# Patient Record
Sex: Male | Born: 1950
Health system: Southern US, Community
[De-identification: ages and names within clinical notes are randomized; demographics above are authoritative.]

## PROBLEM LIST (undated history)

## (undated) DIAGNOSIS — M1990 Unspecified osteoarthritis, unspecified site: Secondary | ICD-10-CM

## (undated) DIAGNOSIS — M795 Residual foreign body in soft tissue: Secondary | ICD-10-CM

## (undated) DIAGNOSIS — G4733 Obstructive sleep apnea (adult) (pediatric): Secondary | ICD-10-CM

## (undated) DIAGNOSIS — I10 Essential (primary) hypertension: Secondary | ICD-10-CM

## (undated) DIAGNOSIS — M109 Gout, unspecified: Secondary | ICD-10-CM

## (undated) DIAGNOSIS — Z9989 Dependence on other enabling machines and devices: Secondary | ICD-10-CM

## (undated) DIAGNOSIS — I509 Heart failure, unspecified: Secondary | ICD-10-CM

## (undated) DIAGNOSIS — K254 Chronic or unspecified gastric ulcer with hemorrhage: Secondary | ICD-10-CM

## (undated) SURGERY — Surgical Case
Anesthesia: *Unknown

---

## 1989-01-31 DIAGNOSIS — K254 Chronic or unspecified gastric ulcer with hemorrhage: Secondary | ICD-10-CM

## 1989-01-31 HISTORY — DX: Chronic or unspecified gastric ulcer with hemorrhage: K25.4

## 1989-01-31 HISTORY — PX: CHOLECYSTECTOMY: SHX55

## 2002-07-27 ENCOUNTER — Emergency Department (HOSPITAL_COMMUNITY): Admission: EM | Admit: 2002-07-27 | Discharge: 2002-07-27 | Payer: Self-pay | Admitting: Emergency Medicine

## 2011-05-05 ENCOUNTER — Ambulatory Visit (INDEPENDENT_AMBULATORY_CARE_PROVIDER_SITE_OTHER): Payer: Self-pay | Admitting: Surgery

## 2013-01-13 DIAGNOSIS — L723 Sebaceous cyst: Secondary | ICD-10-CM | POA: Insufficient documentation

## 2013-01-13 DIAGNOSIS — B070 Plantar wart: Secondary | ICD-10-CM | POA: Insufficient documentation

## 2013-06-08 ENCOUNTER — Inpatient Hospital Stay (HOSPITAL_COMMUNITY)
Admission: EM | Admit: 2013-06-08 | Discharge: 2013-06-12 | DRG: 287 | Disposition: A | Discharge status: Home / Self Care | Payer: Self-pay | Attending: Family Medicine | Admitting: Family Medicine

## 2013-06-08 ENCOUNTER — Encounter (HOSPITAL_COMMUNITY): Payer: Self-pay | Admitting: Cardiology

## 2013-06-08 ENCOUNTER — Emergency Department (HOSPITAL_COMMUNITY): Payer: Self-pay

## 2013-06-08 DIAGNOSIS — E785 Hyperlipidemia, unspecified: Secondary | ICD-10-CM | POA: Diagnosis present

## 2013-06-08 DIAGNOSIS — I4891 Unspecified atrial fibrillation: Secondary | ICD-10-CM

## 2013-06-08 DIAGNOSIS — E669 Obesity, unspecified: Secondary | ICD-10-CM | POA: Diagnosis present

## 2013-06-08 DIAGNOSIS — F411 Generalized anxiety disorder: Secondary | ICD-10-CM | POA: Diagnosis present

## 2013-06-08 DIAGNOSIS — D696 Thrombocytopenia, unspecified: Secondary | ICD-10-CM | POA: Diagnosis present

## 2013-06-08 DIAGNOSIS — I509 Heart failure, unspecified: Secondary | ICD-10-CM

## 2013-06-08 DIAGNOSIS — I428 Other cardiomyopathies: Secondary | ICD-10-CM | POA: Diagnosis present

## 2013-06-08 DIAGNOSIS — Z79899 Other long term (current) drug therapy: Secondary | ICD-10-CM

## 2013-06-08 DIAGNOSIS — I5033 Acute on chronic diastolic (congestive) heart failure: Principal | ICD-10-CM | POA: Diagnosis present

## 2013-06-08 DIAGNOSIS — Z7982 Long term (current) use of aspirin: Secondary | ICD-10-CM

## 2013-06-08 DIAGNOSIS — I10 Essential (primary) hypertension: Secondary | ICD-10-CM | POA: Diagnosis present

## 2013-06-08 DIAGNOSIS — Z23 Encounter for immunization: Secondary | ICD-10-CM

## 2013-06-08 DIAGNOSIS — I059 Rheumatic mitral valve disease, unspecified: Secondary | ICD-10-CM | POA: Diagnosis present

## 2013-06-08 DIAGNOSIS — I2510 Atherosclerotic heart disease of native coronary artery without angina pectoris: Secondary | ICD-10-CM | POA: Diagnosis present

## 2013-06-08 DIAGNOSIS — G4730 Sleep apnea, unspecified: Secondary | ICD-10-CM | POA: Diagnosis present

## 2013-06-08 DIAGNOSIS — G4733 Obstructive sleep apnea (adult) (pediatric): Secondary | ICD-10-CM | POA: Diagnosis present

## 2013-06-08 HISTORY — DX: Chronic or unspecified gastric ulcer with hemorrhage: K25.4

## 2013-06-08 HISTORY — DX: Heart failure, unspecified: I50.9

## 2013-06-08 HISTORY — DX: Obstructive sleep apnea (adult) (pediatric): G47.33

## 2013-06-08 HISTORY — DX: Gout, unspecified: M10.9

## 2013-06-08 HISTORY — DX: Dependence on other enabling machines and devices: Z99.89

## 2013-06-08 HISTORY — DX: Unspecified osteoarthritis, unspecified site: M19.90

## 2013-06-08 HISTORY — DX: Essential (primary) hypertension: I10

## 2013-06-08 LAB — URINALYSIS, ROUTINE W REFLEX MICROSCOPIC
Bilirubin Urine: NEGATIVE
GLUCOSE, UA: NEGATIVE mg/dL
HGB URINE DIPSTICK: NEGATIVE
Ketones, ur: NEGATIVE mg/dL
LEUKOCYTES UA: NEGATIVE
Nitrite: NEGATIVE
PH: 5 (ref 5.0–8.0)
PROTEIN: 30 mg/dL — AB
SPECIFIC GRAVITY, URINE: 1.019 (ref 1.005–1.030)
Urobilinogen, UA: 0.2 mg/dL (ref 0.0–1.0)

## 2013-06-08 LAB — TROPONIN I: Troponin I: <0.3 ng/mL (ref <0.30)

## 2013-06-08 LAB — GLUCOSE, CAPILLARY: Glucose-Capillary: 109 mg/dL — ABNORMAL HIGH (ref 70–99)

## 2013-06-08 LAB — CBC WITH DIFFERENTIAL/PLATELET
BASOS ABS: 0 10*3/uL (ref 0.0–0.1)
Basophils Relative: 0 % (ref 0–1)
EOS PCT: 1 % (ref 0–5)
Eosinophils Absolute: 0.1 10*3/uL (ref 0.0–0.7)
HCT: 42.5 % (ref 39.0–52.0)
Hemoglobin: 14.8 g/dL (ref 13.0–17.0)
Lymphocytes Relative: 17 % (ref 12–46)
Lymphs Abs: 1.2 10*3/uL (ref 0.7–4.0)
MCH: 29.9 pg (ref 26.0–34.0)
MCHC: 34.8 g/dL (ref 30.0–36.0)
MCV: 85.9 fL (ref 78.0–100.0)
Monocytes Absolute: 0.6 10*3/uL (ref 0.1–1.0)
Monocytes Relative: 9 % (ref 3–12)
NEUTROS ABS: 5.3 10*3/uL (ref 1.7–7.7)
NEUTROS PCT: 73 % (ref 43–77)
PLATELETS: 115 10*3/uL — AB (ref 150–400)
RBC: 4.95 MIL/uL (ref 4.22–5.81)
RDW: 13.7 % (ref 11.5–15.5)
WBC: 7.2 10*3/uL (ref 4.0–10.5)

## 2013-06-08 LAB — URINE MICROSCOPIC-ADD ON

## 2013-06-08 LAB — BASIC METABOLIC PANEL
BUN: 17 mg/dL (ref 6–23)
CALCIUM: 8.6 mg/dL (ref 8.4–10.5)
CHLORIDE: 106 meq/L (ref 96–112)
CO2: 25 mEq/L (ref 19–32)
Creatinine, Ser: 0.78 mg/dL (ref 0.50–1.35)
GFR calc Af Amer: >90 mL/min (ref >90)
GFR calc non Af Amer: >90 mL/min (ref >90)
GLUCOSE: 110 mg/dL — AB (ref 70–99)
POTASSIUM: 3.8 meq/L (ref 3.7–5.3)
Sodium: 145 mEq/L (ref 137–147)

## 2013-06-08 LAB — PRO B NATRIURETIC PEPTIDE: PRO B NATRI PEPTIDE: 2698 pg/mL — AB (ref 0–125)

## 2013-06-08 LAB — TSH: TSH: 3.942 u[IU]/mL (ref 0.350–4.500)

## 2013-06-08 MED ORDER — FUROSEMIDE 20 MG PO TABS: 40.0000 mg | ORAL_TABLET | Freq: Every day | ORAL | Status: DC

## 2013-06-08 MED ORDER — METOPROLOL TARTRATE 25 MG PO TABS
25.0000 mg | ORAL_TABLET | Freq: Two times a day (BID) | ORAL | Status: DC
Start: 2013-06-08 — End: 2013-06-08
  Administered 2013-06-08: 25 mg via ORAL
  Filled 2013-06-08 (×2): qty 1

## 2013-06-08 MED ORDER — SODIUM CHLORIDE 0.9 % IV SOLN: 250.0000 mL | INTRAVENOUS | Status: DC | PRN

## 2013-06-08 MED ORDER — SODIUM CHLORIDE 0.9 % IJ SOLN: 3.0000 mL | INTRAMUSCULAR | Status: DC | PRN

## 2013-06-08 MED ORDER — FUROSEMIDE 10 MG/ML IJ SOLN
40.0000 mg | Freq: Every day | INTRAMUSCULAR | Status: DC
  Administered 2013-06-08: 40 mg via INTRAVENOUS
  Filled 2013-06-08 (×2): qty 4

## 2013-06-08 MED ORDER — APIXABAN 5 MG PO TABS
5.0000 mg | ORAL_TABLET | Freq: Two times a day (BID) | ORAL | Status: DC
Start: 2013-06-08 — End: 2013-06-09
  Administered 2013-06-08 – 2013-06-09 (×3): 5 mg via ORAL
  Filled 2013-06-08 (×6): qty 1

## 2013-06-08 MED ORDER — ASPIRIN 81 MG PO CHEW
162.0000 mg | CHEWABLE_TABLET | Freq: Once | ORAL | Status: AC
  Administered 2013-06-08: 162 mg via ORAL
  Filled 2013-06-08: qty 2

## 2013-06-08 MED ORDER — PNEUMOCOCCAL VAC POLYVALENT 25 MCG/0.5ML IJ INJ
0.5000 mL | INJECTION | INTRAMUSCULAR | Status: AC
  Administered 2013-06-09: 0.5 mL via INTRAMUSCULAR
  Filled 2013-06-08: qty 0.5

## 2013-06-08 MED ORDER — ACETAMINOPHEN 325 MG PO TABS
650.0000 mg | ORAL_TABLET | ORAL | Status: DC | PRN
Start: 2013-06-08 — End: 2013-06-12

## 2013-06-08 MED ORDER — OMEGA-3-ACID ETHYL ESTERS 1 G PO CAPS
1.0000 g | ORAL_CAPSULE | Freq: Two times a day (BID) | ORAL | Status: DC
  Administered 2013-06-08 – 2013-06-12 (×8): 1 g via ORAL
  Filled 2013-06-08 (×9): qty 1

## 2013-06-08 MED ORDER — INFLUENZA VAC SPLIT QUAD 0.5 ML IM SUSP
0.5000 mL | INTRAMUSCULAR | Status: AC
  Administered 2013-06-09: 20:00:00 0.5 mL via INTRAMUSCULAR
  Filled 2013-06-08: qty 0.5

## 2013-06-08 MED ORDER — METOPROLOL SUCCINATE ER 50 MG PO TB24
50.0000 mg | ORAL_TABLET | Freq: Every day | ORAL | Status: DC
  Administered 2013-06-09: 50 mg via ORAL
  Filled 2013-06-08: qty 1

## 2013-06-08 MED ORDER — ONDANSETRON HCL 4 MG/2ML IJ SOLN
4.0000 mg | Freq: Four times a day (QID) | INTRAMUSCULAR | Status: DC | PRN
  Filled 2013-06-08: qty 2

## 2013-06-08 MED ORDER — FUROSEMIDE 40 MG PO TABS
40.0000 mg | ORAL_TABLET | Freq: Every day | ORAL | Status: DC
  Administered 2013-06-09: 11:00:00 40 mg via ORAL
  Filled 2013-06-08: qty 1

## 2013-06-08 MED ORDER — LISINOPRIL 2.5 MG PO TABS
2.5000 mg | ORAL_TABLET | Freq: Every day | ORAL | Status: DC
  Administered 2013-06-09: 2.5 mg via ORAL
  Filled 2013-06-08 (×2): qty 1

## 2013-06-08 MED ORDER — SODIUM CHLORIDE 0.9 % IJ SOLN
3.0000 mL | Freq: Two times a day (BID) | INTRAMUSCULAR | Status: DC
  Administered 2013-06-08 – 2013-06-12 (×7): 3 mL via INTRAVENOUS

## 2013-06-08 MED ORDER — ASPIRIN EC 81 MG PO TBEC
81.0000 mg | DELAYED_RELEASE_TABLET | Freq: Every day | ORAL | Status: DC
  Administered 2013-06-09: 11:00:00 81 mg via ORAL
  Filled 2013-06-08: qty 1

## 2013-06-08 NOTE — Discharge Planning (Signed)
L8HT Felicia E, Community Liaison Ext [phone]  Follow up appointment made for 07/13/13 at 3:30pm with the [facility] to establish primary care. Patient will also be obtaining the orange card during this visit, patient is aware of both appointments. Application and my contact information was given for any future questions or concerns.

## 2013-06-08 NOTE — ED Notes (Signed)
Admitting MD at the bedside.  

## 2013-06-08 NOTE — Consult Note (Signed)
Reason for Consult: CHF and Atrial Fibrillation w/ RVR Referring Physician: TRH   HPI: The patient is a 63 y/o male, with no PMH other than a PUD, who presented to the [facility] ER today for SOB and was found to be in acute CHF and atrial fibrillation with a rapid ventricular response. He does not have an established PCP and only goes to Urgent Care centers when needed. He denies any prior cardiac issues and no family history of CAD, arrythmias or CHF. He denies history of tobacco use. He does admit to a diet high in sodium. He eats a lot of processed foods, particularly hot dogs, and seasons his food with table salt. He reports that his SOB first began several weeks ago, accompanied by orthopnea/PND, weight gain (8-10 lbs), increased abdominal girth and LEE. He went to an Urgent Care and both his EKG and CXR were abnormal. He was prescribed both Metoprolol and Lasix and was instructed to establish care with a cardiologist. He states that he was waiting until his new insurance took effect before making an appointment. He took the lasix and his symptoms improve, however he recently ran out and had no refills.   He presents today with worsening SOB. EKG demonstrated atrial fibrillation with RVR. HR fluctuation in the 120s-130s. BP is stable w/ SBP in the 110s.  He denies chest pain and palpitations. He has had occasional dizziness, but no syncope/ near syncope. CXR demonstrates mild CHF w/ interstitial edema. BNP is elevated at 2,698. Troponin is negative x 1. The patient also denies any past history of CVA. He has had no further issues with PUD (not on a PPI). Denies any recent history of melena or hematochezia.    No past medical history on file.  No past surgical history on file.  Family History  Problem Relation Age of Onset  . Cancer    . Diabetes      Social History:  has no tobacco, alcohol, and drug history on file.  Allergies: No Known Allergies  Medications:  Prior to Admission  medications   Medication Sig Start Date End Date Taking? Authorizing Provider  aspirin EC 81 MG tablet Take 81 mg by mouth daily.   Yes Historical Provider, MD  furosemide (LASIX) 40 MG tablet Take 40 mg by mouth daily.   Yes Historical Provider, MD  metoprolol succinate (TOPROL-XL) 50 MG 24 hr tablet Take 50 mg by mouth daily. Take with or immediately following a meal.   Yes Historical Provider, MD  omega-3 acid ethyl esters (LOVAZA) 1 G capsule Take 1 g by mouth 2 (two) times daily.   Yes Historical Provider, MD     Results for orders placed during the hospital encounter of 06/08/13 (from the past 48 hour(s))  CBC WITH DIFFERENTIAL     Status: Abnormal   Collection Time    06/08/13  9:18 AM      Result Value Range   WBC 7.2  4.0 - 10.5 K/uL   RBC 4.95  4.22 - 5.81 MIL/uL   Hemoglobin 14.8  13.0 - 17.0 g/dL   HCT 42.5  39.0 - 52.0 %   MCV 85.9  78.0 - 100.0 fL   MCH 29.9  26.0 - 34.0 pg   MCHC 34.8  30.0 - 36.0 g/dL   RDW 13.7  11.5 - 15.5 %   Platelets 115 (*) 150 - 400 K/uL   Comment: PLATELET COUNT CONFIRMED BY SMEAR   Neutrophils Relative % 73  43 -  77 %   Neutro Abs 5.3  1.7 - 7.7 K/uL   Lymphocytes Relative 17  12 - 46 %   Lymphs Abs 1.2  0.7 - 4.0 K/uL   Monocytes Relative 9  3 - 12 %   Monocytes Absolute 0.6  0.1 - 1.0 K/uL   Eosinophils Relative 1  0 - 5 %   Eosinophils Absolute 0.1  0.0 - 0.7 K/uL   Basophils Relative 0  0 - 1 %   Basophils Absolute 0.0  0.0 - 0.1 K/uL  BASIC METABOLIC PANEL     Status: Abnormal   Collection Time    06/08/13  9:18 AM      Result Value Range   Sodium 145  137 - 147 mEq/L   Potassium 3.8  3.7 - 5.3 mEq/L   Chloride 106  96 - 112 mEq/L   CO2 25  19 - 32 mEq/L   Glucose, Bld 110 (*) 70 - 99 mg/dL   BUN 17  6 - 23 mg/dL   Creatinine, Ser 0.78  0.50 - 1.35 mg/dL   Calcium 8.6  8.4 - 10.5 mg/dL   GFR calc non Af Amer >90  >90 mL/min   GFR calc Af Amer >90  >90 mL/min   Comment: (NOTE)     The eGFR has been calculated using the CKD  EPI equation.     This calculation has not been validated in all clinical situations.     eGFR's persistently <90 mL/min signify possible Chronic Kidney     Disease.  TROPONIN I     Status: None   Collection Time    06/08/13  9:18 AM      Result Value Range   Troponin I <0.30  <0.30 ng/mL   Comment:            Due to the release kinetics of cTnI,     a negative result within the first hours     of the onset of symptoms does not rule out     myocardial infarction with certainty.     If myocardial infarction is still suspected,     repeat the test at appropriate intervals.  PRO B NATRIURETIC PEPTIDE     Status: Abnormal   Collection Time    06/08/13  9:18 AM      Result Value Range   Pro B Natriuretic peptide (BNP) 2698.0 (*) 0 - 125 pg/mL  URINALYSIS, ROUTINE W REFLEX MICROSCOPIC     Status: Abnormal   Collection Time    06/08/13  9:45 AM      Result Value Range   Color, Urine YELLOW  YELLOW   APPearance CLEAR  CLEAR   Specific Gravity, Urine 1.019  1.005 - 1.030   pH 5.0  5.0 - 8.0   Glucose, UA NEGATIVE  NEGATIVE mg/dL   Hgb urine dipstick NEGATIVE  NEGATIVE   Bilirubin Urine NEGATIVE  NEGATIVE   Ketones, ur NEGATIVE  NEGATIVE mg/dL   Protein, ur 30 (*) NEGATIVE mg/dL   Urobilinogen, UA 0.2  0.0 - 1.0 mg/dL   Nitrite NEGATIVE  NEGATIVE   Leukocytes, UA NEGATIVE  NEGATIVE  URINE MICROSCOPIC-ADD ON     Status: None   Collection Time    06/08/13  9:45 AM      Result Value Range   WBC, UA 3-6  <3 WBC/hpf   RBC / HPF 0-2  <3 RBC/hpf   Bacteria, UA RARE  RARE    Dg Chest  2 View  06/08/2013   CLINICAL DATA:  Short of breath.  Chest pain.  EXAM: CHEST  2 VIEW  COMPARISON:  None.  FINDINGS: Cardiomegaly and pulmonary vascular congestion are present with cephalization of pulmonary blood flow. No focal consolidation is identified. Thickening of the fissures is noted on the lateral view and there are Kerley B lines at the periphery of the lung, compatible with interstitial  pulmonary edema.  IMPRESSION: Mild CHF with interstitial pulmonary edema.   Electronically Signed   By: Dereck Ligas M.D.   On: 06/08/2013 10:10    Review of Systems  Respiratory: Positive for shortness of breath.   Cardiovascular: Positive for orthopnea, leg swelling and PND. Negative for chest pain and palpitations.  Gastrointestinal: Negative for blood in stool and melena.  Genitourinary: Negative for hematuria.  Neurological: Positive for dizziness. Negative for loss of consciousness.  All other systems reviewed and are negative.   Blood pressure 116/97, pulse 124, temperature 97.8 F (36.6 C), temperature source Oral, resp. rate 20, height 5' 11" (1.803 m), weight 235 lb (106.595 kg), SpO2 96.00%. Physical Exam  Constitutional: He is oriented to person, place, and time. He appears well-developed and well-nourished. No distress.  Neck: No JVD present. Carotid bruit is not present.  Cardiovascular: An irregularly irregular rhythm present. Tachycardia present.   Pulses:      Radial pulses are 2+ on the right side, and 2+ on the left side.       Dorsalis pedis pulses are 2+ on the right side, and 2+ on the left side.  Respiratory: Effort normal and breath sounds normal. No respiratory distress. He has no wheezes. He has no rales.  GI: Soft. Bowel sounds are normal. He exhibits no distension and no mass. There is no tenderness.  Musculoskeletal: He exhibits edema (trace bilateral LE).  Neurological: He is alert and oriented to person, place, and time.  Skin: Skin is warm and dry. He is not diaphoretic.  Psychiatric: He has a normal mood and affect. His behavior is normal.    Assessment/Plan: Active Problems:   Acute congestive heart failure   Acute CHF   Atrial fibrillation with rapid ventricular response  Plan: 62 y/o male with no known prior medical history, other than PUD years ago, who hasn't been followed by a PCP, presenting to ER with several weeks of SOB, orthopnea/PNA,  weight gain and LEE, found to be in CHF and rapid atrial fibrillation. Pt to be admitted by [facility]. Continue BID Lopressor. We will add Eliquis for anticoagulation. Cycle troponins x 3. Check TSH. We will order a 2 D echo to assess both systolic and diastolic function. CPAP for sleep apnea.   Also treat CHF. He does not appear extremely volume overloaded. His renal function is normal with SCr of 0.78. We will start 40 mg PO Lasix in the am. Order strict  I/Os. Daily weights. Recheck BNP in 1-2 days. Low sodium diet. Recommend consult for dietician. Also consider checking Hgb A1c and lipid panel in the am.   BRITTAINY SIMMONS, PA-C 06/08/2013, 2:33 PM   Attending Note:   The patient was seen and examined.  Agree with assessment and plan as noted above.  Changes made to the above note as needed.  1. Acute on chronic   CHF:   At this point we do not know whether this is systolic or diastolic CHF.  I suspect at least a good bit of his symptoms are due to diastolic dysfunction.   He  Has had symptoms for > 1 year.  His symptoms worsened when he ran out of his meds and did not go back to the doctor for refills.    His CXR shows cardiomegaly but no significant pulmonary edema. He does not have significant rales or peripheral edema on exam today. His exam is not very remarkable for CHF.   Restart lasix as you  Have done Restart metoprolol for rate control and CHF.  Consider ACE-inhibitor. Echo tomorrow.   He will need to follow up with me in the office after discharge.    2. Atrial Fib:  His CHADS/VASC 2 score is ~1 (CHF).  We do not have a complete assessment yet .  His glucose is only mildly elevated.  I would get a HbA1c to determine if he has DM.   3. Obesity:  He needs to work on a better diet and exercise plan.   4. OSA:  He should be placed on his own BIPAP if possible. Otherwise, he can use one from RT.    Thayer Headings, Brooke Bonito., MD, [facility] 06/08/2013, 4:51 PM

## 2013-06-08 NOTE — ED Notes (Signed)
Patient is resting comfortably. 

## 2013-06-08 NOTE — ED Notes (Signed)
Pt given [place] sandwich and coke. Waiting for admitting MD.

## 2013-06-08 NOTE — ED Notes (Signed)
Pt state he was seen at [facility] yesterday for SOB. Pt states that they did an EKG that showed he was having heart trouble. Pt tonight started having SOB and was unable to lay down to sleep.

## 2013-06-08 NOTE — H&P (Signed)
[facility] History and Physical  CLEBERT WENGER IOX:[id number] DOB: [birth year] DOA: 06/08/2013  Referring physician: EDP PCP: No PCP Per Patient   Chief Complaint: SOB FOR OVER one month  HPI: [name] is a 63 y.o. male with recently diagnosed CHF, and atrial fibrillation, was evaluated at urgent care for the above on 12/18, was discharged on b blockers and lasix, aspirin, comes in today for worsening sob, with activity, associated with orthopnea and PND. He reports mild pedal edema, which has resolved after taking lasix. He denies any chest pain, palpitations or syncope. Denies family h/o of heart disease. He denies smoking or alcohol in take. On arrival to ED, he was found to be in atrial fib with rate between 100 to 120, and his CXR revealed mild chf with interstitial edema. He is on RA with good oxygen sats.    Review of Systems:  See hpi otherwise negative.   No past medical history on file. No past surgical history on file. Social History:  has no tobacco, alcohol, and drug history on file.  No Known Allergies  No family history on file.   Prior to Admission medications   Medication Sig Start Date End Date Taking? Authorizing Provider  aspirin EC 81 MG tablet Take 81 mg by mouth daily.   Yes Historical Provider, MD  furosemide (LASIX) 40 MG tablet Take 40 mg by mouth daily.   Yes Historical Provider, MD  metoprolol succinate (TOPROL-XL) 50 MG 24 hr tablet Take 50 mg by mouth daily. Take with or immediately following a meal.   Yes Historical Provider, MD  omega-3 acid ethyl esters (LOVAZA) 1 G capsule Take 1 g by mouth 2 (two) times daily.   Yes Historical Provider, MD   Physical Exam: Filed Vitals:   06/08/13 1200  BP: 143/67  Pulse: 58  Temp:   Resp: 17    BP 143/67  Pulse 58  Temp(Src) 97.8 F (36.6 C) (Oral)  Resp 17  Ht 5\' 11"  (1.803 m)  Wt 106.595 kg (235 lb)  BMI 32.79 kg/m2  SpO2 93%  General:  Appears calm and comfortable Eyes: PERRL,  normal lids, irises & conjunctiva ENT: grossly normal hearing, lips & tongue Neck: no LAD, masses or thyromegaly Cardiovascular: irregular, no m/r/g. No LE edema. Respiratory: CTA bilaterally, no w/r/r. Normal respiratory effort. Abdomen: soft, ntnd Skin: no rash or induration seen on limited exam Musculoskeletal: grossly normal tone BUE/BLE Psychiatric: grossly normal mood and affect, speech fluent and appropriate Neurologic: grossly non-focal.          Labs on Admission:  Basic Metabolic Panel:  Recent Labs Lab 06/08/13 0918  NA 145  K 3.8  CL 106  CO2 25  GLUCOSE 110*  BUN 17  CREATININE 0.78  CALCIUM 8.6   Liver Function Tests: No results found for this basename: AST, ALT, ALKPHOS, BILITOT, PROT, ALBUMIN,  in the last 168 hours No results found for this basename: LIPASE, AMYLASE,  in the last 168 hours No results found for this basename: AMMONIA,  in the last 168 hours CBC:  Recent Labs Lab 06/08/13 0918  WBC 7.2  NEUTROABS 5.3  HGB 14.8  HCT 42.5  MCV 85.9  PLT 115*   Cardiac Enzymes:  Recent Labs Lab 06/08/13 0918  TROPONINI <0.30    BNP (last 3 results)  Recent Labs  06/08/13 0918  PROBNP 2698.0*   CBG: No results found for this basename: GLUCAP,  in the last 168 hours  Radiological Exams  on Admission: Dg Chest 2 View  06/08/2013   CLINICAL DATA:  Short of breath.  Chest pain.  EXAM: CHEST  2 VIEW  COMPARISON:  None.  FINDINGS: Cardiomegaly and pulmonary vascular congestion are present with cephalization of pulmonary blood flow. No focal consolidation is identified. Thickening of the fissures is noted on the lateral view and there are Kerley B lines at the periphery of the lung, compatible with interstitial pulmonary edema.  IMPRESSION: Mild CHF with interstitial pulmonary edema.   Electronically Signed   By: Dereck Ligas M.D.   On: 06/08/2013 10:10    EKG:  afib with rate of 107/min.  Assessment/Plan Active Problems:   Acute congestive  heart failure   Acute CHF   Acute congestive heart failure: - admit to telemetry. A fib probably driving the acute chf.  - start pt on IV lasix 40 mg daily,  - 2 D ECHO, troponins, . - cardiology consulted.   Atrial fibrillation with RVR  iv heparin,  - b blockers.   DVT prophylaxis.   Code Status: full code Family Communication: none at bedside, discussed the plan of care witht he patient.  Disposition Plan: admit to telemetry.  Time spent: 65 minutes.  [facility] Pager [phone]

## 2013-06-08 NOTE — ED Notes (Signed)
Report given to Emily, RN.

## 2013-06-09 ENCOUNTER — Encounter (HOSPITAL_COMMUNITY): Payer: Self-pay | Admitting: General Practice

## 2013-06-09 DIAGNOSIS — I059 Rheumatic mitral valve disease, unspecified: Secondary | ICD-10-CM

## 2013-06-09 DIAGNOSIS — I10 Essential (primary) hypertension: Secondary | ICD-10-CM | POA: Diagnosis present

## 2013-06-09 DIAGNOSIS — E785 Hyperlipidemia, unspecified: Secondary | ICD-10-CM | POA: Diagnosis present

## 2013-06-09 DIAGNOSIS — G4730 Sleep apnea, unspecified: Secondary | ICD-10-CM | POA: Diagnosis present

## 2013-06-09 LAB — LIPID PANEL
CHOL/HDL RATIO: 7.3 ratio
Cholesterol: 160 mg/dL (ref 0–200)
HDL: 22 mg/dL — ABNORMAL LOW (ref >39)
LDL CALC: 95 mg/dL (ref 0–99)
TRIGLYCERIDES: 214 mg/dL — AB (ref <150)
VLDL: 43 mg/dL — AB (ref 0–40)

## 2013-06-09 LAB — HEPATIC FUNCTION PANEL
ALBUMIN: 3.1 g/dL — AB (ref 3.5–5.2)
ALK PHOS: 68 U/L (ref 39–117)
ALT: 29 U/L (ref 0–53)
AST: 34 U/L (ref 0–37)
BILIRUBIN TOTAL: 0.7 mg/dL (ref 0.3–1.2)
Bilirubin, Direct: <0.2 mg/dL (ref 0.0–0.3)
TOTAL PROTEIN: 5.8 g/dL — AB (ref 6.0–8.3)

## 2013-06-09 LAB — MAGNESIUM: Magnesium: 2.2 mg/dL (ref 1.5–2.5)

## 2013-06-09 LAB — COMPREHENSIVE METABOLIC PANEL
ALBUMIN: 3.1 g/dL — AB (ref 3.5–5.2)
ALT: 29 U/L (ref 0–53)
AST: 34 U/L (ref 0–37)
Alkaline Phosphatase: 67 U/L (ref 39–117)
BILIRUBIN TOTAL: 0.7 mg/dL (ref 0.3–1.2)
BUN: 15 mg/dL (ref 6–23)
CHLORIDE: 101 meq/L (ref 96–112)
CO2: 19 mEq/L (ref 19–32)
Calcium: 8.4 mg/dL (ref 8.4–10.5)
Creatinine, Ser: 0.74 mg/dL (ref 0.50–1.35)
GFR calc Af Amer: >90 mL/min (ref >90)
GFR calc non Af Amer: >90 mL/min (ref >90)
Glucose, Bld: 98 mg/dL (ref 70–99)
Potassium: 3.5 mEq/L — ABNORMAL LOW (ref 3.7–5.3)
Sodium: 139 mEq/L (ref 137–147)
Total Protein: 5.9 g/dL — ABNORMAL LOW (ref 6.0–8.3)

## 2013-06-09 LAB — GLUCOSE, CAPILLARY: Glucose-Capillary: 100 mg/dL — ABNORMAL HIGH (ref 70–99)

## 2013-06-09 LAB — PRO B NATRIURETIC PEPTIDE: PRO B NATRI PEPTIDE: 1919 pg/mL — AB (ref 0–125)

## 2013-06-09 LAB — TROPONIN I

## 2013-06-09 MED ORDER — ATORVASTATIN CALCIUM 40 MG PO TABS
40.0000 mg | ORAL_TABLET | Freq: Every day | ORAL | Status: DC
  Administered 2013-06-09 – 2013-06-11 (×3): 40 mg via ORAL
  Filled 2013-06-09 (×4): qty 1

## 2013-06-09 MED ORDER — BUSPIRONE HCL 5 MG PO TABS
5.0000 mg | ORAL_TABLET | Freq: Two times a day (BID) | ORAL | Status: DC
  Administered 2013-06-09 – 2013-06-12 (×7): 5 mg via ORAL
  Filled 2013-06-09 (×8): qty 1

## 2013-06-09 MED ORDER — METOPROLOL TARTRATE 50 MG PO TABS
50.0000 mg | ORAL_TABLET | Freq: Two times a day (BID) | ORAL | Status: DC
  Administered 2013-06-09 – 2013-06-10 (×2): 50 mg via ORAL
  Filled 2013-06-09 (×4): qty 1

## 2013-06-09 MED ORDER — FUROSEMIDE 40 MG PO TABS
40.0000 mg | ORAL_TABLET | Freq: Two times a day (BID) | ORAL | Status: DC
  Filled 2013-06-09 (×2): qty 1

## 2013-06-09 MED ORDER — SODIUM CHLORIDE 0.9 % IV SOLN: 250.0000 mL | INTRAVENOUS | Status: DC | PRN

## 2013-06-09 MED ORDER — ZOLPIDEM TARTRATE 5 MG PO TABS
5.0000 mg | ORAL_TABLET | Freq: Once | ORAL | Status: AC
  Administered 2013-06-09: 5 mg via ORAL
  Filled 2013-06-09: qty 1

## 2013-06-09 MED ORDER — DIAZEPAM 5 MG PO TABS: 5.0000 mg | ORAL_TABLET | ORAL | Status: DC

## 2013-06-09 MED ORDER — POTASSIUM CHLORIDE CRYS ER 20 MEQ PO TBCR
20.0000 meq | EXTENDED_RELEASE_TABLET | Freq: Two times a day (BID) | ORAL | Status: DC
  Administered 2013-06-09 – 2013-06-12 (×6): 20 meq via ORAL
  Filled 2013-06-09 (×9): qty 1

## 2013-06-09 MED ORDER — POTASSIUM CHLORIDE CRYS ER 20 MEQ PO TBCR: 40.0000 meq | EXTENDED_RELEASE_TABLET | Freq: Once | ORAL | Status: DC

## 2013-06-09 MED ORDER — APIXABAN 5 MG PO TABS
5.0000 mg | ORAL_TABLET | Freq: Two times a day (BID) | ORAL | Status: DC
  Filled 2013-06-09: qty 1

## 2013-06-09 MED ORDER — ZOLPIDEM TARTRATE 5 MG PO TABS
5.0000 mg | ORAL_TABLET | Freq: Every evening | ORAL | Status: DC | PRN
  Administered 2013-06-10 – 2013-06-11 (×2): 5 mg via ORAL
  Filled 2013-06-09 (×3): qty 1

## 2013-06-09 MED ORDER — ASPIRIN 81 MG PO CHEW
81.0000 mg | CHEWABLE_TABLET | ORAL | Status: AC
  Administered 2013-06-10: 05:00:00 81 mg via ORAL
  Filled 2013-06-09: qty 1

## 2013-06-09 MED ORDER — SODIUM CHLORIDE 0.45 % IV SOLN
INTRAVENOUS | Status: DC
  Administered 2013-06-10: 05:00:00 via INTRAVENOUS

## 2013-06-09 MED ORDER — POTASSIUM CHLORIDE CRYS ER 20 MEQ PO TBCR
40.0000 meq | EXTENDED_RELEASE_TABLET | Freq: Once | ORAL | Status: AC
  Administered 2013-06-09: 16:00:00 40 meq via ORAL
  Filled 2013-06-09: qty 2

## 2013-06-09 MED ORDER — SODIUM CHLORIDE 0.9 % IJ SOLN: 3.0000 mL | Freq: Two times a day (BID) | INTRAMUSCULAR | Status: DC

## 2013-06-09 MED ORDER — SODIUM CHLORIDE 0.9 % IJ SOLN
3.0000 mL | INTRAMUSCULAR | Status: DC | PRN
  Administered 2013-06-10: 3 mL via INTRAVENOUS

## 2013-06-09 MED ORDER — LORAZEPAM 0.5 MG PO TABS
0.5000 mg | ORAL_TABLET | Freq: Once | ORAL | Status: AC
  Administered 2013-06-10: 0.5 mg via ORAL
  Filled 2013-06-09: qty 1

## 2013-06-09 NOTE — Progress Notes (Signed)
1/8/1501900-0700 Patient A/Ox4 and is independent. He is on 2 L [place] of oxygen at night for sleep. Patient was offered a CPAP however patient declined CPAP because of the full mask. He had no c/o pain. He requested something for sleep so prn Ambien was ordered. Pt.had 5 beat run of v-tach during the shift. Pt.was asymptomatic. VS were documented. Mid-level on call with [facility] was notified. New order was written.

## 2013-06-09 NOTE — Care Management Note (Signed)
    Page 1 of 1   06/09/2013     5:44:40 PM   CARE MANAGEMENT NOTE 06/09/2013  Patient:  [name], [name]   Account Number:  [id number]  Date Initiated:  06/09/2013  Documentation initiated by:  Mariann Laster  Subjective/Objective Assessment:   Admitted with A-Fib     Action/Plan:   CM will monitor for disposition needs   Anticipated DC Date:  06/12/2013   Anticipated DC Plan:  [place]  Other      Choice offered to / List presented to:             Status of service:  In process, will continue to follow Medicare Important Message given?   (If response is "NO", the following Medicare IM given date fields will be blank) Date Medicare IM given:   Date Additional Medicare IM given:    Discharge Disposition:    Per UR Regulation:  Reviewed for med. necessity/level of care/duration of stay  If discussed at [place] of Stay Meetings, dates discussed:    Comments:  06/09/2013 Last UR Date 06/09/2013 [place] Check:  Eliquis 10mg  BID ALL MEDICAID PT.HAS A $3.00 CO-PAY FOR MEDS. Medicaid Potential:  approval status unknown at this time. Dispositon Plan: F/U F.A. re:  MCD status. Provide patient with Eliquis 30 Day Supply Card at d/c if MD orders. Crystal Hutchinson RN, BSN, MSHL, CCM

## 2013-06-09 NOTE — Progress Notes (Signed)
    Subjective:  Denies CP; mild dyspnea.   Objective:  Filed Vitals:   06/09/13 0720 06/09/13 1031 06/09/13 1036 06/09/13 1311  BP:  139/79  117/87  Pulse:  128 130 115  Temp:  98.2 F (36.8 C)  98.1 F (36.7 C)  TempSrc:  Oral  Oral  Resp: 18 18  22   Height:      Weight:      SpO2:  97%  100%    Intake/Output from previous day:  Intake/Output Summary (Last 24 hours) at 06/09/13 1322 Last data filed at 06/09/13 0900  Gross per 24 hour  Intake   1063 ml  Output      0 ml  Net   1063 ml    Physical Exam: Physical exam: Well-developed well-nourished in no acute distress.  Skin is warm and dry.  HEENT is normal.  Neck is supple.  Chest is clear to auscultation with normal expansion.  Cardiovascular exam is tachycardic and irregular` Abdominal exam nontender or distended. No masses palpated. Extremities show no edema. neuro grossly intact    Lab Results: Basic Metabolic Panel:  Recent Labs  06/08/13 0918 06/09/13 0238  NA 145 139  K 3.8 3.5*  CL 106 101  CO2 25 19  GLUCOSE 110* 98  BUN 17 15  CREATININE 0.78 0.74  CALCIUM 8.6 8.4  MG  --  2.2   CBC:  Recent Labs  06/08/13 0918  WBC 7.2  NEUTROABS 5.3  HGB 14.8  HCT 42.5  MCV 85.9  PLT 115*   Cardiac Enzymes:  Recent Labs  06/08/13 1601 06/08/13 2005 06/09/13 0238  TROPONINI <0.30 <0.30 <0.30     Assessment/Plan:  1 atrial fibrillation-the patient remains in atrial fibrillation. His rate is elevated. Increase metoprolol to 50 mg by mouth twice a day. Continue apixaban. TSH normal. Await results of echocardiogram. Can consider cardioverting after he has been anticoagulated for 3 consecutive weeks. 2 acute diastolic congestive heart failure-the patient remains symptomatic. Change Lasix to 40 mg twice a day. Follow renal function. Congestive heart failure most likely related to atrial fibrillation. He has been tachycardic and I wonder if he has a tachycardia mediated cardiomyopathy. Echo  is pending. I will hold ACE inhibitor to allow more BP room to increase AV node blocking agents.  Kirk Ruths 06/09/2013, 1:22 PM

## 2013-06-09 NOTE — Progress Notes (Signed)
[name]:[id number] DOB: Dec 19, 1950 DOA: 06/08/2013 PCP: No PCP Per Patient  Brief narrative: 63 y/o ?, benign past medical history, admitted with new onset CHF, paroxysmal atrial fibrillation, [place] score 1. Recently seen at [facility] urgent care by FNP, treated for bronchitis returned and was noted to have high blood pressure as well as increasing fluid given one week course of on metoprolol, Lasix which he ran out of.  Past medical history-As per Problem list Chart reviewed as below- see above  Consultants:   Cardiology  Procedures:   Echo pending  Antibiotics:   None   Subjective   feels well. 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 Over the same month time he has had lower strength is swelling some extent He does not have any chest pain he cannot tell me if he's had any palpitations recently. He does admit to dietary noncompliance but otherwise has no real medical illnesses   Objective    Interim History:  None  Telemetry:  Atrial fibrillation rates 120 to 1:30 while seated   Objective: Filed Vitals:   06/08/13 2019 06/09/13 0157 06/09/13 0515 06/09/13 0720  BP: 121/74 101/68 141/67   Pulse: 97 97 107   Temp: 97.5 F (36.4 C) 97.4 F (36.3 C) 98 F (36.7 C)   TempSrc: Oral Oral Oral   Resp: 18 20 20 18   Height:      Weight:   100.608 kg (221 lb 12.8 oz)   SpO2: 97% 98% 98%     Intake/Output Summary (Last 24 hours) at 06/09/13 1028 Last data filed at 06/09/13 0900  Gross per 24 hour  Intake   1063 ml  Output      0 ml  Net   1063 ml    Exam:  General:  EOMI, NCAT, no murmur rub or gallop, no JVD, no bruit Cardiovascular:  S1-S2 irregularly irregular Respiratory:  Clear no egophony no fremitus Abdomen:  Soft nontender Skin no lower extremity edema Neuro intact  Data  Reviewed: Basic Metabolic Panel:  Recent Labs Lab 06/08/13 0918 06/09/13 0238  NA 145 139  K 3.8 3.5*  CL 106 101  CO2 25 19  GLUCOSE 110* 98  BUN 17 15  CREATININE 0.78 0.74  CALCIUM 8.6 8.4  MG  --  2.2   Liver Function Tests:  Recent Labs Lab 06/09/13 0238  AST 34  ALT 29  ALKPHOS 67  BILITOT 0.7  PROT 5.9*  ALBUMIN 3.1*   No results found for this basename: LIPASE, AMYLASE,  in the last 168 hours No results found for this basename: AMMONIA,  in the last 168 hours CBC:  Recent Labs Lab 06/08/13 0918  WBC 7.2  NEUTROABS 5.3  HGB 14.8  HCT 42.5  MCV 85.9  PLT 115*   Cardiac Enzymes:  Recent Labs Lab 06/08/13 0918 06/08/13 1601 06/08/13 2005 06/09/13 0238  TROPONINI <0.30 <0.30 <0.30 <0.30   BNP: No components found with this basename: POCBNP,  CBG:  Recent Labs Lab 06/08/13 2120 06/09/13 0629  GLUCAP 109* 100*    No results found for this or any previous visit (from the past 240 hour(s)).   Studies:              All Imaging reviewed and is as per above notation  Scheduled Meds: . apixaban  5 mg Oral BID  . aspirin EC  81 mg Oral Daily  . furosemide  40 mg Oral Daily  . influenza vac split quadrivalent PF  0.5 mL Intramuscular Tomorrow-1000  . lisinopril  2.5 mg Oral Daily  . metoprolol succinate  50 mg Oral Daily  . omega-3 acid ethyl esters  1 g Oral BID  . pneumococcal 23 valent vaccine  0.5 mL Intramuscular Tomorrow-1000  . sodium chloride  3 mL Intravenous Q12H   Continuous Infusions:    Assessment/Plan: 1.  Acute decompensated likely diastolic heart failure-proBNP 1900 echocardiogram pending. Further care as per cardiology. I/O. not accurate daily weights. Depending on his echo, will add or minus medications for now continue IV Lasix 40 mg daily 2. P Afib, Chad2vasc 1- started on Elliquis? cardiology to determine whether he is a candidate for chemical vs. electrical cardioversion. Await echo. Consider uptitrate metoprolol to  50 mg twice a day-blood pressure can probably support this-for now continue aspirin 81 mg daily 3. Hypertension-continue ACE inhibitor low dose 2.5 mg daily in addition to above medications 4. Hyperlipidemia-needs dietary education, triglycerides 214, LDL 95, needs at least moderate intensity statin, Lipitor 40 5. Mild thrombocytopenia-repeat labs a.m.. If concurrent will workup   Code Status:  Full Family Communication:  Long discussion with patient at bedside. Briefly updated son who came to bedside Disposition Plan:  Inpatient pending cardiology reevaluation   Verneita Griffes, MD  [facility] Pager [phone] 06/09/2013, 10:28 AM    LOS: 1 day

## 2013-06-09 NOTE — Progress Notes (Signed)
Utilization review completed. Aadin Gaut, RN, BSN. 

## 2013-06-09 NOTE — Progress Notes (Signed)
Agree Casper Pagliuca  

## 2013-06-09 NOTE — Progress Notes (Signed)
Echo results noted, EF 25-30%. Discussed with Dr Stanford Breed- plan cath in am. Discussed procedure and risks with pt and he is agreeable to proceed. On exam I had a hard time finding Rt radial pulse, he has previously broken that arm. He has a good Lt radial pulse. There are no femoral bruits and he has 2+/4 posterior tibial pulses bilaterally. No history of contrast allergy.  He has had 3 doses of Eliquis - last dose was 10:30 this am, he will not get tonight's dose or tomorrow's AM dose, I did write for it to resume tomorrow PM. Hold Lasix in AM, will not agressively hydrate pre cath.  Kerin Ransom PA-C 06/09/2013 4:25 PM

## 2013-06-09 NOTE — Discharge Instructions (Signed)
Information on my medicine - ELIQUIS (apixaban)  This medication education was reviewed with me or my healthcare representative as part of my discharge preparation.  The pharmacist that spoke with me during my hospital stay was:  Wayland Salinas, [facility]  Why was Eliquis prescribed for you? Eliquis was prescribed for you to reduce the risk of forming blood clots that can cause a stroke if you have a medical condition called atrial fibrillation (a type of irregular heartbeat) OR to reduce the risk of a blood clots forming after orthopedic surgery.  What do You need to know about Eliquis ? Take your Eliquis TWICE DAILY - one tablet in the morning and one tablet in the evening with or without food.  It would be best to take the doses about the same time each day.  If you have difficulty swallowing the tablet whole please discuss with your pharmacist how to take the medication safely.  Take Eliquis exactly as prescribed by your doctor and DO NOT stop taking Eliquis without talking to the doctor who prescribed the medication.  Stopping may increase your risk of developing a new clot or stroke.  Refill your prescription before you run out.  After discharge, you should have regular check-up appointments with your healthcare provider that is prescribing your Eliquis.  In the future your dose may need to be changed if your kidney function or weight changes by a significant amount or as you get older.  What do you do if you miss a dose? If you miss a dose, take it as soon as you remember on the same day and resume taking twice daily.  Do not take more than one dose of ELIQUIS at the same time.  Important Safety Information A possible side effect of Eliquis is bleeding. You should call your healthcare provider right away if you experience any of the following:   Bleeding from an injury or your nose that does not stop.   Unusual colored urine (red or dark brown) or unusual colored stools  (red or black).   Unusual bruising for unknown reasons.   A serious fall or if you hit your head (even if there is no bleeding).  Some medicines may interact with Eliquis and might increase your risk of bleeding or clotting while on Eliquis. To help avoid this, consult your healthcare provider or pharmacist prior to using any new prescription or non-prescription medications, including herbals, vitamins, non-steroidal anti-inflammatory drugs (NSAIDs) and supplements.  This website has more information on Eliquis (apixaban): [email or web address].

## 2013-06-09 NOTE — Progress Notes (Signed)
Echocardiogram 2D Echocardiogram has been performed.  [name] 06/09/2013, 10:12 AM

## 2013-06-09 NOTE — Progress Notes (Signed)
Patient refused CPAP at this time. States he does have machine at home and has been compliant with it until recently when he was diagnosed with heart problems and now "it just doesn't feel right".  Patient states he will follow up with the the appropriate person regarding the issue with his home CPAP.  Patient encouraged to call for Respiratory if he would like to wear CPAP during his hospital stay.

## 2013-06-09 NOTE — Progress Notes (Signed)
Subjective:  SOB improved but he did have some SOB after lunch, resolved spontaneously.  Objective:  Vital Signs in the last 24 hours: Temp:  [97.4 F (36.3 C)-98.2 F (36.8 C)] 98.1 F (36.7 C) (01/08 1311) Pulse Rate:  [47-130] 115 (01/08 1311) Resp:  [18-22] 22 (01/08 1311) BP: (101-141)/(67-97) 117/87 mmHg (01/08 1311) SpO2:  [91 %-100 %] 100 % (01/08 1311) Weight:  [220 lb 10.9 oz (100.1 kg)-221 lb 12.8 oz (100.608 kg)] 221 lb 12.8 oz (100.608 kg) (01/08 0515)  Intake/Output from previous day:  Intake/Output Summary (Last 24 hours) at 06/09/13 1345 Last data filed at 06/09/13 0900  Gross per 24 hour  Intake   1063 ml  Output      0 ml  Net   1063 ml    Physical Exam: General appearance: alert, cooperative, no distress and moderately obese Lungs: clear to auscultation bilaterally Heart: irregularly irregular rhythm Extremities: no edema   Rate: 112  Rhythm: atrial fibrillation  Lab Results:  Recent Labs  06/08/13 0918  WBC 7.2  HGB 14.8  PLT 115*    Recent Labs  06/08/13 0918 06/09/13 0238  NA 145 139  K 3.8 3.5*  CL 106 101  CO2 25 19  GLUCOSE 110* 98  BUN 17 15  CREATININE 0.78 0.74    Recent Labs  06/08/13 2005 06/09/13 0238  TROPONINI <0.30 <0.30   No results found for this basename: INR,  in the last 72 hours  Imaging: Imaging results have been reviewed  Cardiac Studies:  Assessment/Plan:   Principal Problem:   Acute CHF- etiology not yet determined Active Problems:   Atrial fibrillation with rapid ventricular response   Sleep apnea- on C-pap   HTN (hypertension)   Dyslipidemia- HDL 22, LDL 95    PLAN: Add K+. Echo report pending. Wgt down 14 lbs ?  Kerin Ransom PA-C Beeper [phone] 06/09/2013, 1:45 PM   See note earlier in day Kirk Ruths

## 2013-06-10 ENCOUNTER — Encounter (HOSPITAL_COMMUNITY)
Admission: EM | Disposition: A | Discharge status: Home / Self Care | Payer: Self-pay | Source: Home / Self Care | Attending: Family Medicine

## 2013-06-10 HISTORY — PX: LEFT HEART CATHETERIZATION WITH CORONARY ANGIOGRAM: SHX5451

## 2013-06-10 LAB — RENAL FUNCTION PANEL
ALBUMIN: 3.2 g/dL — AB (ref 3.5–5.2)
BUN: 14 mg/dL (ref 6–23)
CALCIUM: 8.5 mg/dL (ref 8.4–10.5)
CO2: 24 meq/L (ref 19–32)
Chloride: 106 mEq/L (ref 96–112)
Creatinine, Ser: 0.75 mg/dL (ref 0.50–1.35)
GFR calc Af Amer: >90 mL/min (ref >90)
Glucose, Bld: 103 mg/dL — ABNORMAL HIGH (ref 70–99)
Phosphorus: 3.2 mg/dL (ref 2.3–4.6)
Potassium: 4.3 mEq/L (ref 3.7–5.3)
Sodium: 141 mEq/L (ref 137–147)

## 2013-06-10 LAB — CBC WITH DIFFERENTIAL/PLATELET
Basophils Absolute: 0 10*3/uL (ref 0.0–0.1)
Basophils Relative: 0 % (ref 0–1)
EOS ABS: 0.1 10*3/uL (ref 0.0–0.7)
Eosinophils Relative: 2 % (ref 0–5)
HCT: 41.2 % (ref 39.0–52.0)
HEMOGLOBIN: 14.5 g/dL (ref 13.0–17.0)
Lymphocytes Relative: 22 % (ref 12–46)
Lymphs Abs: 1.6 10*3/uL (ref 0.7–4.0)
MCH: 30.8 pg (ref 26.0–34.0)
MCHC: 35.2 g/dL (ref 30.0–36.0)
MCV: 87.5 fL (ref 78.0–100.0)
Monocytes Absolute: 0.7 10*3/uL (ref 0.1–1.0)
Monocytes Relative: 9 % (ref 3–12)
NEUTROS PCT: 67 % (ref 43–77)
Neutro Abs: 5.1 10*3/uL (ref 1.7–7.7)
Platelets: 112 10*3/uL — ABNORMAL LOW (ref 150–400)
RBC: 4.71 MIL/uL (ref 4.22–5.81)
RDW: 13.9 % (ref 11.5–15.5)
WBC: 7.5 10*3/uL (ref 4.0–10.5)

## 2013-06-10 LAB — PROTIME-INR
INR: 1.08 (ref 0.00–1.49)
Prothrombin Time: 13.8 seconds (ref 11.6–15.2)

## 2013-06-10 SURGERY — LEFT HEART CATHETERIZATION WITH CORONARY ANGIOGRAM
Anesthesia: LOCAL

## 2013-06-10 MED ORDER — METOPROLOL TARTRATE 50 MG PO TABS
75.0000 mg | ORAL_TABLET | Freq: Two times a day (BID) | ORAL | Status: DC
  Administered 2013-06-10 – 2013-06-12 (×4): 75 mg via ORAL
  Filled 2013-06-10 (×5): qty 1

## 2013-06-10 MED ORDER — SODIUM CHLORIDE 0.9 % IJ SOLN: 3.0000 mL | INTRAMUSCULAR | Status: DC | PRN

## 2013-06-10 MED ORDER — FUROSEMIDE 10 MG/ML IJ SOLN
40.0000 mg | Freq: Two times a day (BID) | INTRAMUSCULAR | Status: DC
  Administered 2013-06-10 – 2013-06-12 (×4): 40 mg via INTRAVENOUS
  Filled 2013-06-10 (×4): qty 4

## 2013-06-10 MED ORDER — SODIUM CHLORIDE 0.9 % IJ SOLN
3.0000 mL | Freq: Two times a day (BID) | INTRAMUSCULAR | Status: DC
  Administered 2013-06-10: 3 mL via INTRAVENOUS

## 2013-06-10 MED ORDER — HEPARIN (PORCINE) IN NACL 2-0.9 UNIT/ML-% IJ SOLN
INTRAMUSCULAR | Status: AC
  Filled 2013-06-10: qty 1000

## 2013-06-10 MED ORDER — APIXABAN 5 MG PO TABS
5.0000 mg | ORAL_TABLET | Freq: Two times a day (BID) | ORAL | Status: DC
  Administered 2013-06-11 – 2013-06-12 (×2): 5 mg via ORAL
  Filled 2013-06-10 (×3): qty 1

## 2013-06-10 MED ORDER — ACETAMINOPHEN 325 MG PO TABS: 650.0000 mg | ORAL_TABLET | ORAL | Status: DC | PRN

## 2013-06-10 MED ORDER — MIDAZOLAM HCL 2 MG/2ML IJ SOLN
INTRAMUSCULAR | Status: AC
  Filled 2013-06-10: qty 2

## 2013-06-10 MED ORDER — LIDOCAINE HCL (PF) 1 % IJ SOLN
INTRAMUSCULAR | Status: AC
  Filled 2013-06-10: qty 30

## 2013-06-10 MED ORDER — DIPHENHYDRAMINE HCL 25 MG PO CAPS
25.0000 mg | ORAL_CAPSULE | Freq: Three times a day (TID) | ORAL | Status: DC | PRN
  Administered 2013-06-10 – 2013-06-11 (×2): 25 mg via ORAL
  Filled 2013-06-10 (×2): qty 1

## 2013-06-10 MED ORDER — ONDANSETRON HCL 4 MG/2ML IJ SOLN: 4.0000 mg | Freq: Four times a day (QID) | INTRAMUSCULAR | Status: DC | PRN

## 2013-06-10 MED ORDER — NITROGLYCERIN 0.2 MG/ML ON CALL CATH LAB
INTRAVENOUS | Status: AC
  Filled 2013-06-10: qty 1

## 2013-06-10 MED ORDER — FENTANYL CITRATE 0.05 MG/ML IJ SOLN
INTRAMUSCULAR | Status: AC
  Filled 2013-06-10: qty 2

## 2013-06-10 MED ORDER — SODIUM CHLORIDE 0.9 % IV SOLN: 250.0000 mL | INTRAVENOUS | Status: DC | PRN

## 2013-06-10 NOTE — Progress Notes (Signed)
Subjective: NO CP.  Some SOB last night.    Objective: Vital signs in last 24 hours: Temp:  [97.3 F (36.3 C)-98.2 F (36.8 C)] 97.5 F (36.4 C) (01/09 0535) Pulse Rate:  [69-130] 69 (01/09 0537) Resp:  [18-22] 19 (01/09 0537) BP: (94-139)/(73-112) 122/73 mmHg (01/09 0537) SpO2:  [96 %-100 %] 97 % (01/09 0537) Weight:  [222 lb 3.6 oz (100.8 kg)] 222 lb 3.6 oz (100.8 kg) (01/09 0535) Last BM Date: 06/07/13  Intake/Output from previous day: 01/08 0701 - 01/09 0700 In: 806 [P.O.:800; I.V.:6] Out: 100 [Urine:100] Intake/Output this shift:    Medications Current Facility-Administered Medications  Medication Dose Route Frequency Provider Last Rate Last Dose  . 0.45 % sodium chloride infusion   Intravenous Continuous Erlene Quan, PA-C 50 mL/hr at 06/10/13 0515    . 0.9 %  sodium chloride infusion  250 mL Intravenous PRN Hosie Poisson, MD      . 0.9 %  sodium chloride infusion  250 mL Intravenous PRN Erlene Quan, PA-C      . acetaminophen (TYLENOL) tablet 650 mg  650 mg Oral Q4H PRN Hosie Poisson, MD      . apixaban (ELIQUIS) tablet 5 mg  5 mg Oral BID Luke K Kilroy, PA-C      . atorvastatin (LIPITOR) tablet 40 mg  40 mg Oral q1800 Nita Sells, MD   40 mg at 06/09/13 1607  . busPIRone (BUSPAR) tablet 5 mg  5 mg Oral BID Nita Sells, MD   5 mg at 06/09/13 2129  . diazepam (VALIUM) tablet 5 mg  5 mg Oral On Call Luke K Kilroy, PA-C      . diphenhydrAMINE (BENADRYL) capsule 25 mg  25 mg Oral Q8H PRN Nita Sells, MD      . furosemide (LASIX) tablet 40 mg  40 mg Oral BID Luke K Kilroy, PA-C      . metoprolol (LOPRESSOR) tablet 50 mg  50 mg Oral BID Lelon Perla, MD   50 mg at 06/09/13 2128  . omega-3 acid ethyl esters (LOVAZA) capsule 1 g  1 g Oral BID Hosie Poisson, MD   1 g at 06/09/13 2128  . ondansetron (ZOFRAN) injection 4 mg  4 mg Intravenous Q6H PRN Hosie Poisson, MD      . potassium chloride SA (K-DUR,KLOR-CON) CR tablet 20 mEq  20 mEq Oral BID Erlene Quan, PA-C   20 mEq at 06/09/13 2132  . sodium chloride 0.9 % injection 3 mL  3 mL Intravenous Q12H Hosie Poisson, MD   3 mL at 06/09/13 2129  . sodium chloride 0.9 % injection 3 mL  3 mL Intravenous PRN Hosie Poisson, MD      . sodium chloride 0.9 % injection 3 mL  3 mL Intravenous Q12H Luke K Kilroy, PA-C      . sodium chloride 0.9 % injection 3 mL  3 mL Intravenous PRN Erlene Quan, PA-C   3 mL at 06/10/13 0516  . zolpidem (AMBIEN) tablet 5 mg  5 mg Oral QHS PRN Dianne Dun, NP        PE: General appearance: alert, cooperative and no distress Lungs: clear to auscultation bilaterally Heart: irregularly irregular rhythm Extremities: Trace LEE Pulses: 1+ right radial.  2+ left.  1+ DPs. Skin: Warm and dry Neurologic: Grossly normal  Lab Results:   Recent Labs  06/08/13 0918 06/10/13 0500  WBC 7.2 7.5  HGB 14.8 14.5  HCT 42.5 41.2  PLT 115* 112*   BMET  Recent Labs  06/08/13 0918 06/09/13 0238 06/10/13 0500  NA 145 139 141  K 3.8 3.5* 4.3  CL 106 101 106  CO2 25 19 24  GLUCOSE 110* 98 103*  BUN 17 15 14  CREATININE 0.78 0.74 0.75  CALCIUM 8.6 8.4 8.5   PT/INR  Recent Labs  06/10/13 0500  LABPROT 13.8  INR 1.08   Cholesterol  Recent Labs  06/09/13 0238  CHOL 160   Lipid Panel     Component Value Date/Time   CHOL 160 06/09/2013 0238   TRIG 214* 06/09/2013 0238   HDL 22* 06/09/2013 0238   CHOLHDL 7.3 06/09/2013 0238   VLDL 43* 06/09/2013 0238   LDLCALC 95 06/09/2013 0238     Studies/Results: @RISRSLT2@   Assessment/Plan    Principal Problem:   Acute CHF- etiology not yet determined Active Problems:   Atrial fibrillation with rapid ventricular response   Sleep apnea- on C-pap   HTN (hypertension)   Dyslipidemia- HDL 22, LDL 95  Plan:  EF 25-30%, mild to moderate MV regurg, severe LA dilation.  LHC today.  ELIQUIS  Held last night.   Maintaining afib with rate of 106.  BP stable.   Meds:  Lipitor, lasix 40mg PO BID, lopressor 50 BID,  Lovaza.  Lipid panel looks terrible.  I discussed decreased carbohydrate intake.  The patient puts 4 cups of sugar in a gallon of tea!  We also talked about sodium restriction and daily weights.     LOS: 2 days    HAGER, BRYAN 06/10/2013 9:04 AM  As above; patient seen and examined; some orthopnea but no chest pain; Echo shows EF 25-30; may have tachycardia mediated CM; plan cath today to exclude CAD (risks and benefits discussed and patient agrees to proceed); if no CAD, resume apixaban in AM; increase lopressor to 75 BID for rate control; continue lasix and follow renal function; add ACEI later if BP allows (once HR controlled); would plan DCCV 3 weeks after uninterrupted anticoagulation (or TEE guided if pt remains symptomatic despite diuresis or HR difficult to control). Brian Crenshaw  

## 2013-06-10 NOTE — H&P (View-Only) (Signed)
Subjective: NO CP.  Some SOB last night.    Objective: Vital signs in last 24 hours: Temp:  [97.3 F (36.3 C)-98.2 F (36.8 C)] 97.5 F (36.4 C) (01/09 0535) Pulse Rate:  [69-130] 69 (01/09 0537) Resp:  [18-22] 19 (01/09 0537) BP: (94-139)/(73-112) 122/73 mmHg (01/09 0537) SpO2:  [96 %-100 %] 97 % (01/09 0537) Weight:  [222 lb 3.6 oz (100.8 kg)] 222 lb 3.6 oz (100.8 kg) (01/09 0535) Last BM Date: 06/07/13  Intake/Output from previous day: 01/08 0701 - 01/09 0700 In: 806 [P.O.:800; I.V.:6] Out: 100 [Urine:100] Intake/Output this shift:    Medications Current Facility-Administered Medications  Medication Dose Route Frequency Provider Last Rate Last Dose  . 0.45 % sodium chloride infusion   Intravenous Continuous Erlene Quan, PA-C 50 mL/hr at 06/10/13 0515    . 0.9 %  sodium chloride infusion  250 mL Intravenous PRN Hosie Poisson, MD      . 0.9 %  sodium chloride infusion  250 mL Intravenous PRN Erlene Quan, PA-C      . acetaminophen (TYLENOL) tablet 650 mg  650 mg Oral Q4H PRN Hosie Poisson, MD      . apixaban (ELIQUIS) tablet 5 mg  5 mg Oral BID Luke K Kilroy, PA-C      . atorvastatin (LIPITOR) tablet 40 mg  40 mg Oral q1800 Nita Sells, MD   40 mg at 06/09/13 1607  . busPIRone (BUSPAR) tablet 5 mg  5 mg Oral BID Nita Sells, MD   5 mg at 06/09/13 2129  . diazepam (VALIUM) tablet 5 mg  5 mg Oral On Call Luke K Kilroy, PA-C      . diphenhydrAMINE (BENADRYL) capsule 25 mg  25 mg Oral Q8H PRN Nita Sells, MD      . furosemide (LASIX) tablet 40 mg  40 mg Oral BID Luke K Kilroy, PA-C      . metoprolol (LOPRESSOR) tablet 50 mg  50 mg Oral BID Lelon Perla, MD   50 mg at 06/09/13 2128  . omega-3 acid ethyl esters (LOVAZA) capsule 1 g  1 g Oral BID Hosie Poisson, MD   1 g at 06/09/13 2128  . ondansetron (ZOFRAN) injection 4 mg  4 mg Intravenous Q6H PRN Hosie Poisson, MD      . potassium chloride SA (K-DUR,KLOR-CON) CR tablet 20 mEq  20 mEq Oral BID Erlene Quan, PA-C   20 mEq at 06/09/13 2132  . sodium chloride 0.9 % injection 3 mL  3 mL Intravenous Q12H Hosie Poisson, MD   3 mL at 06/09/13 2129  . sodium chloride 0.9 % injection 3 mL  3 mL Intravenous PRN Hosie Poisson, MD      . sodium chloride 0.9 % injection 3 mL  3 mL Intravenous Q12H Luke K Kilroy, PA-C      . sodium chloride 0.9 % injection 3 mL  3 mL Intravenous PRN Erlene Quan, PA-C   3 mL at 06/10/13 0516  . zolpidem (AMBIEN) tablet 5 mg  5 mg Oral QHS PRN Dianne Dun, NP        PE: General appearance: alert, cooperative and no distress Lungs: clear to auscultation bilaterally Heart: irregularly irregular rhythm Extremities: Trace LEE Pulses: 1+ right radial.  2+ left.  1+ DPs. Skin: Warm and dry Neurologic: Grossly normal  Lab Results:   Recent Labs  06/08/13 0918 06/10/13 0500  WBC 7.2 7.5  HGB 14.8 14.5  HCT 42.5 41.2  PLT 115* 112*   BMET  Recent Labs  06/08/13 0918 06/09/13 0238 06/10/13 0500  NA 145 139 141  K 3.8 3.5* 4.3  CL 106 101 106  CO2 25 19 24   GLUCOSE 110* 98 103*  BUN 17 15 14   CREATININE 0.78 0.74 0.75  CALCIUM 8.6 8.4 8.5   PT/INR  Recent Labs  06/10/13 0500  LABPROT 13.8  INR 1.08   Cholesterol  Recent Labs  06/09/13 0238  CHOL 160   Lipid Panel     Component Value Date/Time   CHOL 160 06/09/2013 0238   TRIG 214* 06/09/2013 0238   HDL 22* 06/09/2013 0238   CHOLHDL 7.3 06/09/2013 0238   VLDL 43* 06/09/2013 0238   LDLCALC 95 06/09/2013 0238     Studies/Results: @RISRSLT2 @   Assessment/Plan    Principal Problem:   Acute CHF- etiology not yet determined Active Problems:   Atrial fibrillation with rapid ventricular response   Sleep apnea- on C-pap   HTN (hypertension)   Dyslipidemia- HDL 22, LDL 95  Plan:  EF 25-30%, mild to moderate MV regurg, severe LA dilation.  LHC today.  ELIQUIS  Held last night.   Maintaining afib with rate of 106.  BP stable.   Meds:  Lipitor, lasix 40mg  PO BID, lopressor 50 BID,  Lovaza.  Lipid panel looks terrible.  I discussed decreased carbohydrate intake.  The patient puts 4 cups of sugar in a gallon of tea!  We also talked about sodium restriction and daily weights.     LOS: 2 days    HAGER, BRYAN 06/10/2013 9:04 AM  As above; patient seen and examined; some orthopnea but no chest pain; Echo shows EF 25-30; may have tachycardia mediated CM; plan cath today to exclude CAD (risks and benefits discussed and patient agrees to proceed); if no CAD, resume apixaban in AM; increase lopressor to 75 BID for rate control; continue lasix and follow renal function; add ACEI later if BP allows (once HR controlled); would plan DCCV 3 weeks after uninterrupted anticoagulation (or TEE guided if pt remains symptomatic despite diuresis or HR difficult to control). Kirk Ruths

## 2013-06-10 NOTE — Progress Notes (Signed)
06/10/13 1900-0700. Pt.is A/Ox4 and is independent. She had no c/o pain during the shift. He watched the heart failure patient video. Around 2330 patient had c/o abdominal pressure, shortness of breath, and cold sweat. VS were taken and oxygen saturation was 99% on room air Pt.placed on 2 L [place] oxygen for comfort. Lung sounds were clear. Mid level on call for [facility] was called and notified. New order was written. After this acute episode patient went to sleep with no further c/o SOB or symptoms. Pt.is going for procedure this am. Consent signed and placed in chart. Labs done. EKG was done and placed in chart. He had been NPO since midnight except for sips of water with medications. Cardiac Cath RN came to speak with patient said procedure is scheduled for around 1:30pm and that he could receive clear fluid early in the am.

## 2013-06-10 NOTE — Progress Notes (Signed)
Pt has hemmroids that started to bleed, MD notified, pt denies dizziness, pt stable

## 2013-06-10 NOTE — Evaluation (Signed)
Physical Therapy Evaluation Patient Details Name: [name] MRN: [id number] DOB: 10/11/1950 Today's Date: 06/10/2013 Time: 4917-9150 PT Time Calculation (min): 21 min  PT Assessment / Plan / Recommendation History of Present Illness  63 y.o. male admitted with SOB for over a month.  Found to be in CHF.  Clinical Impression  Patient presents independent with mobility, but with subjective complaints of weakness (partially due to lack of sleep with breathing trouble at night and due to eating less than he's used to per his report.)  Feel he can mobilize safely in hospital and home environment independent.  Encouraged walking in hallway at lest twice daily until d/c.  No further skilled PT needs identified.    PT Assessment  Patent does not need any further PT services    Follow Up Recommendations  No PT follow up    Does the patient have the potential to tolerate intense rehabilitation    N/A  Barriers to Discharge  None      Equipment Recommendations  None recommended by PT    Recommendations for Other Services   None  Frequency   N/A   Precautions / Restrictions Precautions Precautions: None   Pertinent Vitals/Pain No pain compliants HR 132-113 after stair negotiation (?a-fib)      Mobility  Bed Mobility Overal bed mobility: Independent Transfers Overall transfer level: Independent Ambulation/Gait Ambulation/Gait assistance: Independent Ambulation Distance (Feet): 300 Feet Assistive device: None Gait Pattern/deviations: WFL(Within Functional Limits) Gait velocity interpretation: >2.62 ft/sec, indicative of independent community ambulator Stairs: Yes Stairs assistance: Modified independent (Device/Increase time) Stair Management: Alternating pattern;One rail Left;Forwards Number of Stairs: 4 General stair comments: HR 132 ?a-fib with stair negotiation       PT Goals(Current goals can be found in the care plan section) Acute Rehab PT Goals PT Goal  Formulation: No goals set, d/c therapy  Visit Information  Last PT Received On: 06/10/13 Assistance Needed: +1 History of Present Illness: 63 y.o. male admitted with SOB for over a month.  Found to be in CHF.       Prior [place] expects to be discharged to:: Private residence Living Arrangements: Spouse/significant other;Children;Other relatives Available Help at Discharge: Family;Available 24 hours/day Type of Home: House Home Access: Level entry Home Layout: One level Home Equipment: None Prior Function Level of Independence: Independent Comments: worked 10 hour days Communication Communication: No difficulties Dominant Hand: Right    Cognition  Cognition Arousal/Alertness: Awake/alert Behavior During Therapy: WFL for tasks assessed/performed Overall Cognitive Status: Within Functional Limits for tasks assessed    Extremity/Trunk Assessment Upper Extremity Assessment Upper Extremity Assessment: Overall WFL for tasks assessed Lower Extremity Assessment Lower Extremity Assessment: Overall WFL for tasks assessed   Balance Balance Overall balance assessment: Independent General Comments General comments (skin integrity, edema, etc.): able to pick up tissue from floor, turns in a circle in <4 seconds bilaterally, stands in single limb stance 30 seconds  End of Session PT - End of Session Equipment Utilized During Treatment: Gait belt Activity Tolerance: Patient tolerated treatment well Patient left: in bed (sitting on edge of bed)  GP     [facility] 06/10/2013, 9:44 AM Magda Kiel, [place] 06/10/2013

## 2013-06-10 NOTE — Progress Notes (Signed)
Patient refused CPAP at this time.  See previous note.

## 2013-06-10 NOTE — Progress Notes (Signed)
Pt a/o, vss, pt had hearth cath today through the left radial, TR band deflated per protocol, no complications, site is level 0, 2x2 dressing applied, pt has good radial pule and CMS, pt oob ad lib, tolerated diet well.  Pt received exit care notes on A-fib, heart failure, and 2 gram sodium diet, pt verbalized understanding, pt stable

## 2013-06-10 NOTE — CV Procedure (Addendum)
       PROCEDURE:  Left heart catheterization with selective coronary angiography, left ventriculogram.  INDICATIONS:  CHF  The risks, benefits, and details of the procedure were explained to the patient.  The patient verbalized understanding and wanted to proceed.  Informed written consent was obtained.  PROCEDURE TECHNIQUE:  After Xylocaine anesthesia a 45F slender sheath was placed in the leftt radial artery with a single anterior needle wall stick. We were unable to access the right radial artery despite several attempts. IV heparin was given. Right coronary angiography was done using a Judkins R4 guide catheter.  Left coronary angiography was done using a Judkins L3.5 guide catheter.  Left ventriculography was done using a pigtail catheter.  A TR band was used for hemostasis.   CONTRAST:  Total of 70 cc.  COMPLICATIONS:  None.    HEMODYNAMICS:  Aortic pressure was 123/94; LV pressure was 115/19; LVEDP 27.  There was no gradient between the left ventricle and aorta.    ANGIOGRAPHIC DATA:   The left main coronary artery is patent.  The left anterior descending artery is a large vessel which reaches the apex. There is moderate, diffuse atherosclerosis in the proximal to mid vessel without apparent hemodynamic the significant stenosis. There are 3 diagonal vessels. The first has a very high takeoff and appears widely patent. The second and third diagonals are medium size and widely patent.  The left circumflex artery is a large codominant vessel. In the proximal vessel, there is a 25% stenosis. There is a large ramus vessel as well which appears widely patent. The first OM is small but patent. The left PDA appears patent.  The right coronary artery is a large codominant vessel. There is mild atherosclerosis in the proximal to mid vessel.  The PDA is large and patent. The posterior lateral artery is small.  LEFT VENTRICULOGRAM:  Left ventricular angiogram was done in the 30 RAO projection  and revealed severely decreased left ventricular systolic function with an estimated ejection fraction of 25% .  LVEDP was  27 mmHg.  IMPRESSIONS:  1. Widely patent left main coronary artery. 2. Moderate, diffuse atherosclerosis in the  left anterior descending artery  without significant stenosis.   3.  Widely patent   Codominant left circumflex artery and its branches. 4.  Widely patent codominant  right coronary artery. 5.  Severely decreasedleft ventricular systolic function, which appears to be global.  LVEDP  27 mmHg.  Ejection fraction  25%.  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 Continue medical therapy to improve LV function. Continue rate control. I switched him back to intravenous Lasix since his LVEDP is still quite elevated. Restart Eliquis in the morning.  Followup with Dr. Stanford Breed.

## 2013-06-10 NOTE — Progress Notes (Signed)
MONTREAL STEIDLE JOI:[id number] DOB: 13-Mar-1951 DOA: 06/08/2013 PCP: No PCP Per Patient  Brief narrative: 63 y/o ?, benign past medical history, admitted with new onset CHF, paroxysmal atrial fibrillation, [place] score 1. Recently seen at [facility] urgent care by FNP, treated for bronchitis returned and was noted to have high blood pressure as well as increasing fluid given one week course of on metoprolol, Lasix which he ran out of.  Past medical history-As per Problem list Chart reviewed as below- see above  Consultants:   Cardiology  Procedures:   Echo pending  Antibiotics:   None   Subjective   feels well. 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 Over the same month time he has had lower strength is swelling some extent He does not have any chest pain he cannot tell me if he's had any palpitations recently. He does admit to dietary noncompliance but otherwise has no real medical illnesses   Objective    Interim History:  None  Telemetry:  Atrial fibrillation rates 120 to 1:30 while seated   Objective: Filed Vitals:   06/10/13 1520 06/10/13 1530 06/10/13 1545 06/10/13 1600  BP: 141/107 112/78 120/80 108/81  Pulse: 115 87 90 88  Temp:      TempSrc:      Resp:      Height:      Weight:      SpO2:        Intake/Output Summary (Last 24 hours) at 06/10/13 1700 Last data filed at 06/10/13 1629  Gross per 24 hour  Intake    126 ml  Output    500 ml  Net   -374 ml    Exam:  General:  EOMI, NCAT, no murmur rub or gallop, no JVD, no bruit Cardiovascular:  S1-S2 irregularly irregular Respiratory:  Clear no egophony no fremitus Abdomen:  Soft nontender Skin no lower extremity edema Neuro intact  Data Reviewed: Basic Metabolic Panel:  Recent Labs Lab 06/08/13 0918 06/09/13 0238 06/10/13 0500  NA 145  139 141  K 3.8 3.5* 4.3  CL 106 101 106  CO2 25 19 24   GLUCOSE 110* 98 103*  BUN 17 15 14   CREATININE 0.78 0.74 0.75  CALCIUM 8.6 8.4 8.5  MG  --  2.2  --   PHOS  --   --  3.2   Liver Function Tests:  Recent Labs Lab 06/09/13 0238 06/10/13 0500  AST 34  34  --   ALT 29  29  --   ALKPHOS 67  68  --   BILITOT 0.7  0.7  --   PROT 5.9*  5.8*  --   ALBUMIN 3.1*  3.1* 3.2*   No results found for this basename: LIPASE, AMYLASE,  in the last 168 hours No results found for this basename: AMMONIA,  in the last 168 hours CBC:  Recent Labs Lab 06/08/13 0918 06/10/13 0500  WBC 7.2 7.5  NEUTROABS 5.3 5.1  HGB 14.8 14.5  HCT 42.5 41.2  MCV 85.9 87.5  PLT 115* 112*   Cardiac Enzymes:  Recent Labs Lab 06/08/13 0918 06/08/13 1601 06/08/13 2005 06/09/13 0238  TROPONINI <0.30 <0.30 <0.30 <0.30   BNP: No components found with this basename: POCBNP,  CBG:  Recent Labs Lab 06/08/13 2120 06/09/13 0629  GLUCAP  109* 100*    No results found for this or any previous visit (from the past 240 hour(s)).   Studies:              All Imaging reviewed and is as per above notation   Scheduled Meds: . [START ON 06/11/2013] apixaban  5 mg Oral BID  . atorvastatin  40 mg Oral q1800  . busPIRone  5 mg Oral BID  . furosemide  40 mg Intravenous BID  . metoprolol tartrate  75 mg Oral BID  . omega-3 acid ethyl esters  1 g Oral BID  . potassium chloride  20 mEq Oral BID  . sodium chloride  3 mL Intravenous Q12H  . sodium chloride  3 mL Intravenous Q12H   Continuous Infusions:    Assessment/Plan: 1. Nonischemic cardiomyopathy with systolic dysfunction-potentially driven by atrial fibrillation. Cardiology to comment on further future plans  2. Acute decompensated likely diastolic heart failure-proBNP 1900 echocardiogram pending. Further care as per cardiology.  continue IV Lasix 40 mg daily 3. Anxiety-situational-Added Buspar bid on 1/8 4. P Afib, Chad2vasc 1- started on  Elliquis? DC vs Chme Cardioversion?Marland Kitchen Uptitrated metoprolol to 50--75 mg twice a day-blood pressure can probably support this-for now continue aspirin 81 mg daily only 5. Hypertension-continue ACE inhibitor low dose 2.5 mg daily in addition to above medications 6. Hyperlipidemia-needs dietary education, triglycerides 214, LDL 95, needs at least moderate intensity statin, Lipitor 40 7. Mild thrombocytopenia-get peripheral smear.  OP follow up.  Discuss c Hem onc if drops below 100  Code Status:  Full Family Communication:  None bedside Disposition Plan:  Inpatient pending cardiology reevaluation   Verneita Griffes, MD  [facility] Pager [phone] 06/10/2013, 5:00 PM    LOS: 2 days

## 2013-06-10 NOTE — Progress Notes (Signed)
Brief Nutrtion Note:   Consult received to provide diet education for pt with new CHF dx.  Pt currently in the cath lab for procedure.  Will follow up on Monday to provide education.  [place], [place], [place] Pager [phone] After Hours Pager

## 2013-06-10 NOTE — Interval H&P Note (Signed)
Cath Lab Visit (complete for each Cath Lab visit)  Clinical Evaluation Leading to the Procedure:   ACS: yes  Non-ACS:    Anginal Classification: CCS IV  Anti-ischemic medical therapy: Minimal Therapy (1 class of medications)  Non-Invasive Test Results: No non-invasive testing performed  Prior CABG: No previous CABG      History and Physical Interval Note:  06/10/2013 1:43 PM  [name]  has presented today for surgery, with the diagnosis of Chest pain  The various methods of treatment have been discussed with the patient and family. After consideration of risks, benefits and other options for treatment, the patient has consented to  Procedure(s): LEFT HEART CATHETERIZATION WITH CORONARY ANGIOGRAM (N/A) as a surgical intervention .  The patient's history has been reviewed, patient examined, no change in status, stable for surgery.  I have reviewed the patient's chart and labs.  Questions were answered to the patient's satisfaction.     Brittyn Salaz S.

## 2013-06-11 LAB — CBC
HCT: 41.2 % (ref 39.0–52.0)
Hemoglobin: 14 g/dL (ref 13.0–17.0)
MCH: 30 pg (ref 26.0–34.0)
MCHC: 34 g/dL (ref 30.0–36.0)
MCV: 88.2 fL (ref 78.0–100.0)
PLATELETS: 111 10*3/uL — AB (ref 150–400)
RBC: 4.67 MIL/uL (ref 4.22–5.81)
RDW: 14 % (ref 11.5–15.5)
WBC: 7 10*3/uL (ref 4.0–10.5)

## 2013-06-11 LAB — BASIC METABOLIC PANEL
BUN: 15 mg/dL (ref 6–23)
CALCIUM: 8.5 mg/dL (ref 8.4–10.5)
CO2: 23 mEq/L (ref 19–32)
CREATININE: 0.8 mg/dL (ref 0.50–1.35)
Chloride: 104 mEq/L (ref 96–112)
GFR calc Af Amer: >90 mL/min (ref >90)
GLUCOSE: 109 mg/dL — AB (ref 70–99)
Potassium: 4.1 mEq/L (ref 3.7–5.3)
SODIUM: 142 meq/L (ref 137–147)

## 2013-06-11 LAB — SAVE SMEAR

## 2013-06-11 MED ORDER — LISINOPRIL 5 MG PO TABS
5.0000 mg | ORAL_TABLET | Freq: Every day | ORAL | Status: DC
  Administered 2013-06-11 – 2013-06-12 (×2): 5 mg via ORAL
  Filled 2013-06-11 (×2): qty 1

## 2013-06-11 NOTE — Progress Notes (Signed)
[name] XIP:[id number] DOB: 06-11-1950 DOA: 06/08/2013 PCP: No PCP Per Patient  Brief narrative: 63 y/o ?, benign past medical history, admitted with new onset CHF, paroxysmal atrial fibrillation, [place] score 1. Recently seen at [facility] urgent care by FNP, treated for bronchitis returned and was noted to have high blood pressure as well as increasing fluid given one week course of on metoprolol, Lasix which he ran out of.  Past medical history-As per Problem list Chart reviewed as below- see above  Consultants:   Cardiology  Procedures:   Echo pending  Antibiotics:   None   Subjective   Well Many questions about why he has this disease NO sob or CP Ambulatory Tol diet.  No other c/o   Objective    Interim History:  None  Telemetry:  Atrial fibrillation   Objective: Filed Vitals:   06/10/13 2100 06/11/13 0545 06/11/13 0924 06/11/13 1355  BP: 129/79 114/85 131/88 114/74  Pulse: 76 40 86 89  Temp: 97.8 F (36.6 C) 97.7 F (36.5 C)  97.4 F (36.3 C)  TempSrc: Oral Oral  Oral  Resp:  20  18  Height:      Weight:  99.8 kg (220 lb 0.3 oz)    SpO2: 96% 99%  93%    Intake/Output Summary (Last 24 hours) at 06/11/13 1458 Last data filed at 06/11/13 1300  Gross per 24 hour  Intake    820 ml  Output   1976 ml  Net  -1156 ml    Exam:  General:  EOMI, NCAT, no murmur rub or gallop, no JVD, no bruit Cardiovascular:  S1-S2 irregularly irregular Respiratory:  Clear no egophony no fremitus Abdomen:  Soft nontender Skin no lower extremity edema Neuro intact  Data Reviewed: Basic Metabolic Panel:  Recent Labs Lab 06/08/13 0918 06/09/13 0238 06/10/13 0500 06/11/13 0440  NA 145 139 141 142  K 3.8 3.5* 4.3 4.1  CL 106 101 106 104  CO2 25 19 24 23   GLUCOSE 110* 98 103* 109*  BUN 17 15 14 15   CREATININE 0.78 0.74 0.75 0.80  CALCIUM 8.6 8.4 8.5 8.5  MG  --  2.2  --   --   PHOS  --   --  3.2  --    Liver Function Tests:  Recent  Labs Lab 06/09/13 0238 06/10/13 0500  AST 34  34  --   ALT 29  29  --   ALKPHOS 67  68  --   BILITOT 0.7  0.7  --   PROT 5.9*  5.8*  --   ALBUMIN 3.1*  3.1* 3.2*   No results found for this basename: LIPASE, AMYLASE,  in the last 168 hours No results found for this basename: AMMONIA,  in the last 168 hours CBC:  Recent Labs Lab 06/08/13 0918 06/10/13 0500 06/11/13 0440  WBC 7.2 7.5 7.0  NEUTROABS 5.3 5.1  --   HGB 14.8 14.5 14.0  HCT 42.5 41.2 41.2  MCV 85.9 87.5 88.2  PLT 115* 112* 111*   Cardiac Enzymes:  Recent Labs Lab 06/08/13 0918 06/08/13 1601 06/08/13 2005 06/09/13 0238  TROPONINI <0.30 <0.30 <0.30 <0.30   BNP: No components found with this basename: POCBNP,  CBG:  Recent Labs Lab 06/08/13 2120 06/09/13 0629  GLUCAP 109* 100*    No results found for this or any previous visit (from the past 240 hour(s)).   Studies:  All Imaging reviewed and is as per above notation   Scheduled Meds: . apixaban  5 mg Oral BID  . atorvastatin  40 mg Oral q1800  . busPIRone  5 mg Oral BID  . furosemide  40 mg Intravenous BID  . lisinopril  5 mg Oral Daily  . metoprolol tartrate  75 mg Oral BID  . omega-3 acid ethyl esters  1 g Oral BID  . potassium chloride  20 mEq Oral BID  . sodium chloride  3 mL Intravenous Q12H   Continuous Infusions:    Assessment/Plan: 1. Nonischemic cardiomyopathy with systolic dysfunction-potentially driven by atrial fibrillation. App cardiology input-?OP Cardioversion.  Apixiban 45 bid till OV 2. Acute decompensated systolic heart failure-etiology unclear- 3. Anxiety-situational-Added Buspar bid on 1/8 4. P Afib, Chad2vasc 1-  DC vs Chem cardioversion? Uptitrated metoprolol to 50--75 mg twice a day-blood pressure can probably support this. 5. Hypertension-continue ACE inhibitor low dose 2.5 mg daily in addition to above medications 6. Hyperlipidemia-needs dietary education, triglycerides 214, LDL 95, needs at  least moderate intensity statin, Lipitor 40 7. Mild thrombocytopenia-get peripheral smear.  OP follow up.  Discuss c Hem onc if drops below 100  Code Status:  Full Family Communication:  None bedside Disposition Plan:  Likely d/c home am   Verneita Griffes, MD  [facility] Pager [phone] 06/11/2013, 2:58 PM    LOS: 3 days

## 2013-06-11 NOTE — Progress Notes (Signed)
Patient Name: [name] Date of Encounter: 06/11/2013     Principal Problem:   Acute CHF- etiology not yet determined Active Problems:   Atrial fibrillation with rapid ventricular response   Sleep apnea- on C-pap   HTN (hypertension)   Dyslipidemia- HDL 22, LDL 95    SUBJECTIVE The patient is still having some shortness of breath.  Rhythm remains atrial fibrillation with improved ventricular response.  Doing well after cardiac catheterization.  Left radial looks good.  CURRENT MEDS . apixaban  5 mg Oral BID  . atorvastatin  40 mg Oral q1800  . busPIRone  5 mg Oral BID  . furosemide  40 mg Intravenous BID  . metoprolol tartrate  75 mg Oral BID  . omega-3 acid ethyl esters  1 g Oral BID  . potassium chloride  20 mEq Oral BID  . sodium chloride  3 mL Intravenous Q12H    OBJECTIVE  Filed Vitals:   06/10/13 1700 06/10/13 2100 06/11/13 0545 06/11/13 0924  BP: 131/97 129/79 114/85 131/88  Pulse: 104 76 40 86  Temp:  97.8 F (36.6 C) 97.7 F (36.5 C)   TempSrc:  Oral Oral   Resp:   20   Height:      Weight:   220 lb 0.3 oz (99.8 kg)   SpO2:  96% 99%     Intake/Output Summary (Last 24 hours) at 06/11/13 1223 Last data filed at 06/11/13 0933  Gross per 24 hour  Intake    460 ml  Output   1976 ml  Net  -1516 ml   Filed Weights   06/09/13 0515 06/10/13 0535 06/11/13 0545  Weight: 221 lb 12.8 oz (100.608 kg) 222 lb 3.6 oz (100.8 kg) 220 lb 0.3 oz (99.8 kg)    PHYSICAL EXAM  General: Pleasant, NAD. Neuro: Alert and oriented X 3. Moves all extremities spontaneously. Psych: Normal affect. HEENT:  Normal  Neck: Supple without bruits or JVD. Lungs:  Resp regular and unlabored, CTA. Heart: Irregularly irregular rhythm Abdomen: Soft, non-tender, non-distended, BS + x 4.  Extremities: There is mild pretibial edema. DP/PT/Radials 2+ and equal bilaterally.  Accessory Clinical Findings  CBC  Recent Labs  06/10/13 0500 06/11/13 0440  WBC 7.5 7.0    NEUTROABS 5.1  --   HGB 14.5 14.0  HCT 41.2 41.2  MCV 87.5 88.2  PLT 112* 485*   Basic Metabolic Panel  Recent Labs  06/09/13 0238 06/10/13 0500 06/11/13 0440  NA 139 141 142  K 3.5* 4.3 4.1  CL 101 106 104  CO2 19 24 23   GLUCOSE 98 103* 109*  BUN 15 14 15   CREATININE 0.74 0.75 0.80  CALCIUM 8.4 8.5 8.5  MG 2.2  --   --   PHOS  --  3.2  --    Liver Function Tests  Recent Labs  06/09/13 0238 06/10/13 0500  AST 34  34  --   ALT 29  29  --   ALKPHOS 67  68  --   BILITOT 0.7  0.7  --   PROT 5.9*  5.8*  --   ALBUMIN 3.1*  3.1* 3.2*   No results found for this basename: LIPASE, AMYLASE,  in the last 72 hours Cardiac Enzymes  Recent Labs  06/08/13 1601 06/08/13 2005 06/09/13 0238  TROPONINI <0.30 <0.30 <0.30   BNP No components found with this basename: POCBNP,  D-Dimer No results found for this basename: DDIMER,  in the last 72 hours Hemoglobin  A1C No results found for this basename: HGBA1C,  in the last 72 hours Fasting Lipid Panel  Recent Labs  06/09/13 0238  CHOL 160  HDL 22*  LDLCALC 95  TRIG 214*  CHOLHDL 7.3   Thyroid Function Tests  Recent Labs  06/08/13 1601  TSH 3.942    TELE  Atrial fibrillation with controlled ventricular response.  Occasional PVCs.  ECG    Radiology/Studies  Dg Chest 2 View  06/08/2013   CLINICAL DATA:  Short of breath.  Chest pain.  EXAM: CHEST  2 VIEW  COMPARISON:  None.  FINDINGS: Cardiomegaly and pulmonary vascular congestion are present with cephalization of pulmonary blood flow. No focal consolidation is identified. Thickening of the fissures is noted on the lateral view and there are Kerley B lines at the periphery of the lung, compatible with interstitial pulmonary edema.  IMPRESSION: Mild CHF with interstitial pulmonary edema.   Electronically Signed   By: Dereck Ligas M.D.   On: 06/08/2013 10:10    ASSESSMENT AND PLAN 1. atrial fibrillation 2. nonischemic cardiomyopathy by left heart  catheterization.  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 3. sleep apnea on CPAP 4. Dyslipidemia  Plan: Continue IV Lasix today.  Switch to oral Lasix tomorrow.  Blood pressure is now adequate to add ACE inhibitor.  Continue metoprolol 75 mg twice a day Possibly home tomorrow with plans for direct-current cardioversion 3 weeks after uninterrupted anticoagulation.  He does have mild to moderate mitral valve regurgitation and severe left atrial dilatation which may limit long-term success with holding sinus rhythm.   Signed, Darlin Coco MD

## 2013-06-11 NOTE — Plan of Care (Signed)
Problem: Phase I Progression Outcomes Goal: EF % per last Echo/documented,Core Reminder form on chart Outcome: Completed/Met Date Met:  06/11/13 EF 25 - 30 %

## 2013-06-11 NOTE — Progress Notes (Signed)
Pt a/o, no c/o pain, left radial site is level 0, pt ambulated in hallway ad lib multiple times throughout the day, tolerated well, vss, pt stable

## 2013-06-11 NOTE — Progress Notes (Signed)
Patient refused CPAP at this time.

## 2013-06-12 LAB — BASIC METABOLIC PANEL
BUN: 15 mg/dL (ref 6–23)
CALCIUM: 9.1 mg/dL (ref 8.4–10.5)
CO2: 27 meq/L (ref 19–32)
Chloride: 104 mEq/L (ref 96–112)
Creatinine, Ser: 0.86 mg/dL (ref 0.50–1.35)
GFR calc Af Amer: >90 mL/min (ref >90)
GFR calc non Af Amer: >90 mL/min (ref >90)
GLUCOSE: 99 mg/dL (ref 70–99)
Potassium: 4.3 mEq/L (ref 3.7–5.3)
SODIUM: 143 meq/L (ref 137–147)

## 2013-06-12 MED ORDER — FUROSEMIDE 40 MG PO TABS
40.0000 mg | ORAL_TABLET | Freq: Two times a day (BID) | ORAL | Status: DC
  Filled 2013-06-12 (×2): qty 1

## 2013-06-12 MED ORDER — APIXABAN 5 MG PO TABS: 5.0000 mg | ORAL_TABLET | Freq: Two times a day (BID) | ORAL | Status: DC

## 2013-06-12 MED ORDER — BUSPIRONE HCL 5 MG PO TABS: 5.0000 mg | ORAL_TABLET | Freq: Two times a day (BID) | ORAL | Status: DC

## 2013-06-12 MED ORDER — LISINOPRIL 5 MG PO TABS: 5.0000 mg | ORAL_TABLET | Freq: Every day | ORAL | Status: DC

## 2013-06-12 MED ORDER — FUROSEMIDE 40 MG PO TABS: 40.0000 mg | ORAL_TABLET | Freq: Two times a day (BID) | ORAL | Status: DC

## 2013-06-12 MED ORDER — ATORVASTATIN CALCIUM 40 MG PO TABS: 40.0000 mg | ORAL_TABLET | Freq: Every day | ORAL | Status: DC

## 2013-06-12 NOTE — Progress Notes (Signed)
Patient refused CPAP at this time.

## 2013-06-12 NOTE — Progress Notes (Signed)
Patient requested sleep aid, PRN Ambien administered as ordered. Patient is currently resting comfortably, will continue to monitor. Tresa Endo

## 2013-06-12 NOTE — Progress Notes (Addendum)
   CARE MANAGEMENT NOTE 06/12/2013  Patient:  [name], [name]   Account Number:  [id number]  Date Initiated:  06/09/2013  Documentation initiated by:  HUTCHINSON,CRYSTAL  Subjective/Objective Assessment:   Admitted with A-Fib     Action/Plan:   CM will monitor for disposition needs   Anticipated DC Date:  06/12/2013   Anticipated DC Plan:  [place]  CM consult  Follow-up appt scheduled  MATCH Program      Choice offered to / List presented to:             Status of service:  Completed, signed off Medicare Important Message given?   (If response is "NO", the following Medicare IM given date fields will be blank) Date Medicare IM given:   Date Additional Medicare IM given:    Discharge Disposition:  HOME/SELF CARE  Per UR Regulation:  Reviewed for med. necessity/level of care/duration of stay  If discussed at [place] of Stay Meetings, dates discussed:    Comments:  06/12/13 15:40 Cm spoke with pt in room and pt states he has no insurance and no money for medications.  CM gave pt [place] letter with a list of participating pharmacies and explained the parameters of the [facility] program.  Pt verbalized understanding.  CM gave pt a [employer] and pt then pulled a [place] appt sheet and is scheduled for an appt to get an orange card on 07/13/13. Pt understands the importance of making sure he gets to this appt.  Pt instructed to call the [place] tomorrow 06/13/13 morning to tell the [facility] he has just been discharged and ask if it is possible to get an earlier appt.  Pt given a [employer] [place] handout with the intake number to secure an appt with a navigator to pursue insurance/orange card.  Pt verbalized understanding the importance of following through with these appts as he has a chronic condition which will require lifetime management.  No other CM needs were communicated.  Mariane Masters, BSN, [place]  [phone].  06/09/2013 Last UR Date 06/09/2013 [place] Check:  Eliquis 10mg  BID ALL MEDICAID PT.HAS A $3.00 CO-PAY FOR MEDS. Medicaid Potential:  approval status unknown at this time. Dispositon Plan: F/U F.A. re:  MCD status. Provide patient with Eliquis 30 Day Supply Card at d/c if MD orders. Crystal Hutchinson RN, BSN, M[facility], CCM

## 2013-06-12 NOTE — Progress Notes (Signed)
Patient alert and oriented x4.  Vital signs stable.  Patient ambulated in hallway independently this morning.  Dressing over radial site changed to bandaid; remains a level 0.  Patient states the plan is to be discharged this afternoon; states he has no questions or concerns at this time.  Will continue to monitor.

## 2013-06-12 NOTE — Progress Notes (Addendum)
Patient Name: [name] Date of Encounter: 06/12/2013     Principal Problem:   Acute CHF- etiology not yet determined Active Problems:   Atrial fibrillation with rapid ventricular response   Sleep apnea- on C-pap   HTN (hypertension)   Dyslipidemia- HDL 22, LDL 95    SUBJECTIVE Patient states that he is doing well.  No chest pain or shortness of breath.  He has walked numerous times in the hall.  CURRENT MEDS . apixaban  5 mg Oral BID  . atorvastatin  40 mg Oral q1800  . busPIRone  5 mg Oral BID  . furosemide  40 mg Intravenous BID  . lisinopril  5 mg Oral Daily  . metoprolol tartrate  75 mg Oral BID  . omega-3 acid ethyl esters  1 g Oral BID  . potassium chloride  20 mEq Oral BID  . sodium chloride  3 mL Intravenous Q12H    OBJECTIVE  Filed Vitals:   06/11/13 1355 06/11/13 2300 06/12/13 0609 06/12/13 0917  BP: 114/74 124/83 133/70 144/83  Pulse: 89 92 57 74  Temp: 97.4 F (36.3 C) 98 F (36.7 C) 97.7 F (36.5 C)   TempSrc: Oral Oral Oral   Resp: 18 20 18    Height:      Weight:   219 lb 4.8 oz (99.474 kg)   SpO2: 93% 94% 98%     Intake/Output Summary (Last 24 hours) at 06/12/13 1216 Last data filed at 06/12/13 1103  Gross per 24 hour  Intake   1203 ml  Output   3100 ml  Net  -1897 ml   Filed Weights   06/10/13 0535 06/11/13 0545 06/12/13 0609  Weight: 222 lb 3.6 oz (100.8 kg) 220 lb 0.3 oz (99.8 kg) 219 lb 4.8 oz (99.474 kg)    PHYSICAL EXAM  General: Pleasant, NAD. Neuro: Alert and oriented X 3. Moves all extremities spontaneously. Psych: Normal affect. HEENT:  Normal  Neck: Supple without bruits or JVD. Lungs:  Resp regular and unlabored, CTA. Heart: Irregularly irregular rhythm Abdomen: Soft, non-tender, non-distended, BS + x 4.  Extremities: No phlebitis.  No significant edema  Accessory Clinical Findings  CBC  Recent Labs  06/10/13 0500 06/11/13 0440  WBC 7.5 7.0  NEUTROABS 5.1  --   HGB 14.5 14.0  HCT 41.2 41.2  MCV  87.5 88.2  PLT 112* 170*   Basic Metabolic Panel  Recent Labs  06/10/13 0500 06/11/13 0440 06/12/13 0503  NA 141 142 143  K 4.3 4.1 4.3  CL 106 104 104  CO2 24 23 27   GLUCOSE 103* 109* 99  BUN 14 15 15   CREATININE 0.75 0.80 0.86  CALCIUM 8.5 8.5 9.1  PHOS 3.2  --   --    Liver Function Tests  Recent Labs  06/10/13 0500  ALBUMIN 3.2*   No results found for this basename: LIPASE, AMYLASE,  in the last 72 hours Cardiac Enzymes No results found for this basename: CKTOTAL, CKMB, CKMBINDEX, TROPONINI,  in the last 72 hours BNP No components found with this basename: POCBNP,  D-Dimer No results found for this basename: DDIMER,  in the last 72 hours Hemoglobin A1C No results found for this basename: HGBA1C,  in the last 72 hours Fasting Lipid Panel No results found for this basename: CHOL, HDL, LDLCALC, TRIG, CHOLHDL, LDLDIRECT,  in the last 72 hours Thyroid Function Tests No results found for this basename: TSH, T4TOTAL, FREET3, T3FREE, THYROIDAB,  in the last 72 hours  TELE  Atrial fibrillation with controlled ventricular response.  Occasional PVCs.  ECG    Radiology/Studies  Dg Chest 2 View  06/08/2013   CLINICAL DATA:  Short of breath.  Chest pain.  EXAM: CHEST  2 VIEW  COMPARISON:  None.  FINDINGS: Cardiomegaly and pulmonary vascular congestion are present with cephalization of pulmonary blood flow. No focal consolidation is identified. Thickening of the fissures is noted on the lateral view and there are Kerley B lines at the periphery of the lung, compatible with interstitial pulmonary edema.  IMPRESSION: Mild CHF with interstitial pulmonary edema.   Electronically Signed   By: Dereck Ligas M.D.   On: 06/08/2013 10:10    ASSESSMENT AND PLAN 1. atrial fibrillation 2. nonischemic cardiomyopathy by left heart catheterization.  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 3. sleep apnea on CPAP 4. Dyslipidemia  Plan: The  patient is doing well.  Will switch to oral Lasix today.  We'll increase her lisinopril dose today.  From a cardiac standpoint he is stable for discharge.  He should have a transition of care appointment with Dr. Acie Fredrickson or nurse practitioner within the next 5-7 days.  He should have a BMET when he returns for office visit.  The plan will be for elective outpatient cardioversion after 3 weeks of anticoagulation.  He does have mild to moderate mitral valve regurgitation and severe left atrial dilatation which may limit long-term success with holding sinus rhythm.   Signed, Darlin Coco MD

## 2013-06-12 NOTE — Progress Notes (Signed)
Patient discharged to home.  IV removed prior to discharge; IV site clean, dry, and intact.  Discharge instructions and prescriptions discussed with patient.  Patient voiced understanding of discharge instructions and education.

## 2013-06-13 ENCOUNTER — Encounter: Payer: Self-pay | Admitting: Family Medicine

## 2013-06-13 LAB — PATHOLOGIST SMEAR REVIEW

## 2013-06-14 NOTE — ED Provider Notes (Signed)
CSN: [id number]     Arrival date & time 06/08/13  7062 History   First MD Initiated Contact with Patient 06/08/13 0740     Chief Complaint  Patient presents with  . Shortness of Breath   (Consider location/radiation/quality/duration/timing/severity/associated sxs/prior Treatment) HPI Comments: 63 y.o. male with recently diagnosed CHF, and atrial fibrillation, on b blockers and lasix, aspirin, comes in today for worsening sob, with activity, associated with orthopnea and PND. He reports mild pedal edema, which has resolved after taking lasix. He was seen with the the CHF like sx recently at an urgent care and given lasix and asked to see a PCP. He was also dx with bronchitis recently. He denies any chest pain, palpitations or syncope. Denies family h/o of heart disease. He denies smoking or alcohol in take. On arrival to ED, he was found to be in atrial fib with rate between 100 to 120.   Patient is a 63 y.o. male presenting with shortness of breath. The history is provided by the patient.  Shortness of Breath Associated symptoms: no chest pain, no cough, no fever, no headaches, no neck pain and no rash     Past Medical History  Diagnosis Date  . Acute CHF 06/08/2013  . Bleeding stomach ulcer 1990's  . OSA on CPAP     "started w/CPAP ~ 03/2011" (06/09/2013)  . Hypertension     "always have been borderling; stopped taking RX awhile back" (06/09/2013)  . Arthritis     "hips" (06/09/2013)  . Gout    Past Surgical History  Procedure Laterality Date  . Cholecystectomy  1990's   Family History  Problem Relation Age of Onset  . Cancer    . Diabetes     History  Substance Use Topics  . Smoking status: Former Smoker -- 2.00 packs/day for 20 years    Types: Cigarettes  . Smokeless tobacco: Never Used     Comment: 06/09/2013 "stopped smoking cigarettes in the early 1990's"  . Alcohol Use: No    Review of Systems  Constitutional: Negative for fever, chills and activity change.  Eyes:  Negative for visual disturbance.  Respiratory: Positive for shortness of breath. Negative for cough and chest tightness.   Cardiovascular: Negative for chest pain.  Gastrointestinal: Negative for abdominal distention.  Genitourinary: Negative for dysuria, enuresis and difficulty urinating.  Musculoskeletal: Negative for arthralgias and neck pain.  Skin: Negative for rash.  Neurological: Negative for dizziness, light-headedness and headaches.  Hematological: Does not bruise/bleed easily.  Psychiatric/Behavioral: Negative for confusion.    Allergies  Review of patient's allergies indicates no known allergies.  Home Medications   Current Outpatient Rx  Name  Route  Sig  Dispense  Refill  . apixaban (ELIQUIS) 5 MG TABS tablet   Oral   Take 1 tablet (5 mg total) by mouth 2 (two) times daily.   60 tablet   0   . atorvastatin (LIPITOR) 40 MG tablet   Oral   Take 1 tablet (40 mg total) by mouth daily at 6 PM.   30 tablet   0   . busPIRone (BUSPAR) 5 MG tablet   Oral   Take 1 tablet (5 mg total) by mouth 2 (two) times daily.   60 tablet   0   . furosemide (LASIX) 40 MG tablet   Oral   Take 1 tablet (40 mg total) by mouth 2 (two) times daily.   60 tablet   0   . lisinopril (PRINIVIL,ZESTRIL) 5 MG tablet  Oral   Take 1 tablet (5 mg total) by mouth daily.   30 tablet   0    BP 130/96  Pulse 64  Temp(Src) 97.9 F (36.6 C) (Oral)  Resp 20  Ht 5\' 11"  (1.803 m)  Wt 219 lb 4.8 oz (99.474 kg)  BMI 30.60 kg/m2  SpO2 96% Physical Exam  Nursing note and vitals reviewed. Constitutional: He is oriented to person, place, and time. He appears well-developed.  HENT:  Head: Normocephalic and atraumatic.  Eyes: Conjunctivae and EOM are normal. Pupils are equal, round, and reactive to light.  Neck: Normal range of motion. Neck supple.  Cardiovascular:  Tachycardia, irregularly irregular.  Pulmonary/Chest: Effort normal and breath sounds normal.  Abdominal: Soft. Bowel sounds  are normal. He exhibits no distension. There is no tenderness. There is no rebound and no guarding.  Musculoskeletal: He exhibits edema.  Neurological: He is alert and oriented to person, place, and time.  Skin: Skin is warm.    ED Course  Procedures (including critical care time) Labs Review Labs Reviewed  CBC WITH DIFFERENTIAL - Abnormal; Notable for the following:    Platelets 115 (*)    All other components within normal limits  BASIC METABOLIC PANEL - Abnormal; Notable for the following:    Glucose, Bld 110 (*)    All other components within normal limits  URINALYSIS, ROUTINE W REFLEX MICROSCOPIC - Abnormal; Notable for the following:    Protein, ur 30 (*)    All other components within normal limits  PRO B NATRIURETIC PEPTIDE - Abnormal; Notable for the following:    Pro B Natriuretic peptide (BNP) 2698.0 (*)    All other components within normal limits  PRO B NATRIURETIC PEPTIDE - Abnormal; Notable for the following:    Pro B Natriuretic peptide (BNP) 1919.0 (*)    All other components within normal limits  COMPREHENSIVE METABOLIC PANEL - Abnormal; Notable for the following:    Potassium 3.5 (*)    Total Protein 5.9 (*)    Albumin 3.1 (*)    All other components within normal limits  LIPID PANEL - Abnormal; Notable for the following:    Triglycerides 214 (*)    HDL 22 (*)    VLDL 43 (*)    All other components within normal limits  GLUCOSE, CAPILLARY - Abnormal; Notable for the following:    Glucose-Capillary 109 (*)    All other components within normal limits  GLUCOSE, CAPILLARY - Abnormal; Notable for the following:    Glucose-Capillary 100 (*)    All other components within normal limits  HEPATIC FUNCTION PANEL - Abnormal; Notable for the following:    Total Protein 5.8 (*)    Albumin 3.1 (*)    All other components within normal limits  RENAL FUNCTION PANEL - Abnormal; Notable for the following:    Glucose, Bld 103 (*)    Albumin 3.2 (*)    All other  components within normal limits  CBC WITH DIFFERENTIAL - Abnormal; Notable for the following:    Platelets 112 (*)    All other components within normal limits  BASIC METABOLIC PANEL - Abnormal; Notable for the following:    Glucose, Bld 109 (*)    All other components within normal limits  CBC - Abnormal; Notable for the following:    Platelets 111 (*)    All other components within normal limits  TROPONIN I  URINE MICROSCOPIC-ADD ON  TROPONIN I  TROPONIN I  TROPONIN I  TSH  MAGNESIUM  PROTIME-INR  SAVE SMEAR  PATHOLOGIST SMEAR REVIEW  BASIC METABOLIC PANEL   Imaging Review No results found.  EKG Interpretation    Date/Time:  Wednesday June 08 2013 09:14:45 EST Ventricular Rate:  107 PR Interval:    QRS Duration: 113 QT Interval:  385 QTC Calculation: 514 R Axis:   -37 Text Interpretation:  Atrial fibrillation Borderline IVCD with LAD Nonspecific T abnormalities, lateral leads Prolonged QT interval Confirmed by Kathrynn Humble, MD, Berl Bonfanti ([id number]) on 06/08/2013 9:31:55 AM            MDM   1. Acute CHF   2. Atrial fibrillation with rapid ventricular response   3. Acute congestive heart failure    Pt comes in with cc of orthopnea, PND and exertional dib. He is also noted to be in afib with RVR.  Pt has new onset CHF. CXR and BNP confirm the CHF. Unsure if the afib is due to CHF or CHF led to afib. CHADs2 score is 1 - ASA given. Hospitalist to admit, Cards consulted.   Varney Biles, MD 06/14/13 [phone]

## 2013-06-27 ENCOUNTER — Telehealth: Payer: Self-pay | Admitting: Interventional Cardiology

## 2013-06-27 NOTE — Telephone Encounter (Signed)
Pt reports a dull ache over left/center of chest at the end of expiration of a deep breath sick yesterday evening.  He is not SOB.  Had recent cath which showed no coronary disease and EF of 25%. This ache woke him through the night but he otherwise "slept fine".  Denies any recent illness or congestion.  Currently at work.  Spoke with Dr. Mare Ferrari (DOD) who recommends he see primary care, as this does not seem to be cardiac related.  Tried to call patient back at mobile number he gave me.  No answer.

## 2013-06-27 NOTE — Telephone Encounter (Signed)
Called back and spoke with patient.  Gave him Dr. Sherryl Barters recommendation.  Pt does not have a PCP.  Rec an urgent care to him.  He is unhappy with outcome at his last urgent care visit and states he will go to the emergency room for this problem because it involves his heart.  Offered to try to move up his Dr. Ellwood Sayers appointment but he feels this needs addressed today.

## 2013-06-27 NOTE — Telephone Encounter (Signed)
New problem   Pt have chest pain

## 2013-06-29 ENCOUNTER — Ambulatory Visit: Payer: Self-pay | Attending: Internal Medicine | Admitting: Internal Medicine

## 2013-06-29 VITALS — BP 116/77 | HR 87 | Temp 98.9°F | Resp 14 | Ht 71.0 in | Wt 222.6 lb

## 2013-06-29 DIAGNOSIS — I2589 Other forms of chronic ischemic heart disease: Secondary | ICD-10-CM | POA: Insufficient documentation

## 2013-06-29 DIAGNOSIS — I8390 Asymptomatic varicose veins of unspecified lower extremity: Secondary | ICD-10-CM | POA: Insufficient documentation

## 2013-06-29 DIAGNOSIS — I4891 Unspecified atrial fibrillation: Secondary | ICD-10-CM | POA: Insufficient documentation

## 2013-06-29 DIAGNOSIS — I420 Dilated cardiomyopathy: Secondary | ICD-10-CM

## 2013-06-29 DIAGNOSIS — I428 Other cardiomyopathies: Secondary | ICD-10-CM

## 2013-06-29 MED ORDER — APIXABAN 5 MG PO TABS: 5.0000 mg | ORAL_TABLET | Freq: Two times a day (BID) | ORAL | Status: DC

## 2013-06-29 MED ORDER — FUROSEMIDE 40 MG PO TABS: 40.0000 mg | ORAL_TABLET | Freq: Two times a day (BID) | ORAL | Status: DC

## 2013-06-29 MED ORDER — LISINOPRIL 5 MG PO TABS: 5.0000 mg | ORAL_TABLET | Freq: Every day | ORAL | Status: DC

## 2013-06-29 MED ORDER — ATORVASTATIN CALCIUM 40 MG PO TABS: 40.0000 mg | ORAL_TABLET | Freq: Every day | ORAL | Status: DC

## 2013-06-29 NOTE — Progress Notes (Signed)
Patient ID: [name], male   DOB: Jul 22, 1950, 63 y.o.   MRN: [id number]   CC:  HPI: Patient recently admitted to the hospital on 1/74 shortness of breath, had a cardiac catheterization and was found to have nonischemic cardiomyopathy with an EF of 25%. He was seen by Dr. Mare Ferrari in the hospital and was found to have mitral valve regurgitation and severe left atrial dilatation. It was also found to be in atrial fibrillation with rapid ventricular response. 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  Plan is for elective cardioversion after 3 weeks of anticoagulation. The patient has an appointment with cardiology on February 10 He feels great and does not complain of any chest pain shortness of breath He does complain of prominent varicose veins and she has developed over the course of the last one year. He is concerned about a blood clot. He is a nonsmoker nonalcoholic does not use any drugs Is gainfully employed and is very active     No Known Allergies Past Medical History  Diagnosis Date  . Acute CHF 06/08/2013  . Bleeding stomach ulcer 1990's  . OSA on CPAP     "started w/CPAP ~ 03/2011" (06/09/2013)  . Hypertension     "always have been borderling; stopped taking RX awhile back" (06/09/2013)  . Arthritis     "hips" (06/09/2013)  . Gout    Current Outpatient Prescriptions on File Prior to Visit  Medication Sig Dispense Refill  . busPIRone (BUSPAR) 5 MG tablet Take 1 tablet (5 mg total) by mouth 2 (two) times daily.  60 tablet  0   No current facility-administered medications on file prior to visit.   Family History  Problem Relation Age of Onset  . Cancer    . Diabetes     History   Social History  . Marital Status: Married    Spouse Name: N/A    Number of Children: N/A  . Years of Education: N/A   Occupational History  . Not on file.   Social History Main Topics  . Smoking status: Former Smoker -- 2.00 packs/day for 20 years    Types:  Cigarettes  . Smokeless tobacco: Never Used     Comment: 06/09/2013 "stopped smoking cigarettes in the early 1990's"  . Alcohol Use: No  . Drug Use: No  . Sexual Activity: Yes   Other Topics Concern  . Not on file   Social History Narrative  . No narrative on file    Review of Systems  Constitutional: Negative for fever, chills, diaphoresis, activity change, appetite change and fatigue.  HENT: Negative for ear pain, nosebleeds, congestion, facial swelling, rhinorrhea, neck pain, neck stiffness and ear discharge.   Eyes: Negative for pain, discharge, redness, itching and visual disturbance.  Respiratory: Negative for cough, choking, chest tightness, shortness of breath, wheezing and stridor.   Cardiovascular: Negative for chest pain, palpitations and leg swelling.  Gastrointestinal: Negative for abdominal distention.  Genitourinary: Negative for dysuria, urgency, frequency, hematuria, flank pain, decreased urine volume, difficulty urinating and dyspareunia.  Musculoskeletal: Negative for back pain, joint swelling, arthralgias and gait problem.  Neurological: Negative for dizziness, tremors, seizures, syncope, facial asymmetry, speech difficulty, weakness, light-headedness, numbness and headaches.  Hematological: Negative for adenopathy. Does not bruise/bleed easily.  Psychiatric/Behavioral: Negative for hallucinations, behavioral problems, confusion, dysphoric mood, decreased concentration and agitation.    Objective:   Filed Vitals:   06/29/13 1653  BP: 116/77  Pulse: 87  Temp: 98.9 F (37.2 C)  Resp:  14    Physical Exam  Constitutional: Appears well-developed and well-nourished. No distress.  HENT: Normocephalic. External right and left ear normal. Oropharynx is clear and moist.  Eyes: Conjunctivae and EOM are normal. PERRLA, no scleral icterus.  Neck: Normal ROM. Neck supple. No JVD. No tracheal deviation. No thyromegaly.  CVS: RRR, S1/S2 +, no murmurs, no gallops, no  carotid bruit.  Pulmonary: Effort and breath sounds normal, no stridor, rhonchi, wheezes, rales.  Abdominal: Soft. BS +,  no distension, tenderness, rebound or guarding.  Musculoskeletal: Normal range of motion. No edema and no tenderness.  Lymphadenopathy: No lymphadenopathy noted, cervical, inguinal. Neuro: Alert. Normal reflexes, muscle tone coordination. No cranial nerve deficit. Skin: Skin is warm and dry. No rash noted. Not diaphoretic. No erythema. No pallor.  Psychiatric: Normal mood and affect. Behavior, judgment, thought content normal.   Lab Results  Component Value Date   WBC 7.0 06/11/2013   HGB 14.0 06/11/2013   HCT 41.2 06/11/2013   MCV 88.2 06/11/2013   PLT 111* 06/11/2013   Lab Results  Component Value Date   CREATININE 0.86 06/12/2013   BUN 15 06/12/2013   NA 143 06/12/2013   K 4.3 06/12/2013   CL 104 06/12/2013   CO2 27 06/12/2013    No results found for this basename: HGBA1C   Lipid Panel     Component Value Date/Time   CHOL 160 06/09/2013 0238   TRIG 214* 06/09/2013 0238   HDL 22* 06/09/2013 0238   CHOLHDL 7.3 06/09/2013 0238   VLDL 43* 06/09/2013 0238   LDLCALC 95 06/09/2013 0238       Assessment and plan:   Patient Active Problem List   Diagnosis Date Noted  . Sleep apnea- on C-pap 06/09/2013  . HTN (hypertension) 06/09/2013  . Dyslipidemia- HDL 22, LDL 95 06/09/2013  . Acute CHF- etiology not yet determined 06/08/2013  . Atrial fibrillation with rapid ventricular response 06/08/2013   Atrial fibrillation, now stable, rate control Continue beta blocker, eliquis, Cardioversion in 3 weeks Has an appointment with Dr. Irish Lack,   Varicose veins Doppler to rule out DVT Vascular surgery consultation   Ischemic cardiomyopathy continue Lasix and lisinopril  Refills provided on all medications   Follow up in 2 months  The patient was given clear instructions to go to ER or return to medical center if symptoms don't improve, worsen or new problems develop.  The patient verbalized understanding. The patient was told to call to get any lab results if not heard anything in the next week.

## 2013-06-29 NOTE — Progress Notes (Signed)
Pt is here to establish care. Complains of chest pain while inhaling at his lung capacity. Has a history of heart problems. Symptoms have only lasted a few days and is currently having no complaints about any chest pain.

## 2013-07-01 ENCOUNTER — Ambulatory Visit (HOSPITAL_COMMUNITY): Admission: RE | Admit: 2013-07-01 | Payer: Self-pay | Source: Ambulatory Visit

## 2013-07-01 ENCOUNTER — Telehealth: Payer: Self-pay | Admitting: *Deleted

## 2013-07-01 NOTE — Telephone Encounter (Signed)
Contacted pt to inform him of his Vascular appointment 06/21/13 at 4pm. Left a voicemail for pt to give [place] a call back.

## 2013-07-04 ENCOUNTER — Other Ambulatory Visit: Payer: Self-pay

## 2013-07-04 DIAGNOSIS — I83893 Varicose veins of bilateral lower extremities with other complications: Secondary | ICD-10-CM

## 2013-07-12 ENCOUNTER — Encounter: Payer: Self-pay | Admitting: Cardiology

## 2013-07-12 ENCOUNTER — Ambulatory Visit (INDEPENDENT_AMBULATORY_CARE_PROVIDER_SITE_OTHER): Payer: Self-pay | Admitting: Interventional Cardiology

## 2013-07-12 ENCOUNTER — Encounter: Payer: Self-pay | Admitting: Interventional Cardiology

## 2013-07-12 VITALS — BP 142/72 | HR 60 | Ht 71.0 in | Wt 224.0 lb

## 2013-07-12 DIAGNOSIS — I509 Heart failure, unspecified: Secondary | ICD-10-CM

## 2013-07-12 DIAGNOSIS — I4891 Unspecified atrial fibrillation: Secondary | ICD-10-CM

## 2013-07-12 DIAGNOSIS — I10 Essential (primary) hypertension: Secondary | ICD-10-CM

## 2013-07-12 MED ORDER — METOPROLOL TARTRATE 50 MG PO TABS
50.0000 mg | ORAL_TABLET | Freq: Two times a day (BID) | ORAL | Status: DC
Start: 2013-07-12 — End: 2014-03-09

## 2013-07-12 NOTE — Patient Instructions (Signed)
Your physician has recommended that you have a Cardioversion (DCCV). Electrical Cardioversion uses a jolt of electricity to your heart either through paddles or wired patches attached to your chest. This is a controlled, usually prescheduled, procedure. Defibrillation is done under light anesthesia in the hospital, and you usually go home the day of the procedure. This is done to get your heart back into a normal rhythm. You are not awake for the procedure. Please see the instruction sheet given to you today.  Your physician recommends that you return for lab work TODAY for Bmet, CBC with diff and PT/INR.  Your physician has recommended you make the following change in your medication:   1. Start Metoprolol Tartrate 50 mg 1 tablet by mouth twice a day.   I will call you tomorrow with the date and time of Cardioversion. We are planning on doing procedure Thursday 07/14/13.   Samples of Eliquis given to you today.

## 2013-07-12 NOTE — Progress Notes (Signed)
Patient ID: [name], male   DOB: [birth year], 63 y.o.   MRN: [id number]    [place], [place], [place]  [address] Phone: [phone] Fax:  [phone]  Date:  07/12/2013   [name], [name] 1951-02-26, MRN [id number]  PCP:  Lorayne Marek, MD      History of Present Illness: [name] is a 63 y.o. male who was admitted to the hospital with acute heart failure. He was found to have a low ejection fraction. It was thought to be due to too persistent tachycardia from undiagnosed atrial fibrillation. He was diuresed. He feels much better now. When he left the hospital, he was not sent home any rate control medication. He denies any palpitations. He has not had any significant shortness of breath like he had prior to admission. He denies any chest pain. No problems at his left radial cath site.     Wt Readings from Last 3 Encounters:  07/12/13 224 lb (101.606 kg)  06/29/13 222 lb 9.6 oz (100.971 kg)  06/12/13 219 lb 4.8 oz (99.474 kg)     Past Medical History  Diagnosis Date  . Acute CHF 06/08/2013  . Bleeding stomach ulcer 1990's  . OSA on CPAP     "started w/CPAP ~ 03/2011" (06/09/2013)  . Hypertension     "always have been borderling; stopped taking RX awhile back" (06/09/2013)  . Arthritis     "hips" (06/09/2013)  . Gout     Current Outpatient Prescriptions  Medication Sig Dispense Refill  . apixaban (ELIQUIS) 5 MG TABS tablet Take 1 tablet (5 mg total) by mouth 2 (two) times daily.  60 tablet  6  . atorvastatin (LIPITOR) 40 MG tablet Take 1 tablet (40 mg total) by mouth daily at 6 PM.  30 tablet  3  . busPIRone (BUSPAR) 5 MG tablet Take 1 tablet (5 mg total) by mouth 2 (two) times daily.  60 tablet  0  . furosemide (LASIX) 40 MG tablet Take 1 tablet (40 mg total) by mouth 2 (two) times daily.  60 tablet  3  . lisinopril (PRINIVIL,ZESTRIL) 5 MG tablet Take 1 tablet (5 mg total) by mouth daily.  30 tablet  3   No current facility-administered  medications for this visit.    Allergies:   No Known Allergies  Social History:  The patient  reports that he has quit smoking. His smoking use included Cigarettes. He has a 40 pack-year smoking history. He has never used smokeless tobacco. He reports that he does not drink alcohol or use illicit drugs.   Family History:  The patient's family history includes Cancer in an other family member; Diabetes in an other family member.   ROS:  Please see the history of present illness.  No nausea, vomiting.  No fevers, chills.  No focal weakness.  No dysuria.   All other systems reviewed and negative.   PHYSICAL EXAM: VS:  BP 142/72  Pulse 60  Ht 5\' 11"  (1.803 m)  Wt 224 lb (101.606 kg)  BMI 31.26 kg/m2 Well nourished, well developed, in no acute distress HEENT: normal Neck: no JVD, no carotid bruits Cardiac:  normal S1, S2; RRR;  Lungs:  clear to auscultation bilaterally, no wheezing, rhonchi or rales Abd: soft, nontender, no hepatomegaly Ext: no edema Skin: warm and dry Neuro:   no focal abnormalities noted       ASSESSMENT AND PLAN:  1. Nonischemic cardiomyopathy: Likely due  to tachycardia. Start metoprolol 50 mg by mouth twice a day.  Continue lisinopril. His metoprolol dose was increased to 75 mg twice a day in the hospital. It is unclear why he was sent home without any AV nodal blocking agent.  There is no documented bradycardia. He denies any lightheadedness or syncope. 2. Coronary artery disease: I reviewed his cath report. He had moderate LAD disease. This did not explain his cardiomyopathy. He has not had any anginal symptoms. We talked about preventive care to make sure that his coronary artery disease does not progress. 3. Atrial fibrillation: Continue anticoagulation with apixaban.  Add metoprolol for rate control. We'll plan for cardioversion. He is agreeable.  He had a markedly dilated left atrium. He had moderate mitral regurgitation. This may decrease the likelihood of  maintaining sinus rhythm. Will try cardioversion without antiarrhythmic at this point. If he does not maintain sinus rhythm, would have to consider adding an antiarrhythmic. 4. Blood pressure mildly elevated today. Continue lisinopril. Hopefully adding metoprolol will help high blood pressure.  Signed, Mina Marble, MD, [facility] 07/12/2013 4:52 PM

## 2013-07-13 ENCOUNTER — Ambulatory Visit: Payer: Self-pay | Attending: Internal Medicine

## 2013-07-13 ENCOUNTER — Ambulatory Visit: Payer: Self-pay | Admitting: Internal Medicine

## 2013-07-13 ENCOUNTER — Telehealth: Payer: Self-pay | Admitting: Cardiology

## 2013-07-13 LAB — BASIC METABOLIC PANEL
BUN: 20 mg/dL (ref 6–23)
CO2: 24 mEq/L (ref 19–32)
CREATININE: 0.9 mg/dL (ref 0.4–1.5)
Calcium: 9.1 mg/dL (ref 8.4–10.5)
Chloride: 106 mEq/L (ref 96–112)
GFR: 96.3 mL/min (ref >60.00)
Glucose, Bld: 89 mg/dL (ref 70–99)
Potassium: 3.9 mEq/L (ref 3.5–5.1)
Sodium: 140 mEq/L (ref 135–145)

## 2013-07-13 LAB — CBC WITH DIFFERENTIAL/PLATELET
BASOS PCT: 0.9 % (ref 0.0–3.0)
Basophils Absolute: 0.1 10*3/uL (ref 0.0–0.1)
Eosinophils Absolute: 0.1 10*3/uL (ref 0.0–0.7)
Eosinophils Relative: 1 % (ref 0.0–5.0)
HEMATOCRIT: 46.3 % (ref 39.0–52.0)
HEMOGLOBIN: 15 g/dL (ref 13.0–17.0)
Lymphocytes Relative: 18.1 % (ref 12.0–46.0)
Lymphs Abs: 1.7 10*3/uL (ref 0.7–4.0)
MCHC: 32.4 g/dL (ref 30.0–36.0)
MCV: 92.1 fl (ref 78.0–100.0)
MONO ABS: 0.7 10*3/uL (ref 0.1–1.0)
Monocytes Relative: 7.8 % (ref 3.0–12.0)
NEUTROS ABS: 6.9 10*3/uL (ref 1.4–7.7)
NEUTROS PCT: 72.2 % (ref 43.0–77.0)
Platelets: 124 10*3/uL — ABNORMAL LOW (ref 150.0–400.0)
RBC: 5.02 Mil/uL (ref 4.22–5.81)
RDW: 14.2 % (ref 11.5–14.6)
WBC: 9.6 10*3/uL (ref 4.5–10.5)

## 2013-07-13 LAB — PROTIME-INR
INR: 1.3 ratio — ABNORMAL HIGH (ref 0.8–1.0)
PROTHROMBIN TIME: 13.4 s — AB (ref 10.2–12.4)

## 2013-07-13 NOTE — Telephone Encounter (Signed)
Cardioversion scheduled for 07/21/13 at 10am. LM for pt to call to go over instructions. Pt had labwork yesterday.

## 2013-07-15 NOTE — Telephone Encounter (Addendum)
R/s cardioversion for 07/22/13 at 1pm.

## 2013-07-18 NOTE — Telephone Encounter (Signed)
Pt aware, labs completed and instructions give.

## 2013-07-22 ENCOUNTER — Encounter (HOSPITAL_COMMUNITY)
Admission: RE | Disposition: A | Discharge status: Home / Self Care | Payer: Self-pay | Source: Ambulatory Visit | Attending: Interventional Cardiology

## 2013-07-22 ENCOUNTER — Encounter (HOSPITAL_COMMUNITY): Payer: Self-pay | Admitting: Critical Care Medicine

## 2013-07-22 ENCOUNTER — Ambulatory Visit (HOSPITAL_COMMUNITY)
Admission: RE | Admit: 2013-07-22 | Discharge: 2013-07-22 | Disposition: A | Discharge status: Home / Self Care | Payer: Self-pay | Source: Ambulatory Visit | Attending: Interventional Cardiology | Admitting: Interventional Cardiology

## 2013-07-22 ENCOUNTER — Ambulatory Visit (HOSPITAL_COMMUNITY): Payer: Self-pay | Admitting: Critical Care Medicine

## 2013-07-22 DIAGNOSIS — I4891 Unspecified atrial fibrillation: Secondary | ICD-10-CM | POA: Insufficient documentation

## 2013-07-22 DIAGNOSIS — Z87891 Personal history of nicotine dependence: Secondary | ICD-10-CM | POA: Insufficient documentation

## 2013-07-22 DIAGNOSIS — Z7901 Long term (current) use of anticoagulants: Secondary | ICD-10-CM | POA: Insufficient documentation

## 2013-07-22 DIAGNOSIS — M109 Gout, unspecified: Secondary | ICD-10-CM | POA: Insufficient documentation

## 2013-07-22 DIAGNOSIS — I10 Essential (primary) hypertension: Secondary | ICD-10-CM | POA: Insufficient documentation

## 2013-07-22 DIAGNOSIS — M129 Arthropathy, unspecified: Secondary | ICD-10-CM | POA: Insufficient documentation

## 2013-07-22 DIAGNOSIS — I428 Other cardiomyopathies: Secondary | ICD-10-CM | POA: Insufficient documentation

## 2013-07-22 DIAGNOSIS — I509 Heart failure, unspecified: Secondary | ICD-10-CM | POA: Insufficient documentation

## 2013-07-22 DIAGNOSIS — G4733 Obstructive sleep apnea (adult) (pediatric): Secondary | ICD-10-CM | POA: Insufficient documentation

## 2013-07-22 HISTORY — DX: Residual foreign body in soft tissue: M79.5

## 2013-07-22 HISTORY — PX: CARDIOVERSION: SHX1299

## 2013-07-22 SURGERY — CARDIOVERSION
Anesthesia: Monitor Anesthesia Care

## 2013-07-22 MED ORDER — SODIUM CHLORIDE 0.9 % IV SOLN
INTRAVENOUS | Status: DC
  Administered 2013-07-22: 13:00:00 via INTRAVENOUS

## 2013-07-22 MED ORDER — HYDROCORTISONE 1 % EX CREA: 1.0000 "application " | TOPICAL_CREAM | Freq: Three times a day (TID) | CUTANEOUS | Status: DC | PRN

## 2013-07-22 MED ORDER — LIDOCAINE HCL (CARDIAC) 20 MG/ML IV SOLN
INTRAVENOUS | Status: DC | PRN
  Administered 2013-07-22: 40 mg via INTRAVENOUS

## 2013-07-22 MED ORDER — PROPOFOL 10 MG/ML IV BOLUS
INTRAVENOUS | Status: DC | PRN
  Administered 2013-07-22: 40 mg via INTRAVENOUS
  Administered 2013-07-22: 50 mg via INTRAVENOUS

## 2013-07-22 NOTE — Interval H&P Note (Signed)
History and Physical Interval Note:  07/22/2013 1:22 PM  [name]  has presented today for surgery, with the diagnosis of AFIB  The various methods of treatment have been discussed with the patient and family. After consideration of risks, benefits and other options for treatment, the patient has consented to  Procedure(s): CARDIOVERSION (N/A) as a surgical intervention .  The patient's history has been reviewed, patient examined, no change in status, stable for surgery.  I have reviewed the patient's chart and labs.  Questions were answered to the patient's satisfaction.     [name]

## 2013-07-22 NOTE — Preoperative (Signed)
Beta Blockers   Reason not to administer Beta Blockers:Not Applicable, pt took 7/61/47

## 2013-07-22 NOTE — CV Procedure (Signed)
Electrical Cardioversion Procedure Note [name] [id number] Aug 16, 1950  Procedure: Electrical Cardioversion Indications:  Atrial Fibrillation  Time Out: Verified patient identification, verified procedure,medications/allergies/relevent history reviewed, required imaging and test results available.  Performed  Procedure Details  The patient was NPO after midnight. Anesthesia was administered at the beside  by Dr. Oletta Lamas with 90mg  of propofol, 40 mg Lidocaine.  Cardioversion was done with synchronized biphasic defibrillation with AP pads with 120 J, 150 J without success.  Then a 200 J biphasic shock was successful at restoring NSR.  The patient converted to normal sinus rhythm. The patient tolerated the procedure well   IMPRESSION:  Successful cardioversion of atrial fibrillation to normal sinus rhythm after 3 defibrillations.  Continue Eliquis for at least a month.  VARANASI,JAYADEEP S. 07/22/2013, 1:31 PM

## 2013-07-22 NOTE — Transfer of Care (Signed)
Immediate Anesthesia Transfer of Care Note  Patient: [name]  Procedure(s) Performed: Procedure(s) with comments: CARDIOVERSION (N/A) - 40  Patient Location: Endoscopy Unit  Anesthesia Type:MAC  Level of Consciousness: awake, alert  and oriented  Airway & Oxygen Therapy: Patient Spontanous Breathing  Post-op Assessment: Report given to PACU RN, Post -op Vital signs reviewed and stable and Patient moving all extremities X 4  Post vital signs: Reviewed and stable  Complications: No apparent anesthesia complications

## 2013-07-22 NOTE — Anesthesia Postprocedure Evaluation (Signed)
  Anesthesia Post-op Note  Patient: [name]  Procedure(s) Performed: Procedure(s) with comments: CARDIOVERSION (N/A) - 13:26 synched cardioversion after Lido 40mg , IV and propofol 80 mg,IV given. 120 joules, unsuccessful, 13:29 150 joules unsuccessful,..13:30 200 joules in SR. 12 lead ordered to verify  Patient Location: Endoscopy Unit  Anesthesia Type:MAC  Level of Consciousness: awake, alert  and oriented  Airway and Oxygen Therapy: Patient Spontanous Breathing  Post-op Pain: none  Post-op Assessment: Post-op Vital signs reviewed, [name] Cardiovascular Status Stable, Respiratory Function Stable, Patent Airway and NAUSEA AND VOMITING PRESENT  Post-op Vital Signs: Reviewed and stable  Complications: No apparent anesthesia complications

## 2013-07-22 NOTE — H&P (View-Only) (Signed)
Patient ID: [name], male   DOB: [birth year], 63 y.o.   MRN: [id number]    [place], [place], [place]  [address] Phone: [phone] Fax:  [phone]  Date:  07/12/2013   [name], [name] 1951-02-26, MRN [id number]  PCP:  Lorayne Marek, MD      History of Present Illness: [name] is a 63 y.o. male who was admitted to the hospital with acute heart failure. He was found to have a low ejection fraction. It was thought to be due to too persistent tachycardia from undiagnosed atrial fibrillation. He was diuresed. He feels much better now. When he left the hospital, he was not sent home any rate control medication. He denies any palpitations. He has not had any significant shortness of breath like he had prior to admission. He denies any chest pain. No problems at his left radial cath site.     Wt Readings from Last 3 Encounters:  07/12/13 224 lb (101.606 kg)  06/29/13 222 lb 9.6 oz (100.971 kg)  06/12/13 219 lb 4.8 oz (99.474 kg)     Past Medical History  Diagnosis Date  . Acute CHF 06/08/2013  . Bleeding stomach ulcer 1990's  . OSA on CPAP     "started w/CPAP ~ 03/2011" (06/09/2013)  . Hypertension     "always have been borderling; stopped taking RX awhile back" (06/09/2013)  . Arthritis     "hips" (06/09/2013)  . Gout     Current Outpatient Prescriptions  Medication Sig Dispense Refill  . apixaban (ELIQUIS) 5 MG TABS tablet Take 1 tablet (5 mg total) by mouth 2 (two) times daily.  60 tablet  6  . atorvastatin (LIPITOR) 40 MG tablet Take 1 tablet (40 mg total) by mouth daily at 6 PM.  30 tablet  3  . busPIRone (BUSPAR) 5 MG tablet Take 1 tablet (5 mg total) by mouth 2 (two) times daily.  60 tablet  0  . furosemide (LASIX) 40 MG tablet Take 1 tablet (40 mg total) by mouth 2 (two) times daily.  60 tablet  3  . lisinopril (PRINIVIL,ZESTRIL) 5 MG tablet Take 1 tablet (5 mg total) by mouth daily.  30 tablet  3   No current facility-administered  medications for this visit.    Allergies:   No Known Allergies  Social History:  The patient  reports that he has quit smoking. His smoking use included Cigarettes. He has a 40 pack-year smoking history. He has never used smokeless tobacco. He reports that he does not drink alcohol or use illicit drugs.   Family History:  The patient's family history includes Cancer in an other family member; Diabetes in an other family member.   ROS:  Please see the history of present illness.  No nausea, vomiting.  No fevers, chills.  No focal weakness.  No dysuria.   All other systems reviewed and negative.   PHYSICAL EXAM: VS:  BP 142/72  Pulse 60  Ht 5\' 11"  (1.803 m)  Wt 224 lb (101.606 kg)  BMI 31.26 kg/m2 Well nourished, well developed, in no acute distress HEENT: normal Neck: no JVD, no carotid bruits Cardiac:  normal S1, S2; RRR;  Lungs:  clear to auscultation bilaterally, no wheezing, rhonchi or rales Abd: soft, nontender, no hepatomegaly Ext: no edema Skin: warm and dry Neuro:   no focal abnormalities noted       ASSESSMENT AND PLAN:  1. Nonischemic cardiomyopathy: Likely due  to tachycardia. Start metoprolol 50 mg by mouth twice a day.  Continue lisinopril. His metoprolol dose was increased to 75 mg twice a day in the hospital. It is unclear why he was sent home without any AV nodal blocking agent.  There is no documented bradycardia. He denies any lightheadedness or syncope. 2. Coronary artery disease: I reviewed his cath report. He had moderate LAD disease. This did not explain his cardiomyopathy. He has not had any anginal symptoms. We talked about preventive care to make sure that his coronary artery disease does not progress. 3. Atrial fibrillation: Continue anticoagulation with apixaban.  Add metoprolol for rate control. We'll plan for cardioversion. He is agreeable.  He had a markedly dilated left atrium. He had moderate mitral regurgitation. This may decrease the likelihood of  maintaining sinus rhythm. Will try cardioversion without antiarrhythmic at this point. If he does not maintain sinus rhythm, would have to consider adding an antiarrhythmic. 4. Blood pressure mildly elevated today. Continue lisinopril. Hopefully adding metoprolol will help high blood pressure.  Signed, Trevor S. Arrion Broaddus, MD, FACC 07/12/2013 4:52 PM   

## 2013-07-22 NOTE — Discharge Instructions (Signed)
Can use hydrocortisone creme for skin irritation.Electrical Cardioversion, Care After Refer to this sheet in the next few weeks. These instructions provide you with information on caring for yourself after your procedure. Your health care provider may also give you more specific instructions. Your treatment has been planned according to current medical practices, but problems sometimes occur. Call your health care provider if you have any problems or questions after your procedure. WHAT TO EXPECT AFTER THE PROCEDURE After your procedure, it is typical to have the following sensations:  Some redness on the skin where the shocks were delivered. If this is tender, a sunburn lotion or hydrocortisone cream may help.  Possible return of an abnormal heart rhythm within hours or days after the procedure. HOME CARE INSTRUCTIONS  Only take medicine as directed by your health care provider. Be sure you understand how and when to take your medicine.  Learn how to feel your pulse and check it often.  Limit your activity for 48 hours after the procedure or as directed.  Avoid or minimize caffeine and other stimulants as directed. SEEK MEDICAL CARE IF:  You feel like your heart is beating too fast or your pulse is not regular.  You have any questions about your medicines.  You have bleeding that will not stop. SEEK IMMEDIATE MEDICAL CARE IF:  You are dizzy or feel faint.  It is hard to breathe or you feel short of breath.  There is a change in discomfort in your chest.  Your speech is slurred or you have trouble moving an arm or leg on one side of your body.  You get a serious muscle cramp that does not go away.  Your fingers or toes turn cold or blue. MAKE SURE YOU:   Understand these instructions.   Will watch your condition.   Will get help right away if you are not doing well or get worse. Document Released: 03/09/2013 Document Reviewed: 12/01/2012 [facility] Patient Information  2014 [place], [place].

## 2013-07-22 NOTE — Anesthesia Preprocedure Evaluation (Signed)
Anesthesia Evaluation  Patient identified by MRN, date of birth, ID band Patient awake    Reviewed: Allergy & Precautions, H&P , NPO status , [name] Chart, lab work & pertinent test results  Airway       Dental  (+) Dental Advisory Given   Pulmonary sleep apnea and Continuous Positive Airway Pressure Ventilation , former smoker,          Cardiovascular hypertension, +CHF     Neuro/Psych    GI/Hepatic PUD,   Endo/Other    Renal/GU      Musculoskeletal  (+) Arthritis -,   Abdominal   Peds  Hematology   Anesthesia Other Findings   Reproductive/Obstetrics                           Anesthesia Physical Anesthesia Plan  ASA: III  Anesthesia Plan: MAC   Post-op Pain Management:    Induction: Intravenous  Airway Management Planned: Mask  Additional Equipment:   Intra-op Plan:   Post-operative Plan:   Informed Consent: I have reviewed the patients History and Physical, chart, labs and discussed the procedure including the risks, benefits and alternatives for the proposed anesthesia with the patient or authorized representative who has indicated his/her understanding and acceptance.   Dental advisory given  Plan Discussed with: Anesthesiologist and Surgeon  Anesthesia Plan Comments:         Anesthesia Quick Evaluation

## 2013-07-22 NOTE — Discharge Summary (Signed)
Physician Discharge Summary  [name] CVE:[id number] DOB: 10/26/1950 DOA: 06/08/2013  PCP: Lorayne Marek, MD  Admit date: 06/08/2013 Discharge date: 07/22/2013  Time spent: 30 minutes  Recommendations for Outpatient Follow-up:  1. Needs Meds and care coordination as OP 2. Follow up cardiology soon  3. Needs Labs in 1 week  Discharge Diagnoses:  Principal Problem:   Acute CHF- etiology not yet determined Active Problems:   Atrial fibrillation with rapid ventricular response   Sleep apnea- on C-pap   HTN (hypertension)   Dyslipidemia- HDL 22, LDL 95   Discharge Condition: fair  Diet recommendation: heart healthy  Filed Weights   06/10/13 0535 06/11/13 0545 06/12/13 0609  Weight: 100.8 kg (222 lb 3.6 oz) 99.8 kg (220 lb 0.3 oz) 99.474 kg (219 lb 4.8 oz)    History of present illness:  63 y/o ?, benign past medical history, admitted with new onset CHF, paroxysmal atrial fibrillation, [place] score 1.  Recently seen at [facility] urgent care by FNP, treated for bronchitis returned and was noted to have high blood pressure as well as increasing fluid given one week course of on metoprolol, Lasix which he ran out of.   Hospital Course:  Assessment/Plan:  1. Nonischemic cardiomyopathy with systolic dysfunction-potentially driven by atrial fibrillation. App cardiology input-?OP Cardioversion. Apixiban 45 bid till OV 2. Acute decompensated systolic heart failure-etiology resulting from atrial tachycardia 3. Anxiety-situational-Added Buspar bid on 1/8 4. P Afib, Chad2vasc 1- DC vs Chem cardioversion-d/c on metoprolol to 50mg  twice a day-Dr. Mare Ferrari recommends OP follow up for consideration cardioverison 5. Hypertension-continue ACE inhibitor low dose 2.5 mg daily in addition to above medications 6. Hyperlipidemia-needs dietary education, triglycerides 214, LDL 95, needs at least moderate intensity statin, Lipitor 40 7. Mild thrombocytopenia-get peripheral smear. OP follow  up. Discuss c Hem onc if drops below 100   Procedures: echo  Consultations:   Cardiology  Discharge Exam: Filed Vitals:   06/12/13 1454  BP: 130/96  Pulse: 64  Temp: 97.9 F (36.6 C)  Resp: 20   Alert pleasant oriented in NAD   Discharge Instructions  Discharge Orders   Future Appointments Provider Department Dept Phone   07/26/2013 5:15 PM Lorayne Marek, [place] [phone]   08/03/2013 1:30 PM [facility] [place] [phone]   08/03/2013 2:30 PM Angelia Mould, MD [facility] [phone]   08/29/2013 5:00 PM Lorayne Marek, MD [place] [phone]   Future Orders Complete By Expires   Diet - low sodium heart healthy  As directed    Discharge instructions  As directed    Comments:     Call the [facility] center to get  Appointment as soon as possible You've been given a limited prescription of all of the medications that you need for discharge, please get all of these medications Please follow up with cardiologist   Increase activity slowly  As directed        Medication List    STOP taking these medications       aspirin EC 81 MG tablet     furosemide 40 MG tablet  Commonly known as:  LASIX     metoprolol succinate 50 MG 24 hr tablet  Commonly known as:  TOPROL-XL     omega-3 acid ethyl esters 1 G capsule  Commonly known as:  LOVAZA      TAKE these medications  busPIRone 5 MG tablet  Commonly known as:  BUSPAR  Take 1 tablet (5 mg total) by mouth 2 (two) times daily.       No Known Allergies    The results of significant diagnostics from this hospitalization (including imaging, microbiology, ancillary and laboratory) are listed below for reference.    Significant Diagnostic Studies: No results found.  Microbiology: No results found for this or any previous visit (from the past 240 hour(s)).    Labs: Basic Metabolic Panel: No results found for this basename: NA, K, CL, CO2, GLUCOSE, BUN, CREATININE, CALCIUM, MG, PHOS,  in the last 168 hours Liver Function Tests: No results found for this basename: AST, ALT, ALKPHOS, BILITOT, PROT, ALBUMIN,  in the last 168 hours No results found for this basename: LIPASE, AMYLASE,  in the last 168 hours No results found for this basename: AMMONIA,  in the last 168 hours CBC: No results found for this basename: WBC, NEUTROABS, HGB, HCT, MCV, PLT,  in the last 168 hours Cardiac Enzymes: No results found for this basename: CKTOTAL, CKMB, CKMBINDEX, TROPONINI,  in the last 168 hours BNP: BNP (last 3 results)  Recent Labs  06/08/13 0918 06/09/13 0238  PROBNP 2698.0* 1919.0*   CBG: No results found for this basename: GLUCAP,  in the last 168 hours     Signed:  Nita Sells  [facility] 07/22/2013, 4:02 PM

## 2013-07-25 ENCOUNTER — Encounter (HOSPITAL_COMMUNITY): Payer: Self-pay | Admitting: Interventional Cardiology

## 2013-07-26 ENCOUNTER — Ambulatory Visit: Payer: Self-pay | Admitting: Internal Medicine

## 2013-08-02 ENCOUNTER — Encounter: Payer: Self-pay | Admitting: Vascular Surgery

## 2013-08-03 ENCOUNTER — Inpatient Hospital Stay (HOSPITAL_COMMUNITY): Admission: RE | Admit: 2013-08-03 | Payer: Self-pay | Source: Ambulatory Visit

## 2013-08-03 ENCOUNTER — Encounter: Payer: Self-pay | Admitting: Vascular Surgery

## 2013-08-09 MED ORDER — BUSPIRONE HCL 5 MG PO TABS: 5.0000 mg | ORAL_TABLET | Freq: Two times a day (BID) | ORAL | Status: DC

## 2013-08-09 NOTE — Addendum Note (Signed)
Addended by: Allyson Sabal MD, Ascencion Dike on: 08/09/2013 10:42 AM   Modules accepted: Orders

## 2013-08-12 ENCOUNTER — Telehealth: Payer: Self-pay | Admitting: *Deleted

## 2013-08-12 NOTE — Telephone Encounter (Signed)
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 2 week supply left for pt at front desk, pt and Pharmacist at [facility] aware. Please advise for ok switch to coumadin or Xarelto if pt meet income requirement for Xarelto.

## 2013-08-12 NOTE — Telephone Encounter (Signed)
[facility] to see if patient could get a refill on coumadin until he can get the eliquis for free from the company. Please advise. Thanks, MI

## 2013-08-15 NOTE — Telephone Encounter (Signed)
OK to switch to Coumadin or Xarelto per pharmD dosing.

## 2013-08-17 NOTE — Telephone Encounter (Signed)
Due to cost will need to change to warfarin, and overlap with Eliquis for 2 days.  Will need to call patient and set this up.

## 2013-08-23 ENCOUNTER — Ambulatory Visit: Payer: Self-pay | Admitting: Interventional Cardiology

## 2013-08-24 NOTE — Telephone Encounter (Signed)
Pt has been called several times in past week to inform him that we will have to switch him to coumadin since Eliquis is too costly and his income is too great for pt assistance. Was able to reach pt on 08/19/13 and scheduler made appt with Dr Irish Lack for 08/23/13 and at that time we were going to discuss anticoagulation and the switch, but pt cancelled appt. 08/24/13- Attempted to call pt this morning and left messages on his phone and spouse's.

## 2013-08-26 NOTE — Telephone Encounter (Signed)
Amy, please call patient and get him scheduled to see Dr. Irish Lack for his f/u appointment that patient cancelled this week.  Let me know when he gets scheduled as we will need to see him the same day to discuss changing to warfarin vs continuing Eliquis vs changing to Xarelto.  Cost has been a concern so will likely need to change to warfarin.  He should have samples of Eliquis to last another 7-10 days, you may need to confirm this.  If we can't get him in to see Dr. Irish Lack before he runs out, we can try to get him some samples.  Just let me know when you are able to get him scheduled to see Dr. Irish Lack, and stress to patient importance of keeping appointment and not running out of blood thinner.  Thanks.

## 2013-08-26 NOTE — Telephone Encounter (Signed)
Lmtrc, appt scheduled for 09/01/13 at 4:45 pm with arrival time of 4:30pm.

## 2013-08-29 ENCOUNTER — Ambulatory Visit: Payer: Self-pay | Admitting: Internal Medicine

## 2013-08-29 NOTE — Telephone Encounter (Signed)
lmtrc

## 2013-08-30 ENCOUNTER — Ambulatory Visit: Payer: Self-pay | Admitting: Interventional Cardiology

## 2013-08-30 NOTE — Telephone Encounter (Signed)
Lmtrc, appt scheduled for today. Called yesterday and lm with appt. I have still not been able to get in touch with patient or pts wife.

## 2013-08-31 NOTE — Telephone Encounter (Signed)
Spoke with pt and he has 26 pills left of Eliquis.

## 2013-08-31 NOTE — Telephone Encounter (Signed)
Pt cant come for appt this week. Appt made for 09/14/13 with Richardson Dopp, PA. Pt will call if he thinks he might run out of Eliquis so that we give him more samples. Also, I told pt if an appt becomes available for next week I will call him and get him in.

## 2013-09-01 ENCOUNTER — Ambulatory Visit: Payer: Self-pay | Admitting: Interventional Cardiology

## 2013-09-12 ENCOUNTER — Telehealth: Payer: Self-pay | Admitting: *Deleted

## 2013-09-12 NOTE — Telephone Encounter (Signed)
eliquis samples given to patient to last him until Wednesday, he states he will have insurance end of May, I will leave him patient assistance forms for eliquis through [facility]

## 2013-09-14 ENCOUNTER — Encounter: Payer: Self-pay | Admitting: Physician Assistant

## 2013-09-14 ENCOUNTER — Ambulatory Visit: Payer: Self-pay | Admitting: Physician Assistant

## 2013-09-14 ENCOUNTER — Ambulatory Visit (INDEPENDENT_AMBULATORY_CARE_PROVIDER_SITE_OTHER): Payer: Self-pay | Admitting: Physician Assistant

## 2013-09-14 VITALS — BP 126/72 | HR 95 | Ht 71.0 in | Wt 225.0 lb

## 2013-09-14 DIAGNOSIS — I5022 Chronic systolic (congestive) heart failure: Secondary | ICD-10-CM

## 2013-09-14 DIAGNOSIS — Z79899 Other long term (current) drug therapy: Secondary | ICD-10-CM

## 2013-09-14 DIAGNOSIS — E785 Hyperlipidemia, unspecified: Secondary | ICD-10-CM

## 2013-09-14 DIAGNOSIS — I428 Other cardiomyopathies: Secondary | ICD-10-CM

## 2013-09-14 DIAGNOSIS — G4730 Sleep apnea, unspecified: Secondary | ICD-10-CM

## 2013-09-14 DIAGNOSIS — I10 Essential (primary) hypertension: Secondary | ICD-10-CM

## 2013-09-14 DIAGNOSIS — I4891 Unspecified atrial fibrillation: Secondary | ICD-10-CM | POA: Insufficient documentation

## 2013-09-14 MED ORDER — AMIODARONE HCL 200 MG PO TABS: 200.0000 mg | ORAL_TABLET | Freq: Two times a day (BID) | ORAL | Status: DC
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
## 2013-09-14 NOTE — Patient Instructions (Addendum)
**Note De-Identified [name] Obfuscation** Your physician has recommended you make the following change in your medication: start taking Amiodarone 200 mg twice daily  Your physician recommends that you return for lab work in: on same day as your follow up with Richardson Dopp on 5/14.   Your physician recommends that you schedule a follow-up appointment in: 1 to 2 weeks and in 1 month with Richardson Dopp PA-C.

## 2013-09-14 NOTE — Telephone Encounter (Signed)
Pt seen in office today by Richardson Dopp, PA.

## 2013-09-26 ENCOUNTER — Ambulatory Visit (INDEPENDENT_AMBULATORY_CARE_PROVIDER_SITE_OTHER): Payer: Self-pay | Admitting: Physician Assistant

## 2013-09-26 ENCOUNTER — Ambulatory Visit: Payer: Self-pay | Admitting: Physician Assistant

## 2013-09-26 ENCOUNTER — Encounter: Payer: Self-pay | Admitting: Physician Assistant

## 2013-09-26 VITALS — BP 142/88 | HR 85 | Ht 71.0 in | Wt 225.0 lb

## 2013-09-26 DIAGNOSIS — I5022 Chronic systolic (congestive) heart failure: Secondary | ICD-10-CM

## 2013-09-26 DIAGNOSIS — E785 Hyperlipidemia, unspecified: Secondary | ICD-10-CM

## 2013-09-26 DIAGNOSIS — I428 Other cardiomyopathies: Secondary | ICD-10-CM

## 2013-09-26 DIAGNOSIS — R82998 Other abnormal findings in urine: Secondary | ICD-10-CM

## 2013-09-26 DIAGNOSIS — G4730 Sleep apnea, unspecified: Secondary | ICD-10-CM

## 2013-09-26 DIAGNOSIS — Z7901 Long term (current) use of anticoagulants: Secondary | ICD-10-CM

## 2013-09-26 DIAGNOSIS — I4891 Unspecified atrial fibrillation: Secondary | ICD-10-CM

## 2013-09-26 DIAGNOSIS — I10 Essential (primary) hypertension: Secondary | ICD-10-CM

## 2013-09-26 DIAGNOSIS — R3989 Other symptoms and signs involving the genitourinary system: Secondary | ICD-10-CM

## 2013-09-26 LAB — CBC WITH DIFFERENTIAL/PLATELET
Basophils Absolute: 0 10*3/uL (ref 0.0–0.1)
Basophils Relative: 0.5 % (ref 0.0–3.0)
Eosinophils Absolute: 0.1 10*3/uL (ref 0.0–0.7)
Eosinophils Relative: 1.6 % (ref 0.0–5.0)
HCT: 42.5 % (ref 39.0–52.0)
Hemoglobin: 14.4 g/dL (ref 13.0–17.0)
LYMPHS PCT: 21.7 % (ref 12.0–46.0)
Lymphs Abs: 1.6 10*3/uL (ref 0.7–4.0)
MCHC: 33.9 g/dL (ref 30.0–36.0)
MCV: 88.3 fl (ref 78.0–100.0)
Monocytes Absolute: 0.6 10*3/uL (ref 0.1–1.0)
Monocytes Relative: 7.6 % (ref 3.0–12.0)
Neutro Abs: 5 10*3/uL (ref 1.4–7.7)
Neutrophils Relative %: 68.6 % (ref 43.0–77.0)
PLATELETS: 117 10*3/uL — AB (ref 150.0–400.0)
RBC: 4.82 Mil/uL (ref 4.22–5.81)
RDW: 13.9 % (ref 11.5–14.6)
WBC: 7.3 10*3/uL (ref 4.5–10.5)

## 2013-09-26 LAB — URINALYSIS, ROUTINE W REFLEX MICROSCOPIC
Bilirubin Urine: NEGATIVE
HGB URINE DIPSTICK: NEGATIVE
Ketones, ur: NEGATIVE
Leukocytes, UA: NEGATIVE
NITRITE: NEGATIVE
PH: 7 (ref 5.0–8.0)
SPECIFIC GRAVITY, URINE: 1.02 (ref 1.000–1.030)
TOTAL PROTEIN, URINE-UPE24: 30 — AB
Urine Glucose: NEGATIVE
Urobilinogen, UA: 1 (ref 0.0–1.0)

## 2013-09-26 LAB — BASIC METABOLIC PANEL
BUN: 15 mg/dL (ref 6–23)
CALCIUM: 9.1 mg/dL (ref 8.4–10.5)
CO2: 29 mEq/L (ref 19–32)
Chloride: 104 mEq/L (ref 96–112)
Creatinine, Ser: 0.8 mg/dL (ref 0.4–1.5)
GFR: 110.99 mL/min (ref >60.00)
Glucose, Bld: 88 mg/dL (ref 70–99)
Potassium: 3.8 mEq/L (ref 3.5–5.1)
Sodium: 139 mEq/L (ref 135–145)
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
## 2013-09-26 NOTE — Patient Instructions (Addendum)
KEEP YOUR FOLLOW UP WITH SCOTT WEAVER, [place] ON 10/18/13 @ 8:30  YOU ARE SCHEDULED FOR LAB WORK THAT SAME DAY WHICH WE WILL GET AFTER YOU SEE SCOTT WEAVER, [facility]  Your physician recommends that you return for lab work TODAY, BMET, CBC W/DIFF, AND A U/A  NO CHANGES WERE MADE WITH YOUR MEDICATIONS TODAY

## 2013-09-30 ENCOUNTER — Telehealth: Payer: Self-pay | Admitting: *Deleted

## 2013-09-30 DIAGNOSIS — R3989 Other symptoms and signs involving the genitourinary system: Secondary | ICD-10-CM

## 2013-09-30 NOTE — Telephone Encounter (Signed)
Message copied by Verna Czech on Fri Sep 30, 2013 11:09 AM ------      Message from: [address], [place] T      Created: Thu Sep 29, 2013  5:39 PM       Creatinine ok      Hgb ok      Few RBCs noted in urine - ? Significance      Repeat U/A in one week      Call if color changes or he notes blood in urine      Richardson Dopp, PA-C        09/29/2013 5:39 PM ------

## 2013-09-30 NOTE — Telephone Encounter (Signed)
Pt is aware of lab results & will come in next Friday for repeat UA. Horton Chin RN

## 2013-10-03 NOTE — Telephone Encounter (Signed)
UA ORDERED

## 2013-10-03 NOTE — Addendum Note (Signed)
Addended by: Michae Kava on: 10/03/2013 03:33 PM   Modules accepted: Orders

## 2013-10-07 ENCOUNTER — Ambulatory Visit: Payer: Self-pay | Admitting: *Deleted

## 2013-10-07 DIAGNOSIS — Z79899 Other long term (current) drug therapy: Secondary | ICD-10-CM

## 2013-10-08 LAB — URINALYSIS
Bilirubin Urine: NEGATIVE
Glucose, UA: NEGATIVE mg/dL
HGB URINE DIPSTICK: NEGATIVE
KETONES UR: NEGATIVE mg/dL
Leukocytes, UA: NEGATIVE
Nitrite: NEGATIVE
PH: 5 (ref 5.0–8.0)
Protein, ur: NEGATIVE mg/dL
Specific Gravity, Urine: 1.019 (ref 1.005–1.030)
Urobilinogen, UA: 0.2 mg/dL (ref 0.0–1.0)

## 2013-10-11 LAB — AMIODARONE LEVEL
Amiodarone Lvl: 1.1 ug/mL — ABNORMAL LOW (ref 1.5–2.5)
Desethylamiodarone: 0.3 ug/mL — ABNORMAL LOW (ref 1.5–2.5)

## 2013-10-13 ENCOUNTER — Other Ambulatory Visit: Payer: Self-pay

## 2013-10-13 ENCOUNTER — Ambulatory Visit: Payer: Self-pay | Admitting: Physician Assistant

## 2013-10-14 ENCOUNTER — Other Ambulatory Visit: Payer: Self-pay

## 2013-10-14 ENCOUNTER — Ambulatory Visit: Payer: Self-pay | Admitting: Interventional Cardiology

## 2013-10-18 ENCOUNTER — Telehealth: Payer: Self-pay | Admitting: *Deleted

## 2013-10-18 ENCOUNTER — Other Ambulatory Visit: Payer: Self-pay | Admitting: *Deleted

## 2013-10-18 ENCOUNTER — Encounter: Payer: Self-pay | Admitting: Physician Assistant
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
## 2013-10-18 NOTE — Telephone Encounter (Signed)
lmptcb for lab results 

## 2013-10-19 NOTE — Telephone Encounter (Signed)
wife's cell that identified wife by name, no blood in UA, amiodarone level low, but was done too soon, however probably do not need to repeat at this time.

## 2013-10-20 NOTE — Progress Notes (Signed)
This encounter was created in error - please disregard.

## 2013-11-16 ENCOUNTER — Other Ambulatory Visit: Payer: Self-pay | Admitting: Internal Medicine

## 2013-11-17 ENCOUNTER — Other Ambulatory Visit: Payer: Self-pay | Admitting: Internal Medicine

## 2013-11-17 ENCOUNTER — Other Ambulatory Visit: Payer: Self-pay | Admitting: Emergency Medicine

## 2013-11-17 MED ORDER — ATORVASTATIN CALCIUM 40 MG PO TABS: 40.0000 mg | ORAL_TABLET | Freq: Every day | ORAL | Status: DC

## 2013-11-17 MED ORDER — LISINOPRIL 5 MG PO TABS: 5.0000 mg | ORAL_TABLET | Freq: Every day | ORAL | Status: DC

## 2013-11-18 ENCOUNTER — Other Ambulatory Visit: Payer: Self-pay | Admitting: *Deleted

## 2013-11-18 DIAGNOSIS — I4891 Unspecified atrial fibrillation: Secondary | ICD-10-CM

## 2013-11-18 MED ORDER — AMIODARONE HCL 200 MG PO TABS: 200.0000 mg | ORAL_TABLET | Freq: Two times a day (BID) | ORAL | Status: DC

## 2013-11-18 MED ORDER — FUROSEMIDE 40 MG PO TABS: 40.0000 mg | ORAL_TABLET | Freq: Two times a day (BID) | ORAL | Status: DC

## 2013-12-23 ENCOUNTER — Telehealth: Payer: Self-pay | Admitting: Physician Assistant

## 2013-12-23 NOTE — Telephone Encounter (Signed)
New message    Pt has been off of eliquis for 2wks because he did not have ins and he had already gotten 48mos of samples from [place] and he did not want to ask for any more samples.  He now has ins and before he picks up the presc for eliquis today, he want to know if it is ok to restart it after being off of it for 2wks?

## 2013-12-23 NOTE — Telephone Encounter (Signed)
Cb pt in reference to being off Eliquis x 2 weeks due to no insurance. I explained to the pt the high risk of having a stroke because he has been off the Eliquis. Pt then said he has had problems going on and that his wife just had a heart attack last Saturday. Pt said he wants to make sure it is ok to go back on the Eliquis and I said yes. Pt states he was supposed to pick med up today but he gets meds from [facility] and they were already closed for the weekend. I asked pt if I could send into [facility], he said no because he will not get he same price, and said that he will jujst pick it up on Monday 12/26/13. I again explained the risk for stroke not being on Eliquis right now and he said he is willing to take the risk, See earlier phone note from pt. Pt then states he has not heard anything from [place] about getting him set up for DCCV, said he just had EKG in our office in June. Said no, last appt 4/27. Pt was insistent that he was here and had EKG done in June around the 1st. I said the last EKG in the computer is from the 09/26/13 appt with Richardson Dopp, PA. I also said the last phone note I is from Misty Stanley RN about lab results and was supposed to come in 1 week later from a repeat UA. I went over all the cancelled appts since 09/26/13. Pt said all he keeps seeing is the PA and said he wants to see his dr but said Dr. Irish Lack keeps pushing him off to the PA. I explained to pt that he did have an appt with Dr. Irish Lack scheduled but cancelled it. Pt then said whatever and that he will pick up meds Monday.

## 2013-12-26 ENCOUNTER — Other Ambulatory Visit: Payer: Self-pay | Admitting: Internal Medicine

## 2013-12-26 NOTE — Telephone Encounter (Signed)
lmtrc

## 2013-12-26 NOTE — Telephone Encounter (Signed)
Resume Eliquis. Arrange follow up. DCCV cannot be done safely unless he has been on Eliquis for 3-4 weeks without interruption. Richardson Dopp, PA-C   12/26/2013 1:42 PM

## 2013-12-28 ENCOUNTER — Other Ambulatory Visit: Payer: Self-pay | Admitting: *Deleted

## 2013-12-28 DIAGNOSIS — I4891 Unspecified atrial fibrillation: Secondary | ICD-10-CM

## 2013-12-28 MED ORDER — AMIODARONE HCL 200 MG PO TABS: 200.0000 mg | ORAL_TABLET | Freq: Two times a day (BID) | ORAL | Status: DC

## 2013-12-29 ENCOUNTER — Other Ambulatory Visit: Payer: Self-pay | Admitting: Cardiology

## 2013-12-29 DIAGNOSIS — I4891 Unspecified atrial fibrillation: Secondary | ICD-10-CM

## 2013-12-29 MED ORDER — AMIODARONE HCL 200 MG PO TABS: 200.0000 mg | ORAL_TABLET | Freq: Two times a day (BID) | ORAL | Status: DC

## 2013-12-29 NOTE — Telephone Encounter (Signed)
Spoke with pt and he started back taking Eliquis on Monday. Pt ran out of amiodarone about 2 weeks ago. He needs a refill. Refilled medication and he will try and start back tomorrow. I will check with Dr. Irish Lack to see exactly when pt needs to be seen to set up DCCV.

## 2014-01-03 NOTE — Telephone Encounter (Signed)
Should we schedule for appt in 3 weeks or so?

## 2014-01-09 NOTE — Telephone Encounter (Signed)
Pt would like to see Dr. Irish Lack at next [place]. I am working on getting pt appt at the end of August/beginning of September.

## 2014-01-24 NOTE — Telephone Encounter (Addendum)
Offered pt for 02/02/14, but pt requests appt on a Friday afternoon. OV scheduled for 02/10/14 at 3:45pm. Pt states he is doing fine and taking eliquis everyday. He feels better, but he still has some fatigue. FYI to Dr. Irish Lack.

## 2014-01-26 ENCOUNTER — Other Ambulatory Visit: Payer: Self-pay | Admitting: Internal Medicine

## 2014-01-27 ENCOUNTER — Other Ambulatory Visit: Payer: Self-pay | Admitting: *Deleted

## 2014-01-27 ENCOUNTER — Other Ambulatory Visit: Payer: Self-pay | Admitting: Internal Medicine

## 2014-01-27 MED ORDER — FUROSEMIDE 40 MG PO TABS: ORAL_TABLET | ORAL | Status: DC

## 2014-02-02 ENCOUNTER — Ambulatory Visit: Payer: Self-pay | Admitting: Interventional Cardiology

## 2014-02-10 ENCOUNTER — Ambulatory Visit (INDEPENDENT_AMBULATORY_CARE_PROVIDER_SITE_OTHER): Payer: 59 | Admitting: Interventional Cardiology

## 2014-02-10 ENCOUNTER — Encounter: Payer: Self-pay | Admitting: Interventional Cardiology

## 2014-02-10 ENCOUNTER — Encounter: Payer: Self-pay | Admitting: Cardiology

## 2014-02-10 VITALS — BP 160/80 | HR 61 | Ht 71.0 in | Wt 223.0 lb

## 2014-02-10 DIAGNOSIS — I5022 Chronic systolic (congestive) heart failure: Secondary | ICD-10-CM

## 2014-02-10 DIAGNOSIS — I428 Other cardiomyopathies: Secondary | ICD-10-CM

## 2014-02-10 DIAGNOSIS — I4819 Other persistent atrial fibrillation: Secondary | ICD-10-CM

## 2014-02-10 DIAGNOSIS — I4891 Unspecified atrial fibrillation: Secondary | ICD-10-CM

## 2014-02-10 DIAGNOSIS — I10 Essential (primary) hypertension: Secondary | ICD-10-CM

## 2014-02-10 LAB — BASIC METABOLIC PANEL
BUN: 17 mg/dL (ref 6–23)
CHLORIDE: 105 meq/L (ref 96–112)
CO2: 25 meq/L (ref 19–32)
Calcium: 9 mg/dL (ref 8.4–10.5)
Creat: 0.84 mg/dL (ref 0.50–1.35)
GLUCOSE: 89 mg/dL (ref 70–99)
POTASSIUM: 3.5 meq/L (ref 3.5–5.3)
SODIUM: 139 meq/L (ref 135–145)

## 2014-02-10 LAB — CBC WITH DIFFERENTIAL/PLATELET
Basophils Absolute: 0 10*3/uL (ref 0.0–0.1)
Basophils Relative: 0 % (ref 0–1)
EOS ABS: 0.1 10*3/uL (ref 0.0–0.7)
EOS PCT: 2 % (ref 0–5)
HCT: 42.7 % (ref 39.0–52.0)
Hemoglobin: 15.5 g/dL (ref 13.0–17.0)
LYMPHS ABS: 1.5 10*3/uL (ref 0.7–4.0)
LYMPHS PCT: 21 % (ref 12–46)
MCH: 30.3 pg (ref 26.0–34.0)
MCHC: 36.3 g/dL — ABNORMAL HIGH (ref 30.0–36.0)
MCV: 83.4 fL (ref 78.0–100.0)
MONO ABS: 0.6 10*3/uL (ref 0.1–1.0)
Monocytes Relative: 8 % (ref 3–12)
Neutro Abs: 5 10*3/uL (ref 1.7–7.7)
Neutrophils Relative %: 69 % (ref 43–77)
Platelets: 136 10*3/uL — ABNORMAL LOW (ref 150–400)
RBC: 5.12 MIL/uL (ref 4.22–5.81)
RDW: 13.9 % (ref 11.5–15.5)
WBC: 7.3 10*3/uL (ref 4.0–10.5)

## 2014-02-10 LAB — PROTIME-INR
INR: 1.04 (ref <1.50)
Prothrombin Time: 13.6 seconds (ref 11.6–15.2)
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
## 2014-02-10 NOTE — Patient Instructions (Signed)
Your physician recommends that you return for lab work today for cbc, bmet, and pt/inr.  Your physician has recommended that you have a Cardioversion (DCCV). Electrical Cardioversion uses a jolt of electricity to your heart either through paddles or wired patches attached to your chest. This is a controlled, usually prescheduled, procedure. Defibrillation is done under light anesthesia in the hospital, and you usually go home the day of the procedure. This is done to get your heart back into a normal rhythm. You are not awake for the procedure. Please see the instruction sheet given to you today.  Your physician has requested that you have an echocardiogram ON 9/14 OR 9/15. Echocardiography is a painless test that uses sound waves to create images of your heart. It provides your doctor with information about the size and shape of your heart and how well your heart's chambers and valves are working. This procedure takes approximately one hour. There are no restrictions for this procedure.

## 2014-02-14 ENCOUNTER — Other Ambulatory Visit: Payer: Self-pay | Admitting: Interventional Cardiology

## 2014-02-14 ENCOUNTER — Other Ambulatory Visit (HOSPITAL_COMMUNITY): Payer: 59

## 2014-02-14 ENCOUNTER — Telehealth: Payer: Self-pay | Admitting: Interventional Cardiology

## 2014-02-14 DIAGNOSIS — I4819 Other persistent atrial fibrillation: Secondary | ICD-10-CM

## 2014-02-14 NOTE — Telephone Encounter (Signed)
New message          Pt is sick and need to r/s procedure at hospital for tomorrow

## 2014-02-14 NOTE — Telephone Encounter (Addendum)
Lmtrc, procedure cancelled for tomorrow. Pt will need to r/s echo and DCCV.

## 2014-02-15 ENCOUNTER — Ambulatory Visit (HOSPITAL_COMMUNITY): Admission: RE | Admit: 2014-02-15 | Payer: 59 | Source: Ambulatory Visit | Admitting: Interventional Cardiology

## 2014-02-15 ENCOUNTER — Encounter (HOSPITAL_COMMUNITY): Admission: RE | Payer: Self-pay | Source: Ambulatory Visit

## 2014-02-15 ENCOUNTER — Encounter (HOSPITAL_COMMUNITY): Payer: Self-pay | Admitting: Interventional Cardiology

## 2014-02-15 SURGERY — CARDIOVERSION
Anesthesia: Monitor Anesthesia Care

## 2014-02-22 NOTE — Telephone Encounter (Signed)
Spoke with pt and he is still not feeling well, he thinks he may have had the flu. Pt would like DCCV and Echo scheduled for 2 weeks from now.

## 2014-03-01 NOTE — Telephone Encounter (Signed)
lmtrc

## 2014-03-01 NOTE — Telephone Encounter (Signed)
Msg sent to pt [facility] to r/s echo and DCCV scheduled for 03/09/14 at 9 am with arrival at 7:30 am.

## 2014-03-03 NOTE — Telephone Encounter (Signed)
lmtrc

## 2014-03-06 NOTE — Telephone Encounter (Signed)
lmtrc

## 2014-03-07 NOTE — Telephone Encounter (Signed)
Lmtrc, cancelled DCCV at this time

## 2014-03-09 ENCOUNTER — Ambulatory Visit (HOSPITAL_COMMUNITY): Admission: RE | Admit: 2014-03-09 | Payer: 59 | Source: Ambulatory Visit | Admitting: Cardiology

## 2014-03-09 ENCOUNTER — Other Ambulatory Visit: Payer: Self-pay | Admitting: Interventional Cardiology

## 2014-03-09 ENCOUNTER — Encounter (HOSPITAL_COMMUNITY): Admission: RE | Payer: Self-pay | Source: Ambulatory Visit

## 2014-03-09 ENCOUNTER — Other Ambulatory Visit: Payer: Self-pay | Admitting: Internal Medicine

## 2014-03-09 SURGERY — CARDIOVERSION
Anesthesia: Monitor Anesthesia Care

## 2014-03-14 ENCOUNTER — Other Ambulatory Visit: Payer: Self-pay

## 2014-04-26 ENCOUNTER — Other Ambulatory Visit: Payer: Self-pay | Admitting: Interventional Cardiology

## 2014-04-26 ENCOUNTER — Other Ambulatory Visit: Payer: Self-pay | Admitting: Internal Medicine

## 2014-05-11 ENCOUNTER — Encounter (HOSPITAL_COMMUNITY): Payer: Self-pay | Admitting: Interventional Cardiology

## 2014-05-16 ENCOUNTER — Telehealth: Payer: Self-pay | Admitting: Internal Medicine

## 2014-05-16 NOTE — Telephone Encounter (Signed)
Pt will need to see provider before med refill

## 2014-05-16 NOTE — Telephone Encounter (Signed)
Pt will schedule office visit appt after Christmas but says he needs refill for "water pill" for his heart. Pt was advised that because he has not come in for a follow up appt since January that there might be an issue so states he will be making appt soon. Please f/u with pt.

## 2014-06-20 ENCOUNTER — Telehealth: Payer: Self-pay | Admitting: *Deleted

## 2014-06-20 MED ORDER — AMIODARONE HCL 200 MG PO TABS: ORAL_TABLET | ORAL | Status: DC

## 2014-06-20 MED ORDER — ATORVASTATIN CALCIUM 40 MG PO TABS: 40.0000 mg | ORAL_TABLET | Freq: Every day | ORAL | Status: DC

## 2014-06-20 MED ORDER — APIXABAN 5 MG PO TABS: 5.0000 mg | ORAL_TABLET | Freq: Two times a day (BID) | ORAL | Status: DC

## 2014-06-20 MED ORDER — METOPROLOL TARTRATE 50 MG PO TABS: 50.0000 mg | ORAL_TABLET | Freq: Two times a day (BID) | ORAL | Status: DC

## 2014-06-20 MED ORDER — FUROSEMIDE 40 MG PO TABS: ORAL_TABLET | ORAL | Status: DC

## 2014-06-20 MED ORDER — LISINOPRIL 5 MG PO TABS: 5.0000 mg | ORAL_TABLET | Freq: Every day | ORAL | Status: DC

## 2014-06-20 NOTE — Telephone Encounter (Signed)
Patient called and stated that he has been off of all his medications including eliquis for six weeks. He is also experiencing stomach bloating that he feels is secondary to being off of the furosemide and he is having shortness of breath upon exertion. He can be reached at [phone]. Please advise. Thanks, MI

## 2014-06-20 NOTE — Telephone Encounter (Signed)
Spoke with pt who unexpectedly had to go out of town for 6 weeks and ran out of medications while gone.  He reports shortness of breath with exertion.  Is only available for appts on Friday.  Appt made for pt to see Dr. Irish Lack on June 23, 2014 at 3:45.  Will send one month supply of amiodarone, eliquis, lipitor, lasix, lisinopril and metoprolol to [place].  Pt aware he will need to keep office visit on June 23, 2014 for further refills.

## 2014-06-23 ENCOUNTER — Ambulatory Visit: Payer: Self-pay | Admitting: Interventional Cardiology

## 2014-08-04 ENCOUNTER — Encounter: Payer: Self-pay | Admitting: Interventional Cardiology

## 2014-08-04 ENCOUNTER — Ambulatory Visit (INDEPENDENT_AMBULATORY_CARE_PROVIDER_SITE_OTHER): Payer: 59 | Admitting: Interventional Cardiology

## 2014-08-04 VITALS — BP 138/72 | HR 90 | Ht 71.0 in | Wt 226.0 lb

## 2014-08-04 DIAGNOSIS — I519 Heart disease, unspecified: Secondary | ICD-10-CM

## 2014-08-04 DIAGNOSIS — I428 Other cardiomyopathies: Secondary | ICD-10-CM

## 2014-08-04 DIAGNOSIS — I482 Chronic atrial fibrillation, unspecified: Secondary | ICD-10-CM

## 2014-08-04 DIAGNOSIS — I5022 Chronic systolic (congestive) heart failure: Secondary | ICD-10-CM

## 2014-08-04 DIAGNOSIS — I429 Cardiomyopathy, unspecified: Secondary | ICD-10-CM

## 2014-08-04 LAB — COMPREHENSIVE METABOLIC PANEL
ALT: 28 U/L (ref 0–53)
AST: 24 U/L (ref 0–37)
Albumin: 4.1 g/dL (ref 3.5–5.2)
Alkaline Phosphatase: 86 U/L (ref 39–117)
BUN: 12 mg/dL (ref 6–23)
CALCIUM: 9.2 mg/dL (ref 8.4–10.5)
CO2: 28 mEq/L (ref 19–32)
Chloride: 108 mEq/L (ref 96–112)
Creatinine, Ser: 0.81 mg/dL (ref 0.40–1.50)
GFR: 102.83 mL/min (ref >60.00)
GLUCOSE: 106 mg/dL — AB (ref 70–99)
POTASSIUM: 3.7 meq/L (ref 3.5–5.1)
Sodium: 141 mEq/L (ref 135–145)
TOTAL PROTEIN: 7 g/dL (ref 6.0–8.3)
Total Bilirubin: 0.5 mg/dL (ref 0.2–1.2)

## 2014-08-04 LAB — CBC WITH DIFFERENTIAL/PLATELET
BASOS PCT: 0.5 % (ref 0.0–3.0)
Basophils Absolute: 0 10*3/uL (ref 0.0–0.1)
EOS ABS: 0.1 10*3/uL (ref 0.0–0.7)
Eosinophils Relative: 1.8 % (ref 0.0–5.0)
HEMATOCRIT: 45 % (ref 39.0–52.0)
Hemoglobin: 15.4 g/dL (ref 13.0–17.0)
LYMPHS ABS: 1.5 10*3/uL (ref 0.7–4.0)
Lymphocytes Relative: 19.6 % (ref 12.0–46.0)
MCHC: 34.3 g/dL (ref 30.0–36.0)
MCV: 86.6 fl (ref 78.0–100.0)
Monocytes Absolute: 0.6 10*3/uL (ref 0.1–1.0)
Monocytes Relative: 8.5 % (ref 3.0–12.0)
NEUTROS ABS: 5.2 10*3/uL (ref 1.4–7.7)
Neutrophils Relative %: 69.6 % (ref 43.0–77.0)
PLATELETS: 124 10*3/uL — AB (ref 150.0–400.0)
RBC: 5.19 Mil/uL (ref 4.22–5.81)
RDW: 13.4 % (ref 11.5–15.5)
WBC: 7.4 10*3/uL (ref 4.0–10.5)

## 2014-08-04 LAB — TSH: TSH: 3.83 u[IU]/mL (ref 0.35–4.50)

## 2014-08-04 MED ORDER — LISINOPRIL 5 MG PO TABS: 5.0000 mg | ORAL_TABLET | Freq: Every day | ORAL | Status: DC

## 2014-08-04 MED ORDER — METOPROLOL TARTRATE 50 MG PO TABS: 50.0000 mg | ORAL_TABLET | Freq: Two times a day (BID) | ORAL | Status: DC

## 2014-08-04 MED ORDER — FUROSEMIDE 40 MG PO TABS: ORAL_TABLET | ORAL | Status: DC

## 2014-08-04 MED ORDER — APIXABAN 5 MG PO TABS: 5.0000 mg | ORAL_TABLET | Freq: Two times a day (BID) | ORAL | Status: DC

## 2014-08-04 MED ORDER — ATORVASTATIN CALCIUM 40 MG PO TABS: 40.0000 mg | ORAL_TABLET | Freq: Every day | ORAL | Status: DC

## 2014-08-04 MED ORDER — AMIODARONE HCL 200 MG PO TABS: ORAL_TABLET | ORAL | Status: DC
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
## 2014-08-04 NOTE — Patient Instructions (Signed)
Your physician recommends that you have lab work today: CMET/ TSH/ CBC  Your physician has requested that you have an echocardiogram. Echocardiography is a painless test that uses sound waves to create images of your heart. It provides your doctor with information about the size and shape of your heart and how well your heart's chambers and valves are working. This procedure takes approximately one hour. There are no restrictions for this procedure.  Your physician recommends that you schedule a follow-up appointment in: 4 weeks with Dr. Irish Lack.

## 2014-08-11 ENCOUNTER — Other Ambulatory Visit (HOSPITAL_COMMUNITY): Payer: 59

## 2014-08-14 ENCOUNTER — Encounter (HOSPITAL_COMMUNITY): Payer: Self-pay | Admitting: Interventional Cardiology

## 2014-08-16 ENCOUNTER — Encounter: Payer: Self-pay | Admitting: *Deleted

## 2014-08-16 ENCOUNTER — Telehealth: Payer: Self-pay | Admitting: Interventional Cardiology

## 2014-08-16 NOTE — Telephone Encounter (Signed)
Patient no showed for his echo on 3/11. I called to follow up with him. He states he has had the flu and is still dealing with this. He will call back to reschedule his echo.

## 2014-09-07 ENCOUNTER — Ambulatory Visit: Payer: 59 | Admitting: Interventional Cardiology

## 2015-05-11 ENCOUNTER — Other Ambulatory Visit: Payer: Self-pay | Admitting: Interventional Cardiology

## 2015-05-11 ENCOUNTER — Other Ambulatory Visit: Payer: Self-pay | Admitting: *Deleted

## 2015-05-12 ENCOUNTER — Emergency Department (HOSPITAL_COMMUNITY): Payer: 59

## 2015-05-12 ENCOUNTER — Encounter (HOSPITAL_COMMUNITY): Payer: Self-pay

## 2015-05-12 ENCOUNTER — Emergency Department (HOSPITAL_COMMUNITY)
Admission: EM | Admit: 2015-05-12 | Discharge: 2015-05-12 | Disposition: A | Discharge status: Home / Self Care | Payer: 59 | Attending: Physician Assistant | Admitting: Physician Assistant

## 2015-05-12 DIAGNOSIS — Z79899 Other long term (current) drug therapy: Secondary | ICD-10-CM | POA: Diagnosis not present

## 2015-05-12 DIAGNOSIS — Z87891 Personal history of nicotine dependence: Secondary | ICD-10-CM | POA: Insufficient documentation

## 2015-05-12 DIAGNOSIS — Z8719 Personal history of other diseases of the digestive system: Secondary | ICD-10-CM | POA: Insufficient documentation

## 2015-05-12 DIAGNOSIS — L03113 Cellulitis of right upper limb: Secondary | ICD-10-CM | POA: Diagnosis not present

## 2015-05-12 DIAGNOSIS — Z8739 Personal history of other diseases of the musculoskeletal system and connective tissue: Secondary | ICD-10-CM | POA: Diagnosis not present

## 2015-05-12 DIAGNOSIS — I10 Essential (primary) hypertension: Secondary | ICD-10-CM | POA: Insufficient documentation

## 2015-05-12 DIAGNOSIS — Z792 Long term (current) use of antibiotics: Secondary | ICD-10-CM | POA: Insufficient documentation

## 2015-05-12 DIAGNOSIS — Z7902 Long term (current) use of antithrombotics/antiplatelets: Secondary | ICD-10-CM | POA: Diagnosis not present

## 2015-05-12 DIAGNOSIS — G4733 Obstructive sleep apnea (adult) (pediatric): Secondary | ICD-10-CM | POA: Diagnosis not present

## 2015-05-12 DIAGNOSIS — M7989 Other specified soft tissue disorders: Secondary | ICD-10-CM | POA: Diagnosis present

## 2015-05-12 DIAGNOSIS — I509 Heart failure, unspecified: Secondary | ICD-10-CM | POA: Insufficient documentation

## 2015-05-12 DIAGNOSIS — Z9889 Other specified postprocedural states: Secondary | ICD-10-CM | POA: Insufficient documentation

## 2015-05-12 DIAGNOSIS — Z1810 Retained metal fragments, unspecified: Secondary | ICD-10-CM | POA: Insufficient documentation

## 2015-05-12 DIAGNOSIS — Z9981 Dependence on supplemental oxygen: Secondary | ICD-10-CM | POA: Diagnosis not present

## 2015-05-12 LAB — CBC WITH DIFFERENTIAL/PLATELET
Basophils Absolute: 0 10*3/uL (ref 0.0–0.1)
Basophils Relative: 0 %
Eosinophils Absolute: 0.1 10*3/uL (ref 0.0–0.7)
Eosinophils Relative: 0 %
HCT: 44.3 % (ref 39.0–52.0)
HEMOGLOBIN: 15.2 g/dL (ref 13.0–17.0)
LYMPHS ABS: 1.3 10*3/uL (ref 0.7–4.0)
LYMPHS PCT: 9 %
MCH: 30.6 pg (ref 26.0–34.0)
MCHC: 34.3 g/dL (ref 30.0–36.0)
MCV: 89.1 fL (ref 78.0–100.0)
Monocytes Absolute: 1 10*3/uL (ref 0.1–1.0)
Monocytes Relative: 7 %
NEUTROS PCT: 84 %
Neutro Abs: 12.8 10*3/uL — ABNORMAL HIGH (ref 1.7–7.7)
Platelets: 129 10*3/uL — ABNORMAL LOW (ref 150–400)
RBC: 4.97 MIL/uL (ref 4.22–5.81)
RDW: 13.2 % (ref 11.5–15.5)
WBC: 15.2 10*3/uL — AB (ref 4.0–10.5)

## 2015-05-12 LAB — COMPREHENSIVE METABOLIC PANEL
ALK PHOS: 89 U/L (ref 38–126)
ALT: 23 U/L (ref 17–63)
AST: 20 U/L (ref 15–41)
Albumin: 3.5 g/dL (ref 3.5–5.0)
Anion gap: 9 (ref 5–15)
BUN: 8 mg/dL (ref 6–20)
CALCIUM: 9 mg/dL (ref 8.9–10.3)
CO2: 27 mmol/L (ref 22–32)
CREATININE: 0.87 mg/dL (ref 0.61–1.24)
Chloride: 102 mmol/L (ref 101–111)
Glucose, Bld: 99 mg/dL (ref 65–99)
Potassium: 3.4 mmol/L — ABNORMAL LOW (ref 3.5–5.1)
Sodium: 138 mmol/L (ref 135–145)
Total Bilirubin: 1.2 mg/dL (ref 0.3–1.2)
Total Protein: 7.1 g/dL (ref 6.5–8.1)

## 2015-05-12 MED ORDER — ONDANSETRON HCL 4 MG PO TABS
4.0000 mg | ORAL_TABLET | Freq: Once | ORAL | Status: AC
  Administered 2015-05-12: 4 mg via ORAL
  Filled 2015-05-12: qty 1
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
## 2015-05-12 NOTE — ED Notes (Signed)
MD marked patients skin with marker for patient to monitor for any changes.

## 2015-05-12 NOTE — Discharge Instructions (Signed)
Be sure to take your antibitoics.  If the redness goes beyond the drawn area, return to the ED for IV antibitoics.  If you have a fever or are unable to take your medications due to vomiting please return immediately.    Cellulitis Cellulitis is an infection of the skin and the tissue beneath it. The infected area is usually red and tender. Cellulitis occurs most often in the arms and lower legs.  CAUSES  Cellulitis is caused by bacteria that enter the skin through cracks or cuts in the skin. The most common types of bacteria that cause cellulitis are staphylococci and streptococci. SIGNS AND SYMPTOMS   Redness and warmth.  Swelling.  Tenderness or pain.  Fever. DIAGNOSIS  Your health care provider can usually determine what is wrong based on a physical exam. Blood tests may also be done. TREATMENT  Treatment usually involves taking an antibiotic medicine. HOME CARE INSTRUCTIONS   Take your antibiotic medicine as directed by your health care provider. Finish the antibiotic even if you start to feel better.  Keep the infected arm or leg elevated to reduce swelling.  Apply a warm cloth to the affected area up to 4 times per day to relieve pain.  Take medicines only as directed by your health care provider.  Keep all follow-up visits as directed by your health care provider. SEEK MEDICAL CARE IF:   You notice red streaks coming from the infected area.  Your red area gets larger or turns dark in color.  Your bone or joint underneath the infected area becomes painful after the skin has healed.  Your infection returns in the same area or another area.  You notice a swollen bump in the infected area.  You develop new symptoms.  You have a fever. SEEK IMMEDIATE MEDICAL CARE IF:   You feel very sleepy.  You develop vomiting or diarrhea.  You have a general ill feeling (malaise) with muscle aches and pains.   This information is not intended to replace advice given to you  by your health care provider. Make sure you discuss any questions you have with your health care provider.   Document Released: 02/26/2005 Document Revised: 02/07/2015 Document Reviewed: 08/04/2011 Elsevier Interactive Patient Education [employer].

## 2015-05-12 NOTE — ED Notes (Signed)
Pt given [place] sandwich, coke.

## 2015-05-12 NOTE — ED Notes (Signed)
Pt seen at u/c yesterday for redness on right arm.  Pt received antibiotic injection office and started on Doxycycline.  Pt reports redness has spread.

## 2015-05-14 ENCOUNTER — Telehealth: Payer: Self-pay | Admitting: Interventional Cardiology

## 2015-05-14 NOTE — Telephone Encounter (Signed)
Patient would like Dr. Irish Lack to know:  FYI Calling to let Dr Irish Lack know that he is in [place] working on a big job and will return home after the first of the year. When he returns, he will call to schedule an appt for part 2 of the cardioversion procedure.

## 2015-05-14 NOTE — Telephone Encounter (Signed)
New Message  Pt requested to speak w/ Refill on changing some of his Rx to new pharmacies. Some will remain the same- pt did not specify which Rx. Please call back and discuss.

## 2015-05-14 NOTE — Telephone Encounter (Signed)
New message     FYI Calling to let Dr Irish Lack know that he is in [place] working on a big job and will return home after the first of the year.  When he returns, he will call to schedule an appt for part 2 of the cardioversion procedure.

## 2015-05-15 ENCOUNTER — Other Ambulatory Visit: Payer: Self-pay | Admitting: *Deleted

## 2015-06-29 MED FILL — ELIQUIS 5 MG TABLET: 5 | 30 days supply | Qty: 60 | Fill #1 | Status: TO

## 2015-08-23 MED FILL — ELIQUIS 5 MG TABLET: 5 | 30 days supply | Qty: 60 | Fill #2 | Status: TO

## 2015-08-29 ENCOUNTER — Ambulatory Visit: Payer: 59 | Attending: Internal Medicine | Admitting: Internal Medicine

## 2015-08-29 ENCOUNTER — Encounter: Payer: Self-pay | Admitting: Internal Medicine

## 2015-08-29 ENCOUNTER — Encounter: Payer: Self-pay | Admitting: Clinical

## 2015-08-29 VITALS — BP 153/83 | HR 73 | Temp 97.8°F | Resp 16 | Ht 71.0 in | Wt 225.0 lb

## 2015-08-29 DIAGNOSIS — I10 Essential (primary) hypertension: Secondary | ICD-10-CM | POA: Diagnosis not present

## 2015-08-29 DIAGNOSIS — Z809 Family history of malignant neoplasm, unspecified: Secondary | ICD-10-CM | POA: Insufficient documentation

## 2015-08-29 DIAGNOSIS — Z7901 Long term (current) use of anticoagulants: Secondary | ICD-10-CM | POA: Diagnosis not present

## 2015-08-29 DIAGNOSIS — E785 Hyperlipidemia, unspecified: Secondary | ICD-10-CM | POA: Diagnosis not present

## 2015-08-29 DIAGNOSIS — L03114 Cellulitis of left upper limb: Secondary | ICD-10-CM | POA: Insufficient documentation

## 2015-08-29 DIAGNOSIS — Z87891 Personal history of nicotine dependence: Secondary | ICD-10-CM | POA: Insufficient documentation

## 2015-08-29 DIAGNOSIS — Z1211 Encounter for screening for malignant neoplasm of colon: Secondary | ICD-10-CM | POA: Diagnosis not present

## 2015-08-29 DIAGNOSIS — Z1810 Retained metal fragments, unspecified: Secondary | ICD-10-CM | POA: Insufficient documentation

## 2015-08-29 DIAGNOSIS — Z0000 Encounter for general adult medical examination without abnormal findings: Secondary | ICD-10-CM | POA: Diagnosis present

## 2015-08-29 DIAGNOSIS — I4891 Unspecified atrial fibrillation: Secondary | ICD-10-CM | POA: Insufficient documentation

## 2015-08-29 DIAGNOSIS — M109 Gout, unspecified: Secondary | ICD-10-CM | POA: Insufficient documentation

## 2015-08-29 DIAGNOSIS — I428 Other cardiomyopathies: Secondary | ICD-10-CM

## 2015-08-29 DIAGNOSIS — G4733 Obstructive sleep apnea (adult) (pediatric): Secondary | ICD-10-CM | POA: Diagnosis not present

## 2015-08-29 DIAGNOSIS — Z79899 Other long term (current) drug therapy: Secondary | ICD-10-CM | POA: Diagnosis not present

## 2015-08-29 DIAGNOSIS — Z125 Encounter for screening for malignant neoplasm of prostate: Secondary | ICD-10-CM

## 2015-08-29 DIAGNOSIS — I5022 Chronic systolic (congestive) heart failure: Secondary | ICD-10-CM | POA: Insufficient documentation

## 2015-08-29 DIAGNOSIS — G4730 Sleep apnea, unspecified: Secondary | ICD-10-CM | POA: Diagnosis not present

## 2015-08-29 DIAGNOSIS — Z833 Family history of diabetes mellitus: Secondary | ICD-10-CM | POA: Insufficient documentation

## 2015-08-29 DIAGNOSIS — M1990 Unspecified osteoarthritis, unspecified site: Secondary | ICD-10-CM | POA: Insufficient documentation

## 2015-08-29 DIAGNOSIS — I429 Cardiomyopathy, unspecified: Secondary | ICD-10-CM | POA: Diagnosis not present

## 2015-08-29 DIAGNOSIS — Z8719 Personal history of other diseases of the digestive system: Secondary | ICD-10-CM | POA: Diagnosis not present

## 2015-08-29 LAB — HEPATITIS C ANTIBODY: HCV Ab: NEGATIVE

## 2015-08-29 LAB — CBC WITH DIFFERENTIAL/PLATELET
BASOS ABS: 0 10*3/uL (ref 0.0–0.1)
BASOS PCT: 0 % (ref 0–1)
Eosinophils Absolute: 0.1 10*3/uL (ref 0.0–0.7)
Eosinophils Relative: 2 % (ref 0–5)
HCT: 43.8 % (ref 39.0–52.0)
HEMOGLOBIN: 15.8 g/dL (ref 13.0–17.0)
Lymphocytes Relative: 19 % (ref 12–46)
Lymphs Abs: 1.3 10*3/uL (ref 0.7–4.0)
MCH: 31.3 pg (ref 26.0–34.0)
MCHC: 36.1 g/dL — AB (ref 30.0–36.0)
MCV: 86.9 fL (ref 78.0–100.0)
MONO ABS: 0.6 10*3/uL (ref 0.1–1.0)
MPV: 12.9 fL — AB (ref 8.6–12.4)
Monocytes Relative: 9 % (ref 3–12)
NEUTROS ABS: 5 10*3/uL (ref 1.7–7.7)
NEUTROS PCT: 70 % (ref 43–77)
Platelets: 137 10*3/uL — ABNORMAL LOW (ref 150–400)
RBC: 5.04 MIL/uL (ref 4.22–5.81)
RDW: 13.9 % (ref 11.5–15.5)
WBC: 7.1 10*3/uL (ref 4.0–10.5)

## 2015-08-29 LAB — TSH: TSH: 5.25 m[IU]/L — AB (ref 0.40–4.50)

## 2015-08-29 LAB — BASIC METABOLIC PANEL
BUN: 15 mg/dL (ref 7–25)
CO2: 24 mmol/L (ref 20–31)
Calcium: 9.2 mg/dL (ref 8.6–10.3)
Chloride: 105 mmol/L (ref 98–110)
Creat: 0.64 mg/dL — ABNORMAL LOW (ref 0.70–1.25)
GLUCOSE: 97 mg/dL (ref 65–99)
POTASSIUM: 4.2 mmol/L (ref 3.5–5.3)
SODIUM: 140 mmol/L (ref 135–146)

## 2015-08-29 LAB — HEMOCCULT GUIAC POC 1CARD (OFFICE): FECAL OCCULT BLD: NEGATIVE

## 2015-08-29 LAB — LIPID PANEL
Cholesterol: 183 mg/dL (ref 125–200)
HDL: 31 mg/dL — ABNORMAL LOW (ref >=40)
LDL CALC: 74 mg/dL (ref <130)
Total CHOL/HDL Ratio: 5.9 Ratio — ABNORMAL HIGH (ref <=5.0)
Triglycerides: 391 mg/dL — ABNORMAL HIGH (ref <150)
VLDL: 78 mg/dL — AB (ref <30)

## 2015-08-29 MED ORDER — ZOSTER VACCINE LIVE 19400 UNT/0.65ML ~~LOC~~ SOLR: 0.6500 mL | Freq: Once | SUBCUTANEOUS | Status: DC

## 2015-08-29 MED ORDER — METOPROLOL TARTRATE 50 MG PO TABS: ORAL_TABLET | ORAL | Status: DC

## 2015-08-29 NOTE — Progress Notes (Signed)
Establish Care Annual physical. Taking medialization as prescribed  No pain today  No tobacco user  No suicidal thoughts in the past two weeks

## 2015-08-29 NOTE — Patient Instructions (Addendum)
It was a pleasure meeting you.  DASH Eating Plan DASH stands for "Dietary Approaches to Stop Hypertension." The DASH eating plan is a healthy eating plan that has been shown to reduce high blood pressure (hypertension). Additional health benefits may include reducing the risk of type 2 diabetes mellitus, heart disease, and stroke. The DASH eating plan may also help with weight loss. WHAT DO I NEED TO KNOW ABOUT THE DASH EATING PLAN? For the DASH eating plan, you will follow these general guidelines:  Choose foods with a percent daily value for sodium of less than 5% (as listed on the food label).  Use salt-free seasonings or herbs instead of table salt or sea salt.  Check with your health care provider or pharmacist before using salt substitutes.  Eat lower-sodium products, often labeled as "lower sodium" or "no salt added."  Eat fresh foods.  Eat more vegetables, fruits, and low-fat dairy products.  Choose whole grains. Look for the word "whole" as the first word in the ingredient list.  Choose fish and skinless chicken or [place] more often than red meat. Limit fish, poultry, and meat to 6 oz (170 g) each day.  Limit sweets, desserts, sugars, and sugary drinks.  Choose heart-healthy fats.  Limit cheese to 1 oz (28 g) per day.  Eat more home-cooked food and less restaurant, buffet, and fast food.  Limit fried foods.  Cook foods using methods other than frying.  Limit canned vegetables. If you do use them, rinse them well to decrease the sodium.  When eating at a restaurant, ask that your food be prepared with less salt, or no salt if possible. WHAT FOODS CAN I EAT? Seek help from a dietitian for individual calorie needs. Grains Whole grain or whole wheat bread. Brown rice. Whole grain or whole wheat pasta. Quinoa, bulgur, and whole grain cereals. Low-sodium cereals. Corn or whole wheat flour tortillas. Whole grain cornbread. Whole grain crackers. Low-sodium  crackers. Vegetables Fresh or frozen vegetables (raw, steamed, roasted, or grilled). Low-sodium or reduced-sodium tomato and vegetable juices. Low-sodium or reduced-sodium tomato sauce and paste. Low-sodium or reduced-sodium canned vegetables.  Fruits All fresh, canned (in natural juice), or frozen fruits. Meat and Other Protein Products Ground beef (85% or leaner), grass-fed beef, or beef trimmed of fat. Skinless chicken or [place]. Ground chicken or [place]. Pork trimmed of fat. All fish and seafood. Eggs. Dried beans, peas, or lentils. Unsalted nuts and seeds. Unsalted canned beans. Dairy Low-fat dairy products, such as skim or 1% milk, 2% or reduced-fat cheeses, low-fat ricotta or cottage cheese, or plain low-fat yogurt. Low-sodium or reduced-sodium cheeses. Fats and Oils Tub margarines without trans fats. Light or reduced-fat mayonnaise and salad dressings (reduced sodium). Avocado. Safflower, olive, or canola oils. Natural peanut or almond butter. Other Unsalted popcorn and pretzels. The items listed above may not be a complete list of recommended foods or beverages. Contact your dietitian for more options. WHAT FOODS ARE NOT RECOMMENDED? Grains White bread. White pasta. White rice. Refined cornbread. Bagels and croissants. Crackers that contain trans fat. Vegetables Creamed or fried vegetables. Vegetables in a cheese sauce. Regular canned vegetables. Regular canned tomato sauce and paste. Regular tomato and vegetable juices. Fruits Dried fruits. Canned fruit in light or heavy syrup. Fruit juice. Meat and Other Protein Products Fatty cuts of meat. Ribs, chicken wings, bacon, sausage, bologna, salami, chitterlings, fatback, hot dogs, bratwurst, and packaged luncheon meats. Salted nuts and seeds. Canned beans with salt. Dairy Whole or 2% milk, cream, half-and-half, and  cream cheese. Whole-fat or sweetened yogurt. Full-fat cheeses or blue cheese. Nondairy creamers and whipped toppings.  Processed cheese, cheese spreads, or cheese curds. Condiments Onion and garlic salt, seasoned salt, table salt, and sea salt. Canned and packaged gravies. Worcestershire sauce. Tartar sauce. Barbecue sauce. Teriyaki sauce. Soy sauce, including reduced sodium. Steak sauce. Fish sauce. Oyster sauce. Cocktail sauce. Horseradish. Ketchup and mustard. Meat flavorings and tenderizers. Bouillon cubes. Hot sauce. Tabasco sauce. Marinades. Taco seasonings. Relishes. Fats and Oils Butter, stick margarine, lard, shortening, ghee, and bacon fat. Coconut, palm kernel, or palm oils. Regular salad dressings. Other Pickles and olives. Salted popcorn and pretzels. The items listed above may not be a complete list of foods and beverages to avoid. Contact your dietitian for more information. WHERE CAN I FIND MORE INFORMATION? National Heart, Lung, and Blood Institute: travelstabloid.com   This information is not intended to replace advice given to you by your health care provider. Make sure you discuss any questions you have with your health care provider.   Document Released: 05/08/2011 Document Revised: 06/09/2014 Document Reviewed: 03/23/2013 Elsevier Interactive Patient Education [employer].

## 2015-08-29 NOTE — Progress Notes (Signed)
Depression screen [facility] 2/9 08/29/2015 06/29/2013  Decreased Interest 0 0  Down, Depressed, Hopeless 0 0  PHQ - 2 Score 0 0  Altered sleeping 1 -  Tired, decreased energy 1 -  Change in appetite 0 -  Feeling bad or failure about yourself  0 -  Trouble concentrating 0 -  Moving slowly or fidgety/restless 0 -  Suicidal thoughts 0 -  PHQ-9 Score 2 -    GAD 7 : Generalized Anxiety Score 08/29/2015  Nervous, Anxious, on Edge 0  Control/stop worrying 0  Worry too much - different things 0  Trouble relaxing 1  Restless 0  Easily annoyed or irritable 0  Afraid - awful might happen 0  Total GAD 7 Score 1
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
## 2015-08-30 ENCOUNTER — Telehealth: Payer: Self-pay | Admitting: *Deleted

## 2015-08-30 ENCOUNTER — Other Ambulatory Visit: Payer: Self-pay | Admitting: Internal Medicine

## 2015-08-30 LAB — VITAMIN D 25 HYDROXY (VIT D DEFICIENCY, FRACTURES): VIT D 25 HYDROXY: 18 ng/mL — AB (ref 30–100)

## 2015-08-30 LAB — HIV ANTIBODY (ROUTINE TESTING W REFLEX): HIV 1&2 Ab, 4th Generation: NONREACTIVE

## 2015-08-30 LAB — PSA, MEDICARE: PSA: 1.45 ng/mL (ref <=4.00)

## 2015-08-30 MED ORDER — VITAMIN D (ERGOCALCIFEROL) 1.25 MG (50000 UNIT) PO CAPS: 50000.0000 [IU] | ORAL_CAPSULE | ORAL | Status: DC

## 2015-08-30 MED ORDER — LEVOTHYROXINE SODIUM 25 MCG PO TABS: 25.0000 ug | ORAL_TABLET | Freq: Every day | ORAL | Status: DC

## 2015-08-30 MED ORDER — FENOFIBRATE 145 MG PO TABS: 145.0000 mg | ORAL_TABLET | Freq: Every day | ORAL | Status: DC

## 2015-08-30 NOTE — Telephone Encounter (Signed)
LVM to return call.

## 2015-08-30 NOTE — Telephone Encounter (Signed)
-----   Message from [name], MD sent at 08/30/2015  8:59 AM EDT ----- Please call patient w/ lab results.  He is Vit D deficient, which can cause bone pains/muscles aches.  I have put in a prescription for Vit D for him.  Also, his TSH enzyme is elavated, meaning that he has mild hypothyroidism (can lead to fatigue, cold intolerance, constipation and host of other sideffects).  I have put in a small dose prescription for thyroid supplements as well.  His cholesterol levels were mixed, his ldl is 74 was good (goal <100); cholesterol 183 (goal <200); HDL was low 31 though (only way to increase this is through diet and increase exercise, a genetic component as well).  His Triglycerides remain high 391 (goal <150), despite being on Atorvastatin, so I added Tricor 1tab daily to help with that as a prescription as well.  We will chk labs again in 3-60months to make sure they are corrected.  The rest of his labs look good, neg Hep C, neg HIV, prostate (psa lab) was normal as well!  Blood count normal, no signs of inflammation/infection/anemia. thanks

## 2015-08-31 NOTE — Telephone Encounter (Signed)
Patient called back, patient stated that he would call back because he is not allowed to have phone calls at work.

## 2015-09-03 ENCOUNTER — Other Ambulatory Visit: Payer: Self-pay | Admitting: Internal Medicine

## 2015-09-03 MED ORDER — FENOFIBRATE 160 MG PO TABS: 160.0000 mg | ORAL_TABLET | Freq: Every day | ORAL | Status: DC

## 2015-09-03 NOTE — Telephone Encounter (Signed)
Pt return call  Date of birth verified by pt  Lab results given  Pt verbalized understanding  Notified Rx send to [place]

## 2015-10-12 MED FILL — ELIQUIS 5 MG TABLET: 5 | 30 days supply | Qty: 60 | Fill #3 | Status: TO

## 2015-10-22 ENCOUNTER — Emergency Department (HOSPITAL_COMMUNITY)
Admission: EM | Admit: 2015-10-22 | Discharge: 2015-10-22 | Disposition: A | Discharge status: Home / Self Care | Payer: 59 | Attending: Emergency Medicine | Admitting: Emergency Medicine

## 2015-10-22 ENCOUNTER — Encounter (HOSPITAL_COMMUNITY): Payer: Self-pay | Admitting: Emergency Medicine

## 2015-10-22 DIAGNOSIS — G4733 Obstructive sleep apnea (adult) (pediatric): Secondary | ICD-10-CM | POA: Insufficient documentation

## 2015-10-22 DIAGNOSIS — Z87891 Personal history of nicotine dependence: Secondary | ICD-10-CM | POA: Diagnosis not present

## 2015-10-22 DIAGNOSIS — Z7901 Long term (current) use of anticoagulants: Secondary | ICD-10-CM | POA: Insufficient documentation

## 2015-10-22 DIAGNOSIS — I10 Essential (primary) hypertension: Secondary | ICD-10-CM | POA: Diagnosis not present

## 2015-10-22 DIAGNOSIS — Z9889 Other specified postprocedural states: Secondary | ICD-10-CM | POA: Diagnosis not present

## 2015-10-22 DIAGNOSIS — R197 Diarrhea, unspecified: Secondary | ICD-10-CM | POA: Diagnosis not present

## 2015-10-22 DIAGNOSIS — I4891 Unspecified atrial fibrillation: Secondary | ICD-10-CM | POA: Insufficient documentation

## 2015-10-22 DIAGNOSIS — Z9981 Dependence on supplemental oxygen: Secondary | ICD-10-CM | POA: Diagnosis not present

## 2015-10-22 DIAGNOSIS — Z8739 Personal history of other diseases of the musculoskeletal system and connective tissue: Secondary | ICD-10-CM | POA: Insufficient documentation

## 2015-10-22 DIAGNOSIS — Z79899 Other long term (current) drug therapy: Secondary | ICD-10-CM | POA: Insufficient documentation

## 2015-10-22 DIAGNOSIS — Z792 Long term (current) use of antibiotics: Secondary | ICD-10-CM | POA: Diagnosis not present

## 2015-10-22 DIAGNOSIS — I509 Heart failure, unspecified: Secondary | ICD-10-CM | POA: Diagnosis not present

## 2015-10-22 DIAGNOSIS — K921 Melena: Secondary | ICD-10-CM | POA: Insufficient documentation

## 2015-10-22 LAB — COMPREHENSIVE METABOLIC PANEL
ALK PHOS: 75 U/L (ref 38–126)
ALT: 21 U/L (ref 17–63)
AST: 24 U/L (ref 15–41)
Albumin: 3.6 g/dL (ref 3.5–5.0)
Anion gap: 9 (ref 5–15)
BUN: 10 mg/dL (ref 6–20)
CALCIUM: 9.1 mg/dL (ref 8.9–10.3)
CHLORIDE: 109 mmol/L (ref 101–111)
CO2: 22 mmol/L (ref 22–32)
CREATININE: 0.71 mg/dL (ref 0.61–1.24)
GFR calc Af Amer: >60 mL/min (ref >60)
GFR calc non Af Amer: >60 mL/min (ref >60)
Glucose, Bld: 107 mg/dL — ABNORMAL HIGH (ref 65–99)
Potassium: 4.2 mmol/L (ref 3.5–5.1)
SODIUM: 140 mmol/L (ref 135–145)
Total Bilirubin: 0.3 mg/dL (ref 0.3–1.2)
Total Protein: 6.5 g/dL (ref 6.5–8.1)

## 2015-10-22 LAB — HEMOGLOBIN AND HEMATOCRIT, BLOOD
HCT: 41.1 % (ref 39.0–52.0)
HEMOGLOBIN: 13.9 g/dL (ref 13.0–17.0)

## 2015-10-22 LAB — CBC
HCT: 41.2 % (ref 39.0–52.0)
Hemoglobin: 13.7 g/dL (ref 13.0–17.0)
MCH: 28.8 pg (ref 26.0–34.0)
MCHC: 33.3 g/dL (ref 30.0–36.0)
MCV: 86.6 fL (ref 78.0–100.0)
PLATELETS: 129 10*3/uL — AB (ref 150–400)
RBC: 4.76 MIL/uL (ref 4.22–5.81)
RDW: 13.3 % (ref 11.5–15.5)
WBC: 7.1 10*3/uL (ref 4.0–10.5)

## 2015-10-22 LAB — TYPE AND SCREEN
ABO/RH(D): B POS
Antibody Screen: NEGATIVE

## 2015-10-22 LAB — APTT: APTT: 26 s (ref 24–37)

## 2015-10-22 LAB — PROTIME-INR
INR: 1.03 (ref 0.00–1.49)
Prothrombin Time: 13.7 seconds (ref 11.6–15.2)

## 2015-10-22 LAB — ABO/RH: ABO/RH(D): B POS

## 2015-10-22 LAB — POC OCCULT BLOOD, ED: Fecal Occult Bld: POSITIVE — AB

## 2015-10-22 NOTE — ED Notes (Signed)
Pt reports blood in stool since yesterday. Pt denies any other symptoms. Pt does take blood thinners. Pt alert x4. NAD at this time.
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
## 2015-10-22 NOTE — Discharge Instructions (Signed)
You have been seen today for rectal bleeding. Your lab tests showed no systemic abnormalities. Follow-up with the gastroenterologist as soon as possible. Call the number provided to set up an appointment. You should also follow up with your cardiologist to determine if you should remain on the Eliquis. Keep taking all your medications, including the Eliquis, unless your doctor tells you otherwise. Call Dr. Hassell Done office to ask about the Eliquis and schedule an appointment, if necessary. Return to ED should symptoms worsen or you begin to have pain with the bleeding.

## 2015-10-22 NOTE — ED Notes (Signed)
Pt stable, ambulatory, states understanding of discharge instructions 

## 2015-11-02 ENCOUNTER — Telehealth: Payer: Self-pay | Admitting: Interventional Cardiology

## 2015-11-02 NOTE — Telephone Encounter (Signed)
Patient st he stopped taking his Eliquis two weeks ago when he had a frank bloody bowel movement. The 2 BMs after had dark red blood, then he had a regular bowel movement.  He then had one more dark bowel movement and for the last week they have been normal. 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 They sent him home to [place] to take care of it. He saw GI and has a colonoscopy scheduled 7/28.  He wants to know what to do about his Eliquis. Offered the patient an overdue OV to discuss medications, but he declined as he is going back to [place] to work on Monday. He st he will call as soon as he gets back to schedule a visit.  He requests a call next week from Dr. Hassell Done nurse with medication recommendations.

## 2015-11-02 NOTE — Telephone Encounter (Signed)
New Message  Pt requested to speak with RN. Pt stated his meds..   Pt c/o medication issue:  1. Name of Medication: Eliqus  2. How are you currently taking this medication (dosage and times per day)? Not taking   3. Are you having a reaction (difficulty breathing--STAT)? Med are really strong  4. What is your medication issue? Pt has been taking Eliqus since being treated of finding blood in stool. Pt has stop taking the Eliqus since the bleeding has stop. Pt states he has an appt for colonoscopy and endoscopy procedure and wants to ask the RN about starting the eliqus again. Please call back to advise

## 2015-11-05 NOTE — Telephone Encounter (Signed)
I would recommend going back on Eliquis if bleeeding has stopped to prevent stroke.  If bleeding recurs, he will need to seek attention.

## 2015-11-05 NOTE — Telephone Encounter (Signed)
Instructed patient to RESTART ELIQUIS if bleeding has stopped. He understands to stop and seem medical attention if bleeding reoccurs.  He confirms he will call the office once he is home to schedule an OV. He was grateful for call.

## 2015-11-16 ENCOUNTER — Telehealth: Payer: Self-pay

## 2015-11-16 NOTE — Telephone Encounter (Signed)
**Note De-Identified [name] Obfuscation** I have faxed this message to [facility] @ [phone]. I did receive a confirmation that the fax went sucessfully.

## 2015-11-16 NOTE — Telephone Encounter (Signed)
O to hold Eliquis 48 hours prior to GI procedure

## 2015-11-16 NOTE — Telephone Encounter (Signed)
Pt has a CHADS score of 2.  Okay to hold Eliquis x 48 hours prior to procedure.

## 2015-11-16 NOTE — Telephone Encounter (Signed)
**Note De-Identified [name] Obfuscation** The pt needs to have a Colonoscopy and Endoscopy for rectal bleeding on 12/25/15. They are asking that the pt temporarily stop Eliquis before these procedures. They need to know when you recommend that he stop taking Eliquis and when he should resume if polyps are removed.   The pt is is more than a year overdue for f/u and did not have Echo that was ordered at his last OV on 08/04/14.  I will attempt to get the pt an appt prior to procedures if Dr Irish Lack cannot give cardiac clearance without f/u, otherwise I will contact the pt to schedule a f/u as he is overdue.  Please advise.

## 2015-11-16 NOTE — Telephone Encounter (Signed)
How long to stop Eliquis in this patient with cardiomyopathy?

## 2015-12-13 MED FILL — ELIQUIS 5 MG TABLET: 5 | 30 days supply | Qty: 60 | Fill #4 | Status: TO

## 2016-02-08 ENCOUNTER — Other Ambulatory Visit: Payer: Self-pay | Admitting: Internal Medicine

## 2016-02-22 MED FILL — ELIQUIS 5 MG TABLET: 5 | 30 days supply | Qty: 60 | Fill #0

## 2016-03-07 IMAGING — DX DG ELBOW COMPLETE 3+V*R*
4 series · 4 of 4 positions shown · non-contrast
Comparison: None.

CLINICAL DATA: 64-year-old with acute onset of swelling and
erythema involving the soft tissues overlying the olecranon 3 days
ago, with subsequent spread of the erythema into the right upper arm
and right forearm (cellulitis). No recent injuries. Patient works
with metal. Evaluate for possible foreign body.

EXAM:
RIGHT ELBOW - COMPLETE 3+ VIEW

[x elbow obl right (1 of 3)]
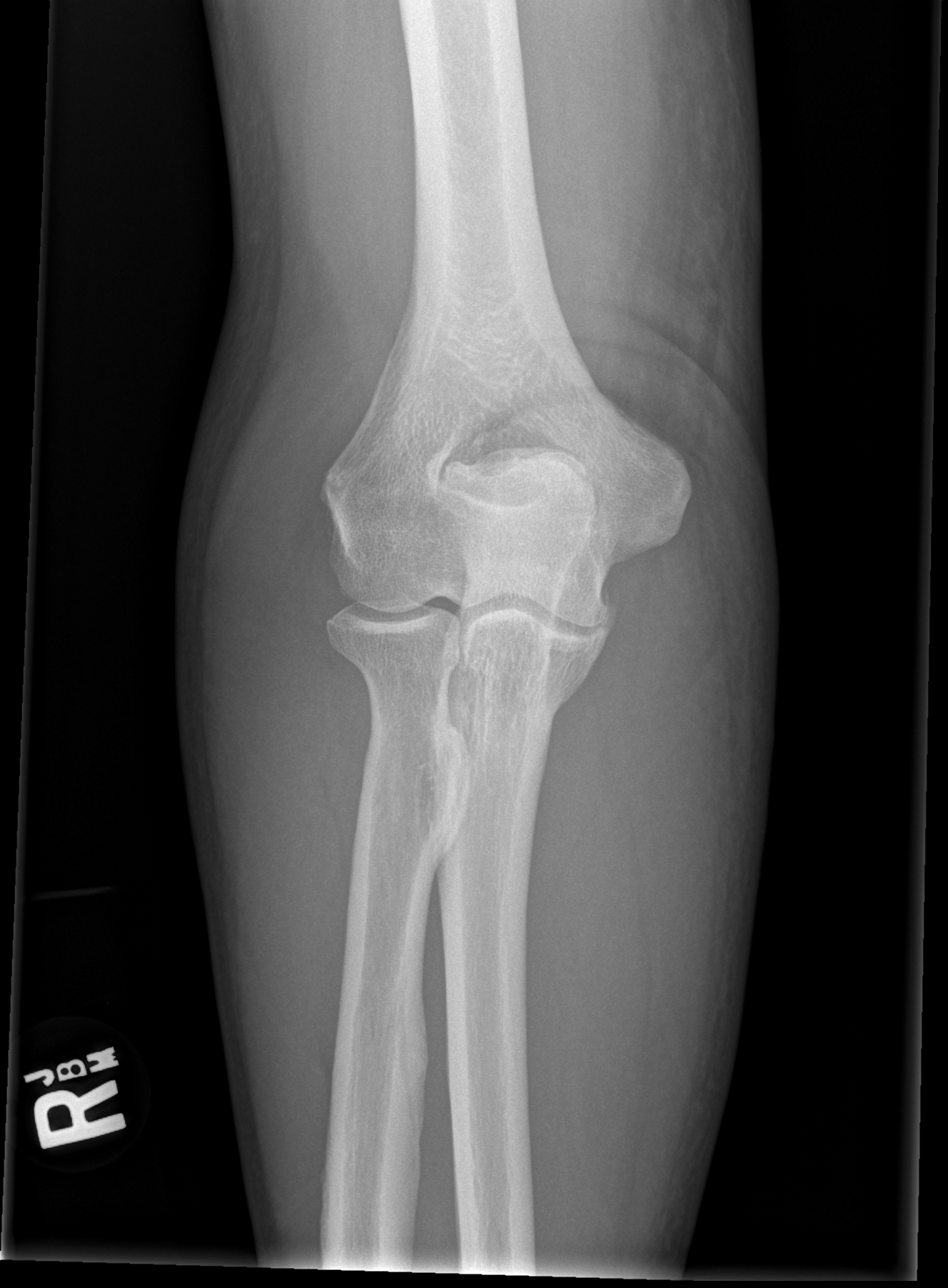

[x elbow obl right (2 of 3)]
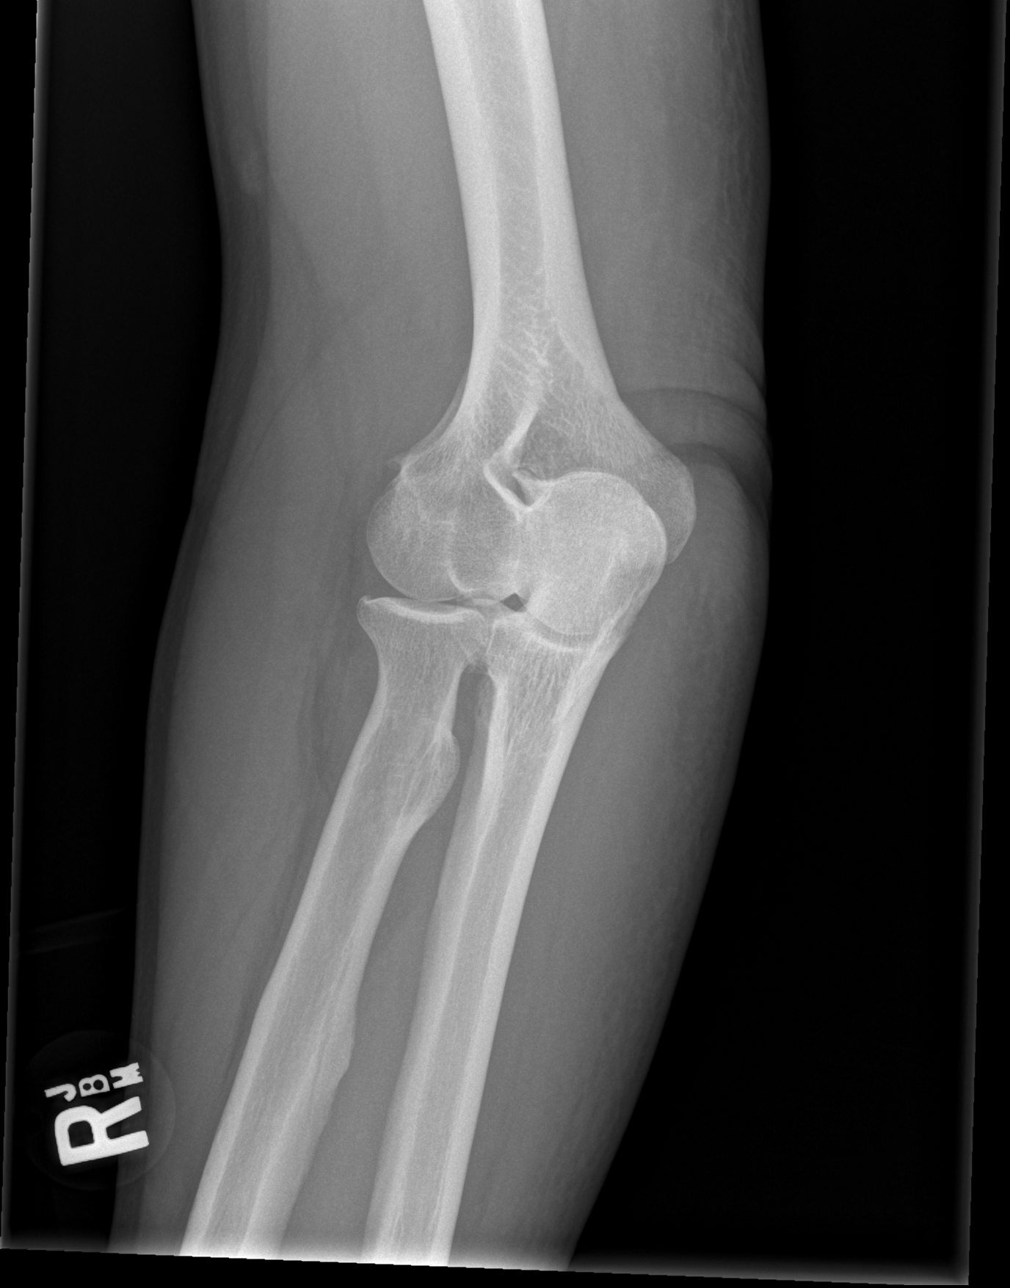

[x elbow obl right (3 of 3)]
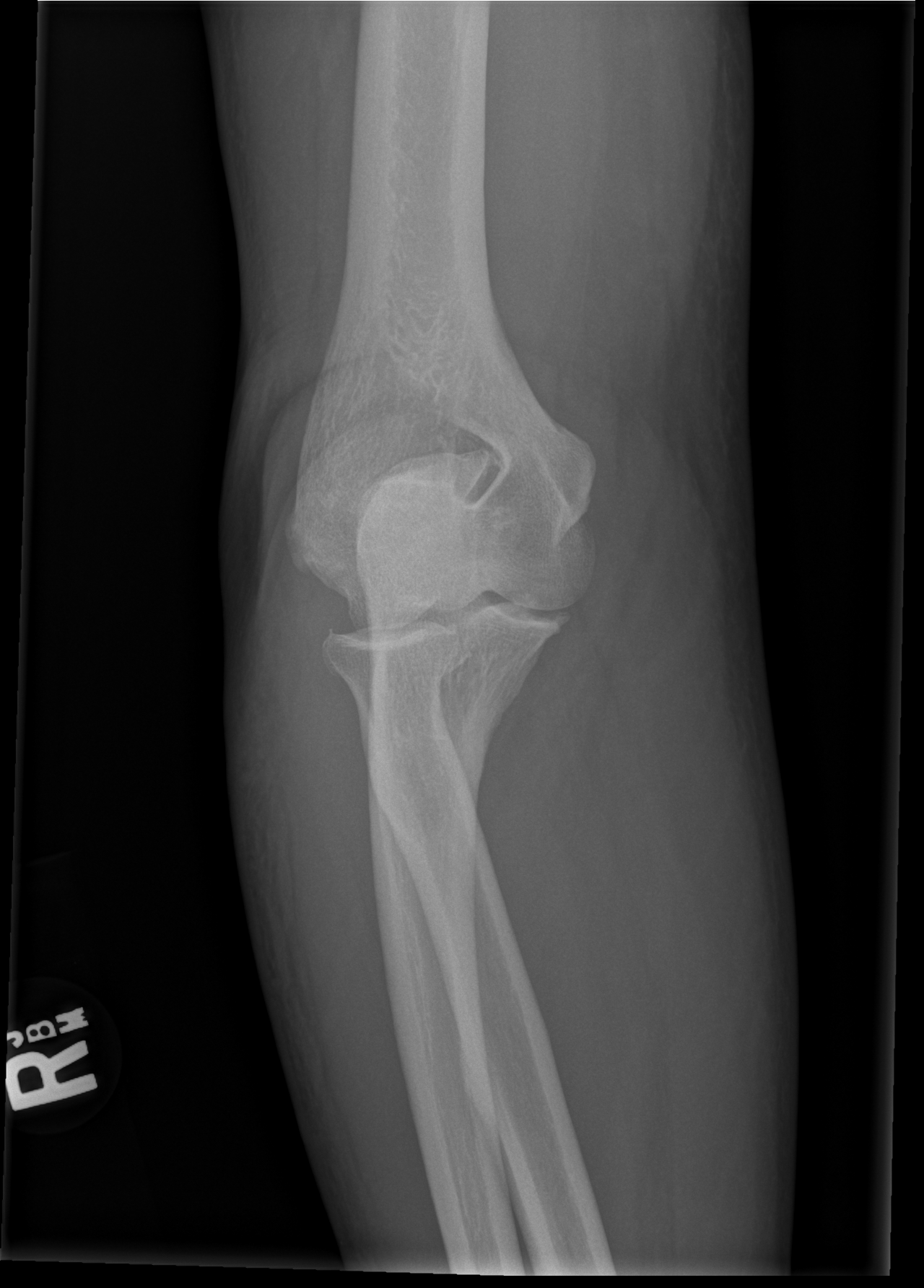

[x elbow lat right]
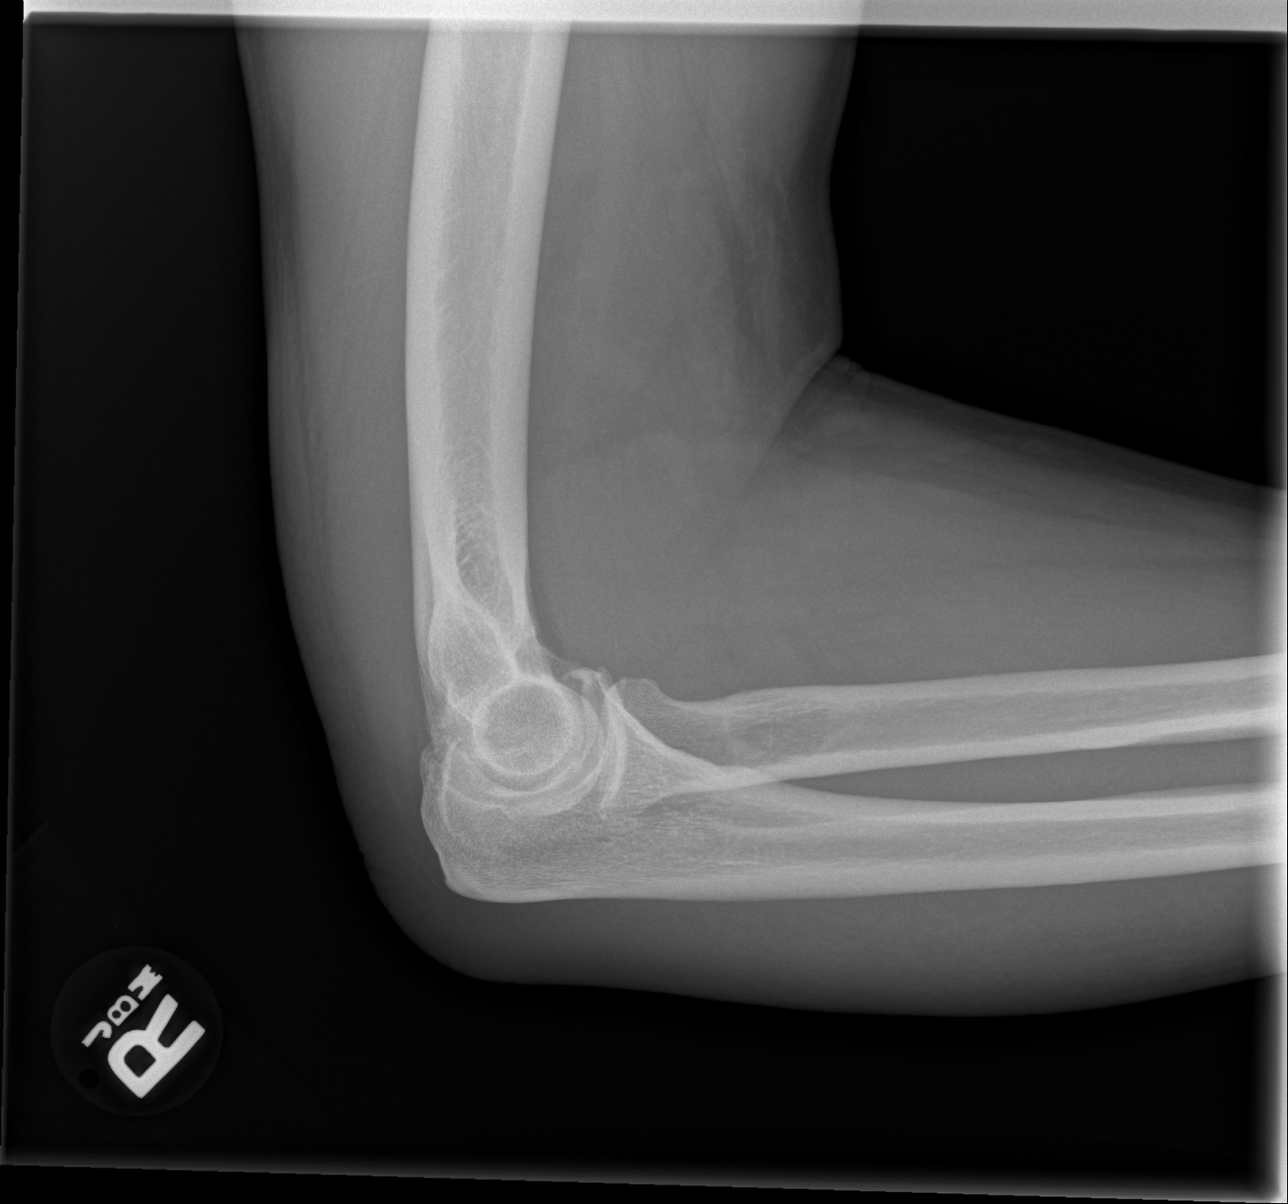

[4 of 4 positions shown; findings below may reference images not displayed]

FINDINGS: Mild soft tissue swelling overlying the olecranon and mild edema in
the subcutaneous fat of the distal upper arm and proximal forearm.
No metallic foreign bodies in the soft tissues. No acute, subacute
or healed fractures. No evidence of osteomyelitis. Well preserved
joint spaces, though there is mild hypertrophic spurring involving
the radial head and the olecranon. Well preserved bone mineral
density. No visible joint effusion.
IMPRESSION: 1. No acute or subacute osseous abnormality.
2. No metallic foreign body in the soft tissues.
3. Mild osteoarthritis.

## 2016-05-30 ENCOUNTER — Other Ambulatory Visit: Payer: Self-pay | Admitting: Internal Medicine

## 2016-05-30 ENCOUNTER — Other Ambulatory Visit: Payer: Self-pay | Admitting: Interventional Cardiology

## 2016-06-24 ENCOUNTER — Other Ambulatory Visit: Payer: Self-pay | Admitting: Interventional Cardiology

## 2016-06-24 NOTE — Telephone Encounter (Signed)
Pt overdue with app with Dr Irish Lack

## 2016-07-28 ENCOUNTER — Ambulatory Visit (HOSPITAL_BASED_OUTPATIENT_CLINIC_OR_DEPARTMENT_OTHER): Payer: 59 | Admitting: Family Medicine

## 2016-07-28 ENCOUNTER — Other Ambulatory Visit: Payer: Self-pay | Admitting: Family Medicine

## 2016-07-28 ENCOUNTER — Ambulatory Visit (HOSPITAL_COMMUNITY)
Admission: RE | Admit: 2016-07-28 | Discharge: 2016-07-28 | Disposition: A | Discharge status: Home / Self Care | Payer: 59 | Source: Ambulatory Visit | Attending: Family Medicine | Admitting: Family Medicine

## 2016-07-28 ENCOUNTER — Encounter (HOSPITAL_COMMUNITY): Payer: Self-pay

## 2016-07-28 ENCOUNTER — Encounter: Payer: Self-pay | Admitting: Family Medicine

## 2016-07-28 VITALS — BP 164/95 | HR 69 | Temp 98.1°F | Ht 71.0 in | Wt 223.8 lb

## 2016-07-28 DIAGNOSIS — L03031 Cellulitis of right toe: Secondary | ICD-10-CM

## 2016-07-28 DIAGNOSIS — I110 Hypertensive heart disease with heart failure: Secondary | ICD-10-CM | POA: Insufficient documentation

## 2016-07-28 DIAGNOSIS — Z7901 Long term (current) use of anticoagulants: Secondary | ICD-10-CM

## 2016-07-28 DIAGNOSIS — I10 Essential (primary) hypertension: Secondary | ICD-10-CM

## 2016-07-28 DIAGNOSIS — I96 Gangrene, not elsewhere classified: Secondary | ICD-10-CM

## 2016-07-28 DIAGNOSIS — H6123 Impacted cerumen, bilateral: Secondary | ICD-10-CM

## 2016-07-28 DIAGNOSIS — G4733 Obstructive sleep apnea (adult) (pediatric): Secondary | ICD-10-CM

## 2016-07-28 DIAGNOSIS — I429 Cardiomyopathy, unspecified: Secondary | ICD-10-CM

## 2016-07-28 DIAGNOSIS — Z79899 Other long term (current) drug therapy: Secondary | ICD-10-CM | POA: Insufficient documentation

## 2016-07-28 DIAGNOSIS — I509 Heart failure, unspecified: Secondary | ICD-10-CM

## 2016-07-28 DIAGNOSIS — M79674 Pain in right toe(s): Secondary | ICD-10-CM | POA: Diagnosis not present

## 2016-07-28 DIAGNOSIS — I4891 Unspecified atrial fibrillation: Secondary | ICD-10-CM | POA: Insufficient documentation

## 2016-07-28 DIAGNOSIS — M869 Osteomyelitis, unspecified: Secondary | ICD-10-CM | POA: Diagnosis not present

## 2016-07-28 MED ORDER — CARBAMIDE PEROXIDE 6.5 % OT SOLN: 5.0000 [drp] | Freq: Two times a day (BID) | OTIC | 0 refills | Status: DC

## 2016-07-28 MED ORDER — CEPHALEXIN 500 MG PO CAPS: 500.0000 mg | ORAL_CAPSULE | Freq: Four times a day (QID) | ORAL | 0 refills | Status: DC
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
## 2016-07-28 NOTE — Patient Instructions (Signed)

## 2016-07-29 ENCOUNTER — Telehealth: Payer: Self-pay | Admitting: Internal Medicine

## 2016-07-29 NOTE — Telephone Encounter (Signed)
Will forward to kay 

## 2016-07-29 NOTE — Telephone Encounter (Signed)
Pt. Called back requesting his x-ray results. Please f/u

## 2016-07-30 ENCOUNTER — Telehealth: Payer: Self-pay

## 2016-07-30 ENCOUNTER — Encounter (HOSPITAL_COMMUNITY): Payer: Self-pay | Admitting: *Deleted

## 2016-07-30 ENCOUNTER — Inpatient Hospital Stay (HOSPITAL_COMMUNITY): Payer: 59

## 2016-07-30 ENCOUNTER — Inpatient Hospital Stay (HOSPITAL_COMMUNITY)
Admission: EM | Admit: 2016-07-30 | Discharge: 2016-08-02 | DRG: 464 | Disposition: A | Discharge status: Home / Self Care | Payer: 59 | Attending: Internal Medicine | Admitting: Internal Medicine

## 2016-07-30 DIAGNOSIS — Z8711 Personal history of peptic ulcer disease: Secondary | ICD-10-CM

## 2016-07-30 DIAGNOSIS — Z79899 Other long term (current) drug therapy: Secondary | ICD-10-CM

## 2016-07-30 DIAGNOSIS — E785 Hyperlipidemia, unspecified: Secondary | ICD-10-CM | POA: Diagnosis present

## 2016-07-30 DIAGNOSIS — L03031 Cellulitis of right toe: Secondary | ICD-10-CM | POA: Diagnosis present

## 2016-07-30 DIAGNOSIS — M7989 Other specified soft tissue disorders: Secondary | ICD-10-CM

## 2016-07-30 DIAGNOSIS — Z87891 Personal history of nicotine dependence: Secondary | ICD-10-CM | POA: Diagnosis not present

## 2016-07-30 DIAGNOSIS — I160 Hypertensive urgency: Secondary | ICD-10-CM | POA: Diagnosis present

## 2016-07-30 DIAGNOSIS — M869 Osteomyelitis, unspecified: Principal | ICD-10-CM | POA: Diagnosis present

## 2016-07-30 DIAGNOSIS — I10 Essential (primary) hypertension: Secondary | ICD-10-CM | POA: Diagnosis not present

## 2016-07-30 DIAGNOSIS — I110 Hypertensive heart disease with heart failure: Secondary | ICD-10-CM | POA: Diagnosis present

## 2016-07-30 DIAGNOSIS — L97502 Non-pressure chronic ulcer of other part of unspecified foot with fat layer exposed: Secondary | ICD-10-CM | POA: Diagnosis not present

## 2016-07-30 DIAGNOSIS — E039 Hypothyroidism, unspecified: Secondary | ICD-10-CM | POA: Diagnosis present

## 2016-07-30 DIAGNOSIS — I4891 Unspecified atrial fibrillation: Secondary | ICD-10-CM | POA: Diagnosis present

## 2016-07-30 DIAGNOSIS — L97513 Non-pressure chronic ulcer of other part of right foot with necrosis of muscle: Secondary | ICD-10-CM | POA: Diagnosis present

## 2016-07-30 DIAGNOSIS — G4733 Obstructive sleep apnea (adult) (pediatric): Secondary | ICD-10-CM | POA: Diagnosis present

## 2016-07-30 DIAGNOSIS — I482 Chronic atrial fibrillation: Secondary | ICD-10-CM | POA: Diagnosis not present

## 2016-07-30 DIAGNOSIS — Z7901 Long term (current) use of anticoagulants: Secondary | ICD-10-CM

## 2016-07-30 DIAGNOSIS — M79674 Pain in right toe(s): Secondary | ICD-10-CM | POA: Insufficient documentation

## 2016-07-30 DIAGNOSIS — I5022 Chronic systolic (congestive) heart failure: Secondary | ICD-10-CM | POA: Diagnosis present

## 2016-07-30 DIAGNOSIS — I428 Other cardiomyopathies: Secondary | ICD-10-CM | POA: Diagnosis present

## 2016-07-30 DIAGNOSIS — G4730 Sleep apnea, unspecified: Secondary | ICD-10-CM | POA: Diagnosis present

## 2016-07-30 DIAGNOSIS — Z9889 Other specified postprocedural states: Secondary | ICD-10-CM

## 2016-07-30 LAB — COMPREHENSIVE METABOLIC PANEL
ALBUMIN: 3.9 g/dL (ref 3.5–5.0)
ALK PHOS: 88 U/L (ref 38–126)
ALT: 51 U/L (ref 17–63)
AST: 40 U/L (ref 15–41)
Anion gap: 10 (ref 5–15)
BILIRUBIN TOTAL: 0.9 mg/dL (ref 0.3–1.2)
BUN: 8 mg/dL (ref 6–20)
CALCIUM: 9.1 mg/dL (ref 8.9–10.3)
CO2: 23 mmol/L (ref 22–32)
CREATININE: 0.7 mg/dL (ref 0.61–1.24)
Chloride: 107 mmol/L (ref 101–111)
GFR calc Af Amer: >60 mL/min (ref >60)
GFR calc non Af Amer: >60 mL/min (ref >60)
GLUCOSE: 86 mg/dL (ref 65–99)
Potassium: 3.6 mmol/L (ref 3.5–5.1)
Sodium: 140 mmol/L (ref 135–145)
TOTAL PROTEIN: 7.1 g/dL (ref 6.5–8.1)

## 2016-07-30 LAB — CBC WITH DIFFERENTIAL/PLATELET
Basophils Absolute: 0 10*3/uL (ref 0.0–0.1)
Basophils Relative: 0 %
Eosinophils Absolute: 0.1 10*3/uL (ref 0.0–0.7)
Eosinophils Relative: 2 %
HEMATOCRIT: 46.9 % (ref 39.0–52.0)
HEMOGLOBIN: 16.1 g/dL (ref 13.0–17.0)
LYMPHS ABS: 1.6 10*3/uL (ref 0.7–4.0)
Lymphocytes Relative: 22 %
MCH: 29.9 pg (ref 26.0–34.0)
MCHC: 34.3 g/dL (ref 30.0–36.0)
MCV: 87 fL (ref 78.0–100.0)
MONOS PCT: 9 %
Monocytes Absolute: 0.7 10*3/uL (ref 0.1–1.0)
NEUTROS ABS: 5 10*3/uL (ref 1.7–7.7)
Neutrophils Relative %: 67 %
Platelets: 155 10*3/uL (ref 150–400)
RBC: 5.39 MIL/uL (ref 4.22–5.81)
RDW: 13.3 % (ref 11.5–15.5)
WBC: 7.4 10*3/uL (ref 4.0–10.5)

## 2016-07-30 LAB — SEDIMENTATION RATE: SED RATE: 9 mm/h (ref 0–16)

## 2016-07-30 LAB — I-STAT CG4 LACTIC ACID, ED
Lactic Acid, Venous: 1.06 mmol/L (ref 0.5–1.9)
Lactic Acid, Venous: 0.78 mmol/L (ref 0.5–1.9)

## 2016-07-30 MED ORDER — VANCOMYCIN HCL IN DEXTROSE 1-5 GM/200ML-% IV SOLN
1000.0000 mg | Freq: Once | INTRAVENOUS | Status: AC
  Administered 2016-07-30: 1000 mg via INTRAVENOUS
  Filled 2016-07-30: qty 200

## 2016-07-30 MED ORDER — ONDANSETRON HCL 4 MG/2ML IJ SOLN
4.0000 mg | Freq: Four times a day (QID) | INTRAMUSCULAR | Status: DC | PRN
  Administered 2016-07-31 – 2016-08-02 (×3): 4 mg via INTRAVENOUS
  Filled 2016-07-30 (×2): qty 2

## 2016-07-30 MED ORDER — FUROSEMIDE 40 MG PO TABS
40.0000 mg | ORAL_TABLET | Freq: Two times a day (BID) | ORAL | Status: DC
  Administered 2016-08-01 – 2016-08-02 (×2): 40 mg via ORAL
  Filled 2016-07-30 (×4): qty 1

## 2016-07-30 MED ORDER — HYDRALAZINE HCL 20 MG/ML IJ SOLN: 10.0000 mg | INTRAMUSCULAR | Status: DC | PRN

## 2016-07-30 MED ORDER — FUROSEMIDE 10 MG/ML IJ SOLN
20.0000 mg | Freq: Once | INTRAMUSCULAR | Status: AC
  Administered 2016-07-30: 20 mg via INTRAVENOUS
  Filled 2016-07-30: qty 2

## 2016-07-30 MED ORDER — ACETAMINOPHEN 650 MG RE SUPP: 650.0000 mg | Freq: Four times a day (QID) | RECTAL | Status: DC | PRN

## 2016-07-30 MED ORDER — HYDROCODONE-ACETAMINOPHEN 5-325 MG PO TABS
1.0000 | ORAL_TABLET | ORAL | Status: DC | PRN
  Administered 2016-07-30 – 2016-08-02 (×4): 2 via ORAL
  Filled 2016-07-30 (×4): qty 2

## 2016-07-30 MED ORDER — LABETALOL HCL 5 MG/ML IV SOLN
10.0000 mg | INTRAVENOUS | Status: DC | PRN
  Filled 2016-07-30: qty 4

## 2016-07-30 MED ORDER — LEVOTHYROXINE SODIUM 25 MCG PO TABS
25.0000 ug | ORAL_TABLET | Freq: Every day | ORAL | Status: DC
  Administered 2016-07-31 – 2016-08-02 (×3): 25 ug via ORAL
  Filled 2016-07-30 (×4): qty 1

## 2016-07-30 MED ORDER — HEPARIN BOLUS VIA INFUSION
5000.0000 [IU] | Freq: Once | INTRAVENOUS | Status: AC
  Administered 2016-07-30: 5000 [IU] via INTRAVENOUS
  Filled 2016-07-30: qty 5000

## 2016-07-30 MED ORDER — VANCOMYCIN HCL IN DEXTROSE 1-5 GM/200ML-% IV SOLN
1000.0000 mg | Freq: Three times a day (TID) | INTRAVENOUS | Status: DC
  Administered 2016-07-30 – 2016-08-02 (×7): 1000 mg via INTRAVENOUS
  Filled 2016-07-30 (×9): qty 200

## 2016-07-30 MED ORDER — ATORVASTATIN CALCIUM 40 MG PO TABS
40.0000 mg | ORAL_TABLET | Freq: Every day | ORAL | Status: DC
  Administered 2016-07-30 – 2016-08-01 (×3): 40 mg via ORAL
  Filled 2016-07-30 (×3): qty 1

## 2016-07-30 MED ORDER — METOPROLOL TARTRATE 50 MG PO TABS
50.0000 mg | ORAL_TABLET | Freq: Two times a day (BID) | ORAL | Status: DC
  Administered 2016-07-30 – 2016-08-02 (×6): 50 mg via ORAL
  Filled 2016-07-30 (×6): qty 1

## 2016-07-30 MED ORDER — POLYETHYLENE GLYCOL 3350 17 G PO PACK: 17.0000 g | PACK | Freq: Every day | ORAL | Status: DC | PRN

## 2016-07-30 MED ORDER — GADOBENATE DIMEGLUMINE 529 MG/ML IV SOLN
20.0000 mL | Freq: Once | INTRAVENOUS | Status: AC
  Administered 2016-07-30: 20 mL via INTRAVENOUS

## 2016-07-30 MED ORDER — SODIUM CHLORIDE 0.9 % IV SOLN: 250.0000 mL | INTRAVENOUS | Status: DC | PRN

## 2016-07-30 MED ORDER — BISACODYL 5 MG PO TBEC
5.0000 mg | DELAYED_RELEASE_TABLET | Freq: Every day | ORAL | Status: DC | PRN
  Filled 2016-07-30: qty 1

## 2016-07-30 MED ORDER — HEPARIN (PORCINE) IN NACL 100-0.45 UNIT/ML-% IJ SOLN
1850.0000 [IU]/h | INTRAMUSCULAR | Status: DC
  Administered 2016-07-30: 1300 [IU]/h via INTRAVENOUS
  Administered 2016-07-31: 1500 [IU]/h via INTRAVENOUS
  Administered 2016-08-02: 1850 [IU]/h via INTRAVENOUS
  Filled 2016-07-30 (×6): qty 250

## 2016-07-30 MED ORDER — ACETAMINOPHEN 325 MG PO TABS: 650.0000 mg | ORAL_TABLET | Freq: Four times a day (QID) | ORAL | Status: DC | PRN

## 2016-07-30 MED ORDER — SODIUM CHLORIDE 0.9% FLUSH: 3.0000 mL | INTRAVENOUS | Status: DC | PRN

## 2016-07-30 MED ORDER — AMIODARONE HCL 100 MG PO TABS
200.0000 mg | ORAL_TABLET | Freq: Two times a day (BID) | ORAL | Status: DC
  Administered 2016-07-30 – 2016-08-02 (×6): 200 mg via ORAL
  Filled 2016-07-30 (×6): qty 2

## 2016-07-30 MED ORDER — ONDANSETRON HCL 4 MG PO TABS: 4.0000 mg | ORAL_TABLET | Freq: Four times a day (QID) | ORAL | Status: DC | PRN

## 2016-07-30 MED ORDER — SODIUM CHLORIDE 0.9% FLUSH
3.0000 mL | Freq: Two times a day (BID) | INTRAVENOUS | Status: DC
  Administered 2016-07-30 – 2016-08-01 (×4): 3 mL via INTRAVENOUS

## 2016-07-30 NOTE — Telephone Encounter (Signed)
-----   Message from Trevor Morale, MD sent at 07/29/2016  1:40 PM EST ----- Please inform this patient that I called him but was unable to reach him. His foot x-ray is concerning for osteomyelitis (infection of the bone) and I will need him to go to the ED for this so he can be placed on adequate therapy.

## 2016-07-30 NOTE — ED Notes (Signed)
To MRI

## 2016-07-30 NOTE — Telephone Encounter (Signed)
Writer spoke with patient today about his foot xray per Dr. Jarold Song.  Patient will go to the ED now as instructed.

## 2016-07-30 NOTE — Progress Notes (Signed)
Pharmacy Antibiotic Note  [name] is a 66 y.o. male admitted on 07/30/2016 with osteomyelitis.  Pharmacy has been consulted for vancomycin dosing. Pt is afebrile and WBC is WNL. SCr and lactic acid is WNL.   Plan: Vancomycin 1gm IV Q8H F/u renal fxn, C&S, clinical status and trough at SS  Height: 5\' 11"  (180.3 cm) Weight: 223 lb (101.2 kg) IBW/kg (Calculated) : 75.3  Temp (24hrs), Avg:98.2 F (36.8 C), Min:98.2 F (36.8 C), Max:98.2 F (36.8 C)   Recent Labs Lab 07/30/16 1451 07/30/16 1511 07/30/16 1803  WBC 7.4  --   --   CREATININE 0.70  --   --   LATICACIDVEN  --  1.06 0.78    Estimated Creatinine Clearance: 114.6 mL/min (by C-G formula based on SCr of 0.7 mg/dL).    No Known Allergies  Antimicrobials this admission: Vanc 2/28>>  Dose adjustments this admission: N/A  Microbiology results: Pending  Thank you for allowing pharmacy to be a part of this patient's care.  Markie Heffernan, Rande Lawman 07/30/2016 7:41 PM

## 2016-07-30 NOTE — Progress Notes (Signed)
Pt. Refused cpap. RT informed pt. To notify if he changes his mind. 

## 2016-07-30 NOTE — ED Notes (Signed)
Report attempted x 1 at 30

## 2016-07-30 NOTE — ED Notes (Signed)
Dr. Glick at bedside.  

## 2016-07-30 NOTE — ED Notes (Signed)
Dr. Myna Hidalgo states to get blood cultures prior to starting abx

## 2016-07-30 NOTE — ED Notes (Signed)
Both sets of blood cultures drawn prior to starting abx
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
## 2016-07-30 NOTE — Telephone Encounter (Signed)
Writer called patient again today trying to talk with him about his foot xray which is concerning.  LVM asking patient again to return this call as soon as possible.

## 2016-07-30 NOTE — ED Notes (Signed)
Patient belonging taken from ED to 5N room 3
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
## 2016-07-30 NOTE — ED Triage Notes (Signed)
PT states he was sent here by pcp for foot infection.  States was told that "the x-ray showed there was infection in the bone".

## 2016-07-31 ENCOUNTER — Encounter (HOSPITAL_COMMUNITY)
Admission: EM | Disposition: A | Discharge status: Home / Self Care | Payer: Self-pay | Source: Home / Self Care | Attending: Internal Medicine

## 2016-07-31 ENCOUNTER — Inpatient Hospital Stay (HOSPITAL_COMMUNITY): Payer: 59 | Admitting: Certified Registered Nurse Anesthetist

## 2016-07-31 ENCOUNTER — Encounter (HOSPITAL_COMMUNITY): Payer: Self-pay

## 2016-07-31 DIAGNOSIS — L97502 Non-pressure chronic ulcer of other part of unspecified foot with fat layer exposed: Secondary | ICD-10-CM

## 2016-07-31 DIAGNOSIS — M869 Osteomyelitis, unspecified: Principal | ICD-10-CM

## 2016-07-31 HISTORY — PX: AMPUTATION TOE: SHX6595

## 2016-07-31 LAB — CBC
HCT: 46.4 % (ref 39.0–52.0)
Hemoglobin: 16.2 g/dL (ref 13.0–17.0)
MCH: 30.3 pg (ref 26.0–34.0)
MCHC: 34.9 g/dL (ref 30.0–36.0)
MCV: 86.7 fL (ref 78.0–100.0)
PLATELETS: 166 10*3/uL (ref 150–400)
RBC: 5.35 MIL/uL (ref 4.22–5.81)
RDW: 13.1 % (ref 11.5–15.5)
WBC: 8.8 10*3/uL (ref 4.0–10.5)

## 2016-07-31 LAB — SURGICAL PCR SCREEN
MRSA, PCR: NEGATIVE
STAPHYLOCOCCUS AUREUS: NEGATIVE

## 2016-07-31 LAB — PROTIME-INR
INR: 1.08
Prothrombin Time: 14 seconds (ref 11.4–15.2)

## 2016-07-31 LAB — BASIC METABOLIC PANEL
ANION GAP: 8 (ref 5–15)
BUN: 8 mg/dL (ref 6–20)
CALCIUM: 8.9 mg/dL (ref 8.9–10.3)
CO2: 29 mmol/L (ref 22–32)
CREATININE: 0.85 mg/dL (ref 0.61–1.24)
Chloride: 103 mmol/L (ref 101–111)
Glucose, Bld: 98 mg/dL (ref 65–99)
Potassium: 3.4 mmol/L — ABNORMAL LOW (ref 3.5–5.1)
Sodium: 140 mmol/L (ref 135–145)

## 2016-07-31 LAB — HEPARIN LEVEL (UNFRACTIONATED)
Heparin Unfractionated: 0.12 IU/mL — ABNORMAL LOW (ref 0.30–0.70)
Heparin Unfractionated: 0.17 IU/mL — ABNORMAL LOW (ref 0.30–0.70)
Heparin Unfractionated: 0.18 IU/mL — ABNORMAL LOW (ref 0.30–0.70)

## 2016-07-31 LAB — HIV ANTIBODY (ROUTINE TESTING W REFLEX): HIV SCREEN 4TH GENERATION: NONREACTIVE

## 2016-07-31 LAB — C-REACTIVE PROTEIN: CRP: <0.8 mg/dL (ref <1.0)

## 2016-07-31 SURGERY — AMPUTATION, TOE
Anesthesia: Monitor Anesthesia Care | Site: Foot | Laterality: Right

## 2016-07-31 MED ORDER — MIDAZOLAM HCL 5 MG/5ML IJ SOLN
INTRAMUSCULAR | Status: DC | PRN
  Administered 2016-07-31: 2 mg via INTRAVENOUS

## 2016-07-31 MED ORDER — PROPOFOL 500 MG/50ML IV EMUL
INTRAVENOUS | Status: DC | PRN
  Administered 2016-07-31: 75 ug/kg/min via INTRAVENOUS

## 2016-07-31 MED ORDER — ROPIVACAINE HCL 7.5 MG/ML IJ SOLN
INTRAMUSCULAR | Status: DC | PRN
  Administered 2016-07-31: 20 mL via PERINEURAL

## 2016-07-31 MED ORDER — MIDAZOLAM HCL 2 MG/2ML IJ SOLN
1.0000 mg | Freq: Once | INTRAMUSCULAR | Status: AC
  Administered 2016-07-31: 1 mg via INTRAVENOUS

## 2016-07-31 MED ORDER — FENTANYL CITRATE (PF) 100 MCG/2ML IJ SOLN
50.0000 ug | Freq: Once | INTRAMUSCULAR | Status: AC
  Administered 2016-07-31: 50 ug via INTRAVENOUS

## 2016-07-31 MED ORDER — HYDROMORPHONE HCL 1 MG/ML IJ SOLN: 0.2500 mg | INTRAMUSCULAR | Status: DC | PRN

## 2016-07-31 MED ORDER — FENTANYL CITRATE (PF) 100 MCG/2ML IJ SOLN
INTRAMUSCULAR | Status: DC | PRN
  Administered 2016-07-31: 100 ug via INTRAVENOUS

## 2016-07-31 MED ORDER — MIDAZOLAM HCL 2 MG/2ML IJ SOLN
INTRAMUSCULAR | Status: AC
  Filled 2016-07-31: qty 2

## 2016-07-31 MED ORDER — LIDOCAINE 2% (20 MG/ML) 5 ML SYRINGE
INTRAMUSCULAR | Status: AC
  Filled 2016-07-31: qty 25

## 2016-07-31 MED ORDER — PROMETHAZINE HCL 25 MG/ML IJ SOLN: 6.2500 mg | INTRAMUSCULAR | Status: DC | PRN

## 2016-07-31 MED ORDER — BUPIVACAINE HCL (PF) 0.5 % IJ SOLN
INTRAMUSCULAR | Status: AC
  Filled 2016-07-31: qty 30

## 2016-07-31 MED ORDER — PROPOFOL 10 MG/ML IV BOLUS
INTRAVENOUS | Status: AC
  Filled 2016-07-31: qty 20

## 2016-07-31 MED ORDER — POTASSIUM CHLORIDE CRYS ER 20 MEQ PO TBCR
40.0000 meq | EXTENDED_RELEASE_TABLET | Freq: Once | ORAL | Status: AC
  Administered 2016-07-31: 40 meq via ORAL
  Filled 2016-07-31: qty 2

## 2016-07-31 MED ORDER — CHLORHEXIDINE GLUCONATE 4 % EX LIQD
60.0000 mL | Freq: Once | CUTANEOUS | Status: DC
  Filled 2016-07-31: qty 15

## 2016-07-31 MED ORDER — 0.9 % SODIUM CHLORIDE (POUR BTL) OPTIME
TOPICAL | Status: DC | PRN
  Administered 2016-07-31: 1000 mL

## 2016-07-31 MED ORDER — FENTANYL CITRATE (PF) 100 MCG/2ML IJ SOLN
INTRAMUSCULAR | Status: AC
  Filled 2016-07-31: qty 2

## 2016-07-31 MED ORDER — MEPERIDINE HCL 25 MG/ML IJ SOLN: 6.2500 mg | INTRAMUSCULAR | Status: DC | PRN

## 2016-07-31 MED ORDER — ZOLPIDEM TARTRATE 5 MG PO TABS
5.0000 mg | ORAL_TABLET | Freq: Once | ORAL | Status: AC
  Administered 2016-07-31: 5 mg via ORAL
  Filled 2016-07-31: qty 1

## 2016-07-31 MED ORDER — LIDOCAINE-EPINEPHRINE (PF) 1 %-1:200000 IJ SOLN
INTRAMUSCULAR | Status: AC
  Filled 2016-07-31: qty 30

## 2016-07-31 MED ORDER — FENTANYL CITRATE (PF) 100 MCG/2ML IJ SOLN: 25.0000 ug | INTRAMUSCULAR | Status: DC | PRN

## 2016-07-31 MED ORDER — LIDOCAINE HCL (PF) 2 % IJ SOLN
INTRAMUSCULAR | Status: DC | PRN
  Administered 2016-07-31: 10 mL via PERINEURAL

## 2016-07-31 MED ORDER — ONDANSETRON HCL 4 MG/2ML IJ SOLN: 4.0000 mg | Freq: Once | INTRAMUSCULAR | Status: DC | PRN

## 2016-07-31 MED ORDER — MIDAZOLAM HCL 2 MG/2ML IJ SOLN
INTRAMUSCULAR | Status: AC
Start: 2016-07-31 — End: 2016-07-31
  Filled 2016-07-31: qty 2

## 2016-07-31 MED ORDER — OXYCODONE HCL 5 MG/5ML PO SOLN: 5.0000 mg | Freq: Once | ORAL | Status: DC | PRN

## 2016-07-31 MED ORDER — SUCCINYLCHOLINE CHLORIDE 200 MG/10ML IV SOSY
PREFILLED_SYRINGE | INTRAVENOUS | Status: AC
  Filled 2016-07-31: qty 10

## 2016-07-31 MED ORDER — LACTATED RINGERS IV SOLN
INTRAVENOUS | Status: DC
  Administered 2016-07-31 (×2): via INTRAVENOUS

## 2016-07-31 MED ORDER — PROPOFOL 10 MG/ML IV BOLUS
INTRAVENOUS | Status: DC | PRN
  Administered 2016-07-31: 10 mg via INTRAVENOUS
  Administered 2016-07-31: 20 mg via INTRAVENOUS

## 2016-07-31 MED ORDER — OXYCODONE HCL 5 MG PO TABS: 5.0000 mg | ORAL_TABLET | Freq: Once | ORAL | Status: DC | PRN

## 2016-07-31 SURGICAL SUPPLY — 40 items
BANDAGE ACE 4X5 VEL STRL LF (GAUZE/BANDAGES/DRESSINGS) IMPLANT
BANDAGE ELASTIC 4 VELCRO ST LF (GAUZE/BANDAGES/DRESSINGS) ×3 IMPLANT
BLADE SURG 15 STRL LF DISP TIS (BLADE) IMPLANT
BLADE SURG 15 STRL SS (BLADE)
BNDG GAUZE ELAST 4 BULKY (GAUZE/BANDAGES/DRESSINGS) ×3 IMPLANT
CHLORAPREP W/TINT 26ML (MISCELLANEOUS) IMPLANT
COVER SURGICAL LIGHT HANDLE (MISCELLANEOUS) ×3 IMPLANT
CUFF TOURNIQUET SINGLE 18IN (TOURNIQUET CUFF) ×3 IMPLANT
CUFF TOURNIQUET SINGLE 24IN (TOURNIQUET CUFF) IMPLANT
DRSG PAD ABDOMINAL 8X10 ST (GAUZE/BANDAGES/DRESSINGS) ×3 IMPLANT
ELECT REM PT RETURN 9FT ADLT (ELECTROSURGICAL)
ELECTRODE REM PT RTRN 9FT ADLT (ELECTROSURGICAL) IMPLANT
GAUZE SPONGE 4X4 12PLY STRL (GAUZE/BANDAGES/DRESSINGS) ×3 IMPLANT
GAUZE XEROFORM 1X8 LF (GAUZE/BANDAGES/DRESSINGS) ×3 IMPLANT
GLOVE BIO SURGEON STRL SZ8 (GLOVE) ×3 IMPLANT
GLOVE BIOGEL PI IND STRL 8 (GLOVE) ×1 IMPLANT
GLOVE BIOGEL PI INDICATOR 8 (GLOVE) ×2
GOWN STRL REUS W/ TWL LRG LVL3 (GOWN DISPOSABLE) ×2 IMPLANT
GOWN STRL REUS W/TWL LRG LVL3 (GOWN DISPOSABLE) ×4
KIT BASIN OR (CUSTOM PROCEDURE TRAY) ×3 IMPLANT
KIT ROOM TURNOVER OR (KITS) ×3 IMPLANT
NEEDLE 27GAX1X1/2 (NEEDLE) ×3 IMPLANT
NS IRRIG 1000ML POUR BTL (IV SOLUTION) ×3 IMPLANT
PACK ORTHO EXTREMITY (CUSTOM PROCEDURE TRAY) ×3 IMPLANT
PAD ABD 8X10 STRL (GAUZE/BANDAGES/DRESSINGS) ×3 IMPLANT
PAD ARMBOARD 7.5X6 YLW CONV (MISCELLANEOUS) ×6 IMPLANT
PAD CAST 4YDX4 CTTN HI CHSV (CAST SUPPLIES) ×1 IMPLANT
PADDING CAST COTTON 4X4 STRL (CAST SUPPLIES) ×2
SCRUB BETADINE 4OZ XXX (MISCELLANEOUS) ×3 IMPLANT
SOLUTION BETADINE 4OZ (MISCELLANEOUS) IMPLANT
SPONGE GAUZE 4X4 12PLY STER LF (GAUZE/BANDAGES/DRESSINGS) ×3 IMPLANT
SUT PROLENE 4 0 PS 2 18 (SUTURE) ×3 IMPLANT
SUT VIC AB 3-0 PS2 18 (SUTURE) ×3 IMPLANT
SUT VICRYL 4-0 PS2 18IN ABS (SUTURE) ×3 IMPLANT
SYR CONTROL 10ML LL (SYRINGE) ×3 IMPLANT
TOWEL OR 17X24 6PK STRL BLUE (TOWEL DISPOSABLE) ×3 IMPLANT
TOWEL OR 17X26 10 PK STRL BLUE (TOWEL DISPOSABLE) ×3 IMPLANT
TUBE CONNECTING 12'X1/4 (SUCTIONS) ×1
TUBE CONNECTING 12X1/4 (SUCTIONS) ×2 IMPLANT
YANKAUER SUCT BULB TIP NO VENT (SUCTIONS) IMPLANT
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
## 2016-07-31 NOTE — Progress Notes (Signed)
Pt. Refused cpap. Pt. States he will notify if he changes his mind.

## 2016-07-31 NOTE — Plan of Care (Signed)
Problem: Skin Integrity: Goal: Risk for impaired skin integrity will decrease Outcome: Not Progressing Right second toe with cellulitis  Problem: Tissue Perfusion: Goal: Risk factors for ineffective tissue perfusion will decrease Outcome: Progressing On Heparin drip  Problem: Fluid Volume: Goal: Ability to maintain a balanced intake and output will improve Outcome: Progressing On 1500 ml fluid restrictions  Problem: Bowel/Gastric: Goal: Will not experience complications related to bowel motility Outcome: Progressing No bowel issues reported

## 2016-07-31 NOTE — Anesthesia Procedure Notes (Signed)
Anesthesia Regional Block: Popliteal block   Pre-Anesthetic Checklist: ,, timeout performed, Correct Patient, Correct Site, Correct Laterality, Correct Procedure, Correct Position, site marked, Risks and benefits discussed,  Surgical consent,  Pre-op evaluation,  At surgeon's request and post-op pain management  Laterality: Right  Prep: chloraprep       Needles:  Injection technique: Single-shot  Needle Type: Stimiplex     Needle Length: 10cm  Needle Gauge: 21     Additional Needles:   Procedures: ultrasound guided, nerve stimulator,,,,,,  Motor weakness within 5 minutes.   Nerve Stimulator or Paresthesia:  Response: Plantar flexion/toe flexion, 0.5 mA,   Additional Responses:   Narrative:  Start time: 07/31/2016 1:25 PM End time: 07/31/2016 1:35 PM Injection made incrementally with aspirations every 5 mL.  Performed by: Personally  Anesthesiologist: Nolon Nations  Additional Notes: Nerve located and needle positioned with direct ultrasound guidance. Good perineural spread. Patient tolerated well.

## 2016-07-31 NOTE — Transfer of Care (Signed)
Immediate Anesthesia Transfer of Care Note  Patient: [name]  Procedure(s) Performed: Procedure(s): SECOND DIGIT FOOT TOE AMPUTATION (Right)  Patient Location: PACU  Anesthesia Type:MAC  Level of Consciousness: awake, oriented and patient cooperative  Airway & Oxygen Therapy: Patient Spontanous Breathing and Patient connected to nasal cannula oxygen  Post-op Assessment: Report given to RN, Post -op Vital signs reviewed and stable and Patient moving all extremities  Post vital signs: Reviewed and stable  Last Vitals:  Vitals:   07/30/16 2320 07/31/16 0700  BP: (!) 174/102 (!) 154/101  Pulse: 78 69  Resp: 16   Temp: 36.4 C 36.7 C    Last Pain:  Vitals:   07/31/16 1100  TempSrc:   PainSc: 0-No pain         Complications: No apparent anesthesia complications
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
## 2016-07-31 NOTE — Anesthesia Preprocedure Evaluation (Addendum)
Anesthesia Evaluation  Patient identified by MRN, date of birth, ID band Patient awake    Reviewed: Allergy & Precautions, H&P , NPO status , [name] Chart, lab work & pertinent test results, reviewed documented beta blocker date and time   Airway Mallampati: II  TM Distance: >3 FB Neck ROM: Full    Dental no notable dental hx. (+) Dental Advisory Given   Pulmonary sleep apnea and Continuous Positive Airway Pressure Ventilation , former smoker,    Pulmonary exam normal breath sounds clear to auscultation       Cardiovascular hypertension, Pt. on home beta blockers and Pt. on medications +CHF  Normal cardiovascular exam Rhythm:Regular Rate:Normal     Neuro/Psych    GI/Hepatic PUD,   Endo/Other    Renal/GU      Musculoskeletal  (+) Arthritis ,   Abdominal   Peds  Hematology   Anesthesia Other Findings   Reproductive/Obstetrics                            Anesthesia Physical  Anesthesia Plan  ASA: III  Anesthesia Plan: Regional and MAC   Post-op Pain Management:    Induction:   Airway Management Planned: Nasal Cannula and Natural Airway  Additional Equipment: None  Intra-op Plan:   Post-operative Plan:   Informed Consent: I have reviewed the patients History and Physical, chart, labs and discussed the procedure including the risks, benefits and alternatives for the proposed anesthesia with the patient or authorized representative who has indicated his/her understanding and acceptance.   Dental advisory given  Plan Discussed with: CRNA, Anesthesiologist and Surgeon  Anesthesia Plan Comments:      Anesthesia Quick Evaluation

## 2016-07-31 NOTE — Plan of Care (Signed)
Problem: Pain Managment: Goal: General experience of comfort will improve Outcome: Progressing Medicated once for pain  Problem: Skin Integrity: Goal: Risk for impaired skin integrity will decrease Outcome: Progressing Cellulitis to second right toe  Problem: Tissue Perfusion: Goal: Risk factors for ineffective tissue perfusion will decrease Outcome: Progressing On Heparin drip  Problem: Activity: Goal: Risk for activity intolerance will decrease Outcome: Progressing Independent with activities  Problem: Fluid Volume: Goal: Ability to maintain a balanced intake and output will improve Outcome: Progressing On fluid restrictions  Problem: Bowel/Gastric: Goal: Will not experience complications related to bowel motility Outcome: Progressing Denies bowel issues

## 2016-07-31 NOTE — Interval H&P Note (Signed)
History and Physical Interval Note:  07/31/2016 4:10 PM  [name]  has presented today for surgery, with the diagnosis of OSTEOMYELITIS  The various methods of treatment have been discussed with the patient and family. After consideration of risks, benefits and other options for treatment, the patient has consented to  Procedure(s): SECOND DIGIT FOOT TOE AMPUTATION (Right) as a surgical intervention .  The patient's history has been reviewed, patient examined, no change in status, stable for surgery.  I have reviewed the patient's chart and labs.  Questions were answered to the patient's satisfaction.     Edrick Kins

## 2016-07-31 NOTE — Anesthesia Procedure Notes (Signed)
Procedure Name: MAC Date/Time: 07/31/2016 4:45 PM Performed by: Melina Copa, Nihira Puello R Pre-anesthesia Checklist: Patient identified, Emergency Drugs available, Suction available, Patient being monitored and Timeout performed Patient Re-evaluated:Patient Re-evaluated prior to inductionOxygen Delivery Method: Nasal cannula Ventilation: Nasal airway inserted- appropriate to patient size

## 2016-07-31 NOTE — Progress Notes (Signed)
ANTICOAGULATION CONSULT NOTE - Follow Up Consult  Pharmacy Consult for Heparin  Indication: atrial fibrillation  No Known Allergies  Patient Measurements: Height: 5\' 11"  (180.3 cm) Weight: 214 lb 4.6 oz (97.2 kg) IBW/kg (Calculated) : 75.3  Vital Signs: Temp: 98 F (36.7 C) (03/01 0700) Temp Source: Oral (03/01 0700) BP: 154/101 (03/01 0700) Pulse Rate: 69 (03/01 0700)  Labs:  Recent Labs  07/30/16 1451 07/31/16 0233 07/31/16 0747  HGB 16.1 16.2  --   HCT 46.9 46.4  --   PLT 155 166  --   LABPROT  --  14.0  --   INR  --  1.08  --   HEPARINUNFRC  --  0.12* 0.17*  CREATININE 0.70 0.85  --     Estimated Creatinine Clearance: 105.8 mL/min (by C-G formula based on SCr of 0.85 mg/dL).   Assessment: 66 y/o M on heparin for Afib (PTA apixaban but stopped taking months ago) Last heparin level remained low at 0.17. CBC wnl, no AC PTA.  Goal of Therapy:  Heparin level 0.3-0.7 units/ml Monitor platelets by anticoagulation protocol: Yes   Plan:  Increase heparin gtt 1,500 units/hr Monitor daily heparin level, CBC, s/s of bleed F/U s/p OR  Elenor Quinones, PharmD, BCPS Clinical Pharmacist Pager [phone] 07/31/2016 8:51 AM

## 2016-07-31 NOTE — Progress Notes (Signed)
ANTICOAGULATION CONSULT NOTE - Follow Up Consult  Pharmacy Consult for Heparin  Indication: atrial fibrillation  No Known Allergies  Patient Measurements: Height: 5\' 11"  (180.3 cm) Weight: 214 lb 4.6 oz (97.2 kg) IBW/kg (Calculated) : 75.3  Vital Signs: Temp: 98 F (36.7 C) (03/01 2007) Temp Source: Oral (03/01 2007) BP: 158/103 (03/01 2007) Pulse Rate: 69 (03/01 2007)  Labs:  Recent Labs  07/30/16 1451 07/31/16 0233 07/31/16 0747 07/31/16 2034  HGB 16.1 16.2  --   --   HCT 46.9 46.4  --   --   PLT 155 166  --   --   LABPROT  --  14.0  --   --   INR  --  1.08  --   --   HEPARINUNFRC  --  0.12* 0.17* 0.18*  CREATININE 0.70 0.85  --   --     Estimated Creatinine Clearance: 105.8 mL/min (by C-G formula based on SCr of 0.85 mg/dL).   Assessment: 66 y/o M on heparin for Afib (PTA apixaban but stopped taking months ago)  S/p toe amputation this afternoon, Per Dr. [name] heparin infusion was not interrupted during procedure, pt was doing well, ok to continue heparin. Heparin level remains subtherapeutic 0.18 on 1500 units/hr.  Goal of Therapy:  Heparin level 0.3-0.7 units/ml Monitor platelets by anticoagulation protocol: Yes   Plan:  Increase heparin gtt to 1650 units/hr F/u AM labs  Maryanna Shape, PharmD, BCPS  Clinical Pharmacist  Pager: [phone]   07/31/2016 9:25 PM

## 2016-07-31 NOTE — Anesthesia Postprocedure Evaluation (Signed)
Anesthesia Post Note  Patient: [name]  Procedure(s) Performed: Procedure(s) (LRB): SECOND DIGIT FOOT TOE AMPUTATION (Right)  Patient location during evaluation: PACU Anesthesia Type: Regional Level of consciousness: awake and alert and patient cooperative Pain management: pain level controlled Vital Signs Assessment: post-procedure vital signs reviewed and stable Respiratory status: spontaneous breathing and respiratory function stable Cardiovascular status: stable Anesthetic complications: no       Last Vitals:  Vitals:   07/31/16 1737 07/31/16 1753  BP: (!) 154/94 (!) 167/97  Pulse: 61 67  Resp: 15 16  Temp:  36.2 C    Last Pain:  Vitals:   07/31/16 1753  TempSrc: Oral  PainSc:                  Derya Dettmann DAVID

## 2016-07-31 NOTE — Progress Notes (Signed)
ANTICOAGULATION CONSULT NOTE - Follow Up Consult  Pharmacy Consult for Heparin  Indication: atrial fibrillation  No Known Allergies  Patient Measurements: Height: 5\' 11"  (180.3 cm) Weight: 223 lb (101.2 kg) IBW/kg (Calculated) : 75.3  Vital Signs: Temp: 97.5 F (36.4 C) (02/28 2320) Temp Source: Oral (02/28 2320) BP: 174/102 (02/28 2320) Pulse Rate: 78 (02/28 2320)  Labs:  Recent Labs  07/30/16 1451 07/31/16 0233  HGB 16.1 16.2  HCT 46.9 46.4  PLT 155 166  HEPARINUNFRC  --  0.12*  CREATININE 0.70  --     Estimated Creatinine Clearance: 114.6 mL/min (by C-G formula based on SCr of 0.7 mg/dL).   Assessment: 66 y/o M on heparin for afib, on apixaban in the past but had stopped taking it  3/1 AM: heparin level of 0.12 drawn only 2.5 hours after heparin was started (got started late)  Goal of Therapy:  Heparin level 0.3-0.7 units/ml Monitor platelets by anticoagulation protocol: Yes   Plan:  -Cont heparin at 1300 units/hr for now -Re-check HL at 0800  Narda Bonds 07/31/2016,3:26 AM

## 2016-08-01 ENCOUNTER — Encounter (HOSPITAL_COMMUNITY): Payer: Self-pay | Admitting: Podiatry

## 2016-08-01 ENCOUNTER — Inpatient Hospital Stay (HOSPITAL_COMMUNITY): Payer: 59

## 2016-08-01 LAB — CBC
HCT: 43.6 % (ref 39.0–52.0)
HEMOGLOBIN: 14.8 g/dL (ref 13.0–17.0)
MCH: 29.6 pg (ref 26.0–34.0)
MCHC: 33.9 g/dL (ref 30.0–36.0)
MCV: 87.2 fL (ref 78.0–100.0)
Platelets: 158 10*3/uL (ref 150–400)
RBC: 5 MIL/uL (ref 4.22–5.81)
RDW: 13.4 % (ref 11.5–15.5)
WBC: 8.8 10*3/uL (ref 4.0–10.5)

## 2016-08-01 LAB — BASIC METABOLIC PANEL
ANION GAP: 10 (ref 5–15)
BUN: 11 mg/dL (ref 6–20)
CO2: 23 mmol/L (ref 22–32)
Calcium: 8.6 mg/dL — ABNORMAL LOW (ref 8.9–10.3)
Chloride: 106 mmol/L (ref 101–111)
Creatinine, Ser: 0.77 mg/dL (ref 0.61–1.24)
GLUCOSE: 127 mg/dL — AB (ref 65–99)
POTASSIUM: 3.7 mmol/L (ref 3.5–5.1)
SODIUM: 139 mmol/L (ref 135–145)

## 2016-08-01 LAB — HEPARIN LEVEL (UNFRACTIONATED)
HEPARIN UNFRACTIONATED: 0.19 [IU]/mL — AB (ref 0.30–0.70)
HEPARIN UNFRACTIONATED: 0.4 [IU]/mL (ref 0.30–0.70)

## 2016-08-01 MED ORDER — PROMETHAZINE HCL 25 MG/ML IJ SOLN
25.0000 mg | Freq: Once | INTRAMUSCULAR | Status: AC
  Administered 2016-08-01: 25 mg via INTRAVENOUS
  Filled 2016-08-01: qty 1

## 2016-08-01 MED ORDER — ZOLPIDEM TARTRATE 5 MG PO TABS
5.0000 mg | ORAL_TABLET | Freq: Once | ORAL | Status: AC
  Administered 2016-08-01: 5 mg via ORAL
  Filled 2016-08-01: qty 1

## 2016-08-01 MED ORDER — OXYCODONE-ACETAMINOPHEN 5-325 MG PO TABS
1.0000 | ORAL_TABLET | ORAL | Status: DC | PRN
  Administered 2016-08-02: 1 via ORAL
  Filled 2016-08-01: qty 1

## 2016-08-01 MED ORDER — HYDROMORPHONE HCL 2 MG/ML IJ SOLN
1.0000 mg | INTRAMUSCULAR | Status: DC | PRN
  Administered 2016-08-01 (×2): 1 mg via INTRAVENOUS
  Administered 2016-08-01: 2 mg via INTRAVENOUS
  Filled 2016-08-01 (×3): qty 1

## 2016-08-01 NOTE — Progress Notes (Signed)
ANTICOAGULATION CONSULT NOTE - Follow Up Consult  Pharmacy Consult for Heparin  Indication: atrial fibrillation  No Known Allergies  Patient Measurements: Height: 5\' 11"  (180.3 cm) Weight: 214 lb 4.6 oz (97.2 kg) IBW/kg (Calculated) : 75.3  Vital Signs: Temp: 98.2 F (36.8 C) (03/02 0446) Temp Source: Oral (03/02 0446) BP: 148/79 (03/02 0446) Pulse Rate: 63 (03/02 0446)  Labs:  Recent Labs  07/30/16 1451  07/31/16 0233 07/31/16 0747 07/31/16 2034 08/01/16 0405  HGB 16.1  --  16.2  --   --  14.8  HCT 46.9  --  46.4  --   --  43.6  PLT 155  --  166  --   --  158  LABPROT  --   --  14.0  --   --   --   INR  --   --  1.08  --   --   --   HEPARINUNFRC  --   < > 0.12* 0.17* 0.18* 0.19*  CREATININE 0.70  --  0.85  --   --   --   < > = values in this interval not displayed.  Estimated Creatinine Clearance: 105.8 mL/min (by C-G formula based on SCr of 0.85 mg/dL).   Assessment: 66 y/o M on heparin for afib, on apixaban in the past but had stopped taking it  3/2 AM: heparin level remains sub-therapeutic, no issues per RN.   Goal of Therapy:  Heparin level 0.3-0.7 units/ml Monitor platelets by anticoagulation protocol: Yes   Plan:  -Inc heparin to 1850 units/hr -1400 HL  Narda Bonds 08/01/2016,5:29 AM

## 2016-08-01 NOTE — Progress Notes (Signed)
ANTICOAGULATION CONSULT NOTE - Follow Up Consult  Pharmacy Consult for Heparin  Indication: atrial fibrillation  No Known Allergies  Patient Measurements: Height: 5\' 11"  (180.3 cm) Weight: 214 lb 4.6 oz (97.2 kg) IBW/kg (Calculated) : 75.3  Vital Signs: Temp: 98 F (36.7 C) (03/02 1421) Temp Source: Oral (03/02 1421) BP: 147/94 (03/02 1421) Pulse Rate: 58 (03/02 1421)  Labs:  Recent Labs  07/30/16 1451 07/31/16 0233  07/31/16 2034 08/01/16 0405 08/01/16 1407  HGB 16.1 16.2  --   --  14.8  --   HCT 46.9 46.4  --   --  43.6  --   PLT 155 166  --   --  158  --   LABPROT  --  14.0  --   --   --   --   INR  --  1.08  --   --   --   --   HEPARINUNFRC  --  0.12*  < > 0.18* 0.19* 0.40  CREATININE 0.70 0.85  --   --  0.77  --   < > = values in this interval not displayed.  Estimated Creatinine Clearance: 112.4 mL/min (by C-G formula based on SCr of 0.77 mg/dL).   Assessment: 66 y/o M on heparin for Afib (PTA apixaban but stopped taking months ago)  S/p toe amputation 07/31/16 and heparin resumed.    Heparin level is therapeutic; no bleeding reported.   Goal of Therapy:  Heparin level 0.3-0.7 units/ml Monitor platelets by anticoagulation protocol: Yes    Plan:  Continue heparin gtt at 1850 units/hr Monitor daily heparin level, CBC, s/s of bleed   Satrina Magallanes D. Mina Marble, PharmD, BCPS Pager:  [phone] 08/01/2016, 3:23 PM

## 2016-08-01 NOTE — Progress Notes (Signed)
Orthopedic Tech Progress Note Patient Details:  [name] [birth year] [id number]  Ortho Devices Type of Ortho Device: Postop shoe/boot Ortho Device/Splint Interventions: Application   [name] 08/01/2016, 11:03 AM

## 2016-08-01 NOTE — Plan of Care (Signed)
Problem: Safety: Goal: Ability to remain free from injury will improve Outcome: Progressing Safety precautions maintained  Problem: Pain Managment: Goal: General experience of comfort will improve Outcome: Progressing Denies pain  Problem: Tissue Perfusion: Goal: Risk factors for ineffective tissue perfusion will decrease Outcome: Progressing On Heparin drip, denies S/S of DVT  Problem: Activity: Goal: Risk for activity intolerance will decrease Outcome: Progressing In and out bed independently, tolerates well

## 2016-08-01 NOTE — Brief Op Note (Addendum)
07/30/2016 - 07/31/2016  7:00 PM  PATIENT:  [name]  66 y.o. male  PRE-OPERATIVE DIAGNOSIS:  OSTEOMYELITIS  POST-OPERATIVE DIAGNOSIS:  OSTEOMYELITIS  PROCEDURE:  Procedure(s): SECOND DIGIT FOOT TOE AMPUTATION (Right)  INCISION AND DRAINAGE RIGHT FOOT  SURGEON:  Surgeon(s) and Role:    * Edrick Kins, DPM - Primary  PHYSICIAN ASSISTANT:   ASSISTANTS: none   ANESTHESIA:   local and MAC  EBL:  No intake/output data recorded.  BLOOD ADMINISTERED:none  DRAINS: none   LOCAL MEDICATIONS USED:  OTHER POPLITEAL BLOCK RIGHT LOWER EXTREMITY  SPECIMEN:  Source of Specimen:  TOE SECOND DIGIT RIGHT FOOT. DEEP WOUND CULTURE RIGHT FOOT  DISPOSITION OF SPECIMEN:  PATHOLOGY  COUNTS:  YES  TOURNIQUET:   Total Tourniquet Time Documented: Thigh (Right) - 17 minutes Total: Thigh (Right) - 17 minutes   DICTATION: .Viviann Spare Dictation  PLAN OF CARE: Admit to inpatient   PATIENT DISPOSITION:  PACU - hemodynamically stable.   Delay start of Pharmacological VTE agent (>24hrs) due to surgical blood loss or risk of bleeding: not applicable  Edrick Kins, DPM [facility]  Dr. Edrick Kins, [address]                                        [place], [place] [address]                Office [phone]  Fax [phone]

## 2016-08-01 NOTE — Op Note (Addendum)
07/30/2016 - 07/31/2016  7:00 PM  PATIENT:  [name]  66 y.o. male  PRE-OPERATIVE DIAGNOSIS:  OSTEOMYELITIS  POST-OPERATIVE DIAGNOSIS:  OSTEOMYELITIS  PROCEDURE:  Procedure(s): SECOND DIGIT FOOT TOE AMPUTATION (Right)  INCISION AND DRAINAGE RIGHT FOOT  SURGEON:  Surgeon(s) and Role:    * Edrick Kins, DPM - Primary  PHYSICIAN ASSISTANT:   ASSISTANTS: none   ANESTHESIA:   local and MAC  EBL:  No intake/output data recorded.  BLOOD ADMINISTERED:none  DRAINS: none   LOCAL MEDICATIONS USED:  OTHER POPLITEAL BLOCK RIGHT LOWER EXTREMITY  SPECIMEN:  Source of Specimen:  TOE SECOND DIGIT RIGHT FOOT. 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 The patient has had the wound for several months now and all conservative modalities of been unsuccessful in providing any sort of satisfactory alleviation of symptoms for the patient. Radiographic exam and MRI indicate osteomyelitis. The patient also complains of a painful callus skin lesion noted to the plantar aspect of the second MPJ right foot. The patient states that he was told by another physician and it was a plantar wart, however upon examination the skin lesion is a likely porokeratosis. Patient states that the lesion has been very painful and sore to touch and during ambulation fills like he is walking on a pebble in his shoe. The patient was told benefits as well as possible side effects of the surgery. The patient consented for surgical correction. The patient consent form was reviewed. All patient questions were answered. No guarantees were expressed or implied. The patient and the  surgeon boson the patient consent form with the witness present and placed in the patient's chart. The patient and the surgeon boson the patient consent form with the witness present and placed in the patient's chart.  PROCEDURE IN DETAIL: The patient was brought to the operating room placed the operating table in a supine position at which time an aseptic scrub and drape were performed about the patient's right lower extremity after anesthesia was induced as described above. The right ankle tourniquet was inflated to a pressure of 250 mg mercury and attention was directed to the second digit right foot where procedure #1 commenced.  PROCEDURE #1: AMPUTATION SECOND DIGIT RIGHT FOOT A racquet type of incision was planned and made about the metatarsal phalangeal joint of the second digit right foot. The incision was carried down to the level of bone and joint capsule at which time a capsulotomy was performed to the second metatarsal phalangeal joint. All soft tissue structures and capsular tissue was sharply dissected and the second digit was grasped with a sharp towel clamp and removed in toto and placing sterile specimen container. All extensor and flexor tendons that were visualized within the amputation site were distracted distally and cut as far proximal as could be visualized. Attention was then directed within the incision site to the foot where procedure #2 commenced.  PROCEDURE #2: INCISION AND DRAINAGE RIGHT FOOT Careful blunt dissection was performed within the incision site to ensure that there were no deep tissue abscesses or purulent drainage. He was a minimal amount of purulent drainage noted during blunt dissection. Deep wound cultures were taken at  this time the deep tissues. Copious irrigation was then utilized to irrigate the amputation stump site. All necrotic nonviable tissue was sharply dissected down to healthy bleeding viable tissue. After copious irrigation the amputation site was  then prepared for soft tissue closure. 3-0 Prolene suture was utilized to reapproximate superficial skin edges of the amputation site.  PROCEDURE #3: DEBRIDEMENT OF SKIN LESION RIGHT FOOT Medically necessary excisional debridement of the porokeratosis lesion was performed using a #15 surgical blade. Excisional debridement of all the hyperkeratotic tissue was performed including the deep central core noted to the plantar aspect of the second MPJ.  Dry sterile compressive dressings were then applied to all previously mentioned incision sites about the patient's right lower extremity. The right ankle tourniquet which was used for hemostasis was deflated. All normal neurovascular responses including pink color and warmth returned all the remaining digits of the patient's right foot. The patient was then transferred from the operating room to the recovery room having tolerated the procedure and anesthesia well. All vital signs are stable. After a brief stay in recovery room the patient was readmitted to his room with prescriptions for adequate analgesic care. Verbal as well as written instructions were provided for the patient regarding wound care. The patient is to keep the dressings clean dry and intact until he is to follow-up with the surgeon Dr. Daylene Katayama in the office next week upon discharge. The patient can be minimal weightbearing heel touch only to the right lower extremity in a postoperative shoe.  IMPRESSION: From a surgical standpoint, the patient is okay to be discharged on oral antibiotic therapy. Postsurgically the amputation site appears healthy and viable with expected routine healing.   Edrick Kins, DPM [facility]  Dr. Edrick Kins, [place]                                             [place], [place] [address]                Office [phone]  Fax [phone]
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
## 2016-08-02 LAB — CBC
HEMATOCRIT: 44 % (ref 39.0–52.0)
HEMOGLOBIN: 14.9 g/dL (ref 13.0–17.0)
MCH: 29.3 pg (ref 26.0–34.0)
MCHC: 33.9 g/dL (ref 30.0–36.0)
MCV: 86.6 fL (ref 78.0–100.0)
Platelets: 161 10*3/uL (ref 150–400)
RBC: 5.08 MIL/uL (ref 4.22–5.81)
RDW: 13.2 % (ref 11.5–15.5)
WBC: 9.2 10*3/uL (ref 4.0–10.5)

## 2016-08-02 LAB — BASIC METABOLIC PANEL
ANION GAP: 10 (ref 5–15)
BUN: 7 mg/dL (ref 6–20)
CALCIUM: 9.2 mg/dL (ref 8.9–10.3)
CO2: 28 mmol/L (ref 22–32)
Chloride: 99 mmol/L — ABNORMAL LOW (ref 101–111)
Creatinine, Ser: 0.7 mg/dL (ref 0.61–1.24)
GFR calc Af Amer: >60 mL/min (ref >60)
GFR calc non Af Amer: >60 mL/min (ref >60)
Glucose, Bld: 98 mg/dL (ref 65–99)
POTASSIUM: 3.7 mmol/L (ref 3.5–5.1)
Sodium: 137 mmol/L (ref 135–145)

## 2016-08-02 LAB — HEPARIN LEVEL (UNFRACTIONATED): HEPARIN UNFRACTIONATED: 0.46 [IU]/mL (ref 0.30–0.70)

## 2016-08-02 MED ORDER — CLINDAMYCIN HCL 300 MG PO CAPS: 300.0000 mg | ORAL_CAPSULE | Freq: Three times a day (TID) | ORAL | 0 refills | Status: DC

## 2016-08-02 MED ORDER — APIXABAN 5 MG PO TABS
5.0000 mg | ORAL_TABLET | Freq: Two times a day (BID) | ORAL | Status: DC
  Administered 2016-08-02: 5 mg via ORAL
  Filled 2016-08-02: qty 1

## 2016-08-02 MED ORDER — APIXABAN 5 MG PO TABS: 5.0000 mg | ORAL_TABLET | Freq: Two times a day (BID) | ORAL | 0 refills | Status: DC

## 2016-08-02 MED ORDER — BISACODYL 5 MG PO TBEC: 5.0000 mg | DELAYED_RELEASE_TABLET | Freq: Every day | ORAL | 0 refills | Status: DC | PRN

## 2016-08-02 MED ORDER — CLINDAMYCIN HCL 300 MG PO CAPS: 300.0000 mg | ORAL_CAPSULE | Freq: Three times a day (TID) | ORAL | Status: DC

## 2016-08-02 MED ORDER — OXYCODONE-ACETAMINOPHEN 5-325 MG PO TABS: 1.0000 | ORAL_TABLET | ORAL | 0 refills | Status: DC | PRN
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
## 2016-08-02 NOTE — Discharge Instructions (Signed)
Bone and Joint Infections, Adult Bone infections (osteomyelitis) and joint infections (septic arthritis) occur when bacteria or other germs get inside a bone or a joint. This can happen if you have an infection in another part of your body that spreads through your blood. Germs from your skin or from outside of your body can also cause this type of infection if you have a wound or a broken bone (fracture) that breaks the skin. Anyone can get a bone infection or joint infection. You may be more likely to get this type of infection if you have a condition, such as diabetes, that lowers your ability to fight infection or increases your chances of getting an infection. Bone and joint infections can cause damage, and they can spread to other areas of your body. They need to be treated quickly. What are the causes? Most bone and joint infections are caused by bacteria. They can also be caused by other germs, such as viruses and funguses. What increases the risk? This condition is more likely to develop in:  People who recently had surgery, especially bone or joint surgery.  People who have a long-term (chronic) disease, such as:  HIV (human immunodeficiency virus).  Diabetes.  Rheumatoid arthritis.  Sickle cell anemia.  Elderly people.  People who take medicines that block or weaken the bodys defense system (immune system).  People who have a condition that reduces their blood flow.  People who are on kidney dialysis.  People who have an artificial joint.  People who have had a joint or bone repaired with plates or screws (surgical hardware).  People who use or abuse IV drugs.  People who have had trauma, such as stepping on a nail. What are the signs or symptoms? Symptoms vary depending on the type and location of your infection. Common symptoms of bone and joint infections include:  Fever and chills.  Redness and warmth.  Swelling.  Pain and stiffness.  Drainage of fluid or  pus near the infection.  Weight loss and fatigue.  Decreased ability to use a hand or foot. How is this diagnosed? This condition may be diagnosed based on symptoms, medical history, a physical exam, and diagnostic tests. Tests can help to identify the cause of the infection. You may have various tests, such as:  A sample of tissue, fluid, or blood taken to be examined under a microscope.  A procedure to remove fluid from the infected joint with a needle (joint aspiration) for testing in a lab.  Pus or discharge swabbed from a wound for testing to identify germs and to determine what type of medicine will kill them (culture and sensitivity).  Blood tests to look for evidence of infection and inflammation (biomarkers).  Imaging studies to determine how severe the bone or joint infection is. These may include:  X-rays.  CT scan.  MRI.  Bone scan. How is this treated? Treatment depends on the cause and type of infection. Antibiotic medicines are usually the first treatment for a bone or joint infection. Treatment with antibiotics may include:  Getting IV antibiotics. This may be done in a hospital at first. You may have to continue IV antibiotics at home for several weeks. You may also have to take antibiotics by mouth for several weeks after that.  Taking more than one kind of antibiotic. Treatment may start with a type of antibiotic that works against many different bacteria (broad spectrumantibiotics). IV antibiotics may be changed if tests show that another type may work  better. Other treatments may include:  Draining fluid from the joint by placing a needle into it (aspiration).  Surgery to remove:  Dead or dying tissue from a bone or joint.  An infected artificial joint.  Infected plates or screws that were used to repair a broken bone. Follow these instructions at home:  Take medicines only as directed by your health care provider.  Take your antibiotic medicine as  directed by your health care provider. Finish the antibiotic even if you start to feel better.  Follow instructions from your health care provider about how to take IV antibiotics at home.  Ask your health care provider if you have any restrictions on your activities.  Keep all follow-up visits as directed by your health care provider. This is important. Contact a health care provider if:  You have a fever or chills.  You have redness, warmth, pain, or swelling that returns after treatment. Get help right away if:  You have rapid breathing or you have trouble breathing.  You have chest pain.  You cannot drink fluids or make urine.  The affected arm or leg swells, changes color, or turns blue. This information is not intended to replace advice given to you by your health care provider. Make sure you discuss any questions you have with your health care provider. Document Released: 05/19/2005 Document Revised: 10/25/2015 Document Reviewed: 05/17/2014 Elsevier Interactive Patient Education  2017 [employer].

## 2016-08-02 NOTE — Progress Notes (Signed)
ANTICOAGULATION CONSULT NOTE - Follow Up Consult  Pharmacy Consult for Heparin  Indication: atrial fibrillation  No Known Allergies  Patient Measurements: Height: 5\' 11"  (180.3 cm) Weight: 214 lb 4.6 oz (97.2 kg) IBW/kg (Calculated) : 75.3  Vital Signs: Temp: 97.7 F (36.5 C) (03/03 0626) Temp Source: Oral (03/03 0626) BP: 128/69 (03/03 0626) Pulse Rate: 61 (03/03 0626)  Labs:  Recent Labs  07/31/16 0233  08/01/16 0405 08/01/16 1407 08/02/16 0311  HGB 16.2  --  14.8  --  14.9  HCT 46.4  --  43.6  --  44.0  PLT 166  --  158  --  161  LABPROT 14.0  --   --   --   --   INR 1.08  --   --   --   --   HEPARINUNFRC 0.12*  < > 0.19* 0.40 0.46  CREATININE 0.85  --  0.77  --  0.70  < > = values in this interval not displayed.  Estimated Creatinine Clearance: 112.4 mL/min (by C-G formula based on SCr of 0.7 mg/dL).   Assessment: 66 y/o M on heparin for Afib, CHADsVASc = 3 (PTA apixaban but stopped taking months ago) - HL remains therapeutic at 0.46. CBC stable, no s/s of bleed.  Goal of Therapy:  Heparin level 0.3-0.7 units/ml Monitor platelets by anticoagulation protocol: Yes   Plan:  Continue heparin gtt at 1850 units/hr Monitor daily heparin level, CBC, s/s of bleed F/U need to restart PTA Eliquis  Elenor Quinones, PharmD, BCPS Clinical Pharmacist Pager [phone] 08/02/2016 8:10 AM

## 2016-08-04 ENCOUNTER — Ambulatory Visit (HOSPITAL_COMMUNITY): Admission: RE | Admit: 2016-08-04 | Payer: 59 | Source: Ambulatory Visit

## 2016-08-04 LAB — CULTURE, BLOOD (ROUTINE X 2)
CULTURE: NO GROWTH
Culture: NO GROWTH

## 2016-08-05 LAB — AEROBIC/ANAEROBIC CULTURE W GRAM STAIN (SURGICAL/DEEP WOUND)

## 2016-08-05 LAB — AEROBIC/ANAEROBIC CULTURE (SURGICAL/DEEP WOUND): GRAM STAIN: NONE SEEN

## 2016-08-07 ENCOUNTER — Other Ambulatory Visit: Payer: Self-pay | Admitting: Internal Medicine

## 2016-08-08 ENCOUNTER — Other Ambulatory Visit: Payer: Self-pay | Admitting: Interventional Cardiology

## 2016-08-08 MED ORDER — FUROSEMIDE 40 MG PO TABS: 40.0000 mg | ORAL_TABLET | Freq: Two times a day (BID) | ORAL | 0 refills | Status: DC

## 2016-08-11 ENCOUNTER — Ambulatory Visit: Payer: Self-pay | Admitting: Podiatry

## 2016-08-11 ENCOUNTER — Ambulatory Visit: Payer: 59 | Admitting: Internal Medicine

## 2016-08-13 ENCOUNTER — Ambulatory Visit (INDEPENDENT_AMBULATORY_CARE_PROVIDER_SITE_OTHER): Payer: 59 | Admitting: Podiatry

## 2016-08-13 ENCOUNTER — Encounter: Payer: Self-pay | Admitting: Podiatry

## 2016-08-13 DIAGNOSIS — Z9889 Other specified postprocedural states: Secondary | ICD-10-CM

## 2016-08-13 MED ORDER — OXYCODONE-ACETAMINOPHEN 5-325 MG PO TABS: 1.0000 | ORAL_TABLET | Freq: Three times a day (TID) | ORAL | 0 refills | Status: DC | PRN

## 2016-08-13 NOTE — Progress Notes (Signed)
Subjective: Patient presents today status post second toe amputation right foot. Date of surgery 07/31/2016. Patient states he is doing very well. Patient has a moderate amount of pain and tenderness noted to the amputation site.  Objective: Skin incisions are well coapted with sutures intact. No sign of infectious process noted. There is a minimal amount of edema noted localized around the amputation site. There is no drainage noted. Hammertoe deformity also noted to the second digit left foot.  Assessment: Status post second digit amputation right foot. Date of surgery 07/31/2016. Hammertoe second digit left foot  Plan of care: Today sutures were removed. Antibiotic ointment and Band-Aid was applied to the amputation site. Foam pads were provided for the patient for his hammertoe left foot. Return to clinic in 2 weeks. Patient can begin getting the foot wet. Continue postoperative shoe.

## 2016-08-27 ENCOUNTER — Ambulatory Visit (INDEPENDENT_AMBULATORY_CARE_PROVIDER_SITE_OTHER): Payer: 59 | Admitting: Podiatry

## 2016-08-27 ENCOUNTER — Ambulatory Visit (INDEPENDENT_AMBULATORY_CARE_PROVIDER_SITE_OTHER): Payer: 59

## 2016-08-27 DIAGNOSIS — Z9889 Other specified postprocedural states: Secondary | ICD-10-CM | POA: Diagnosis not present

## 2016-08-27 DIAGNOSIS — R600 Localized edema: Secondary | ICD-10-CM

## 2016-08-30 NOTE — Progress Notes (Signed)
Subjective: Patient presents today status post second toe amputation right foot. Date of surgery 07/31/2016. Patient states he is doing very well. Patient has a moderate amount of pain and tenderness noted to the amputation site. Concerns about swelling and discoloration more significant at the end of the day. Patient has pain at nighttime.  Objective: Skin incisions are well coapted with sutures intact. No sign of infectious process noted. There is a minimal amount of edema noted localized around the amputation site. There is no drainage noted. Hammertoe deformity also noted to the second digit left foot.  Assessment: Status post second digit amputation right foot. Date of surgery 07/31/2016. Hammertoe second digit left foot  Plan of care: Patient was evaluated today. Prescription for anti-inflammatory pain cream through [employer]. Prescription for Bactrim DS due to possible cellulitis of the foot. Compression anklet dispensed. Return to clinic in 1 week  Edrick Kins, DPM [facility]  Dr. Edrick Kins, [address] [place]                                        [place], [place] [address]                Office [phone]  Fax [phone]

## 2016-09-01 ENCOUNTER — Ambulatory Visit: Payer: 59 | Admitting: Podiatry

## 2016-09-03 ENCOUNTER — Ambulatory Visit (INDEPENDENT_AMBULATORY_CARE_PROVIDER_SITE_OTHER): Payer: Self-pay | Admitting: Podiatry

## 2016-09-03 ENCOUNTER — Encounter: Payer: Self-pay | Admitting: Podiatry

## 2016-09-03 DIAGNOSIS — Z9889 Other specified postprocedural states: Secondary | ICD-10-CM

## 2016-09-04 ENCOUNTER — Telehealth: Payer: Self-pay

## 2016-09-04 NOTE — Telephone Encounter (Signed)
Called to find out if pt would like colonoscopy scheduled

## 2016-09-05 NOTE — Progress Notes (Signed)
Subjective: Patient presents today status post second toe amputation right foot. Date of surgery 07/31/2016. Patient states he is doing better. Patient reports he has some swelling around the amputation site. He states he has completed his course of Bactrim.  Objective: Skin incisions are well coapted with sutures intact. No sign of infectious process noted. There is a minimal amount of edema noted localized around the amputation site. There is no drainage noted. Hammertoe deformity also noted to the second digit left foot.  Assessment: Status post second digit amputation right foot. Date of surgery 07/31/2016. Hammertoe second digit left foot  Plan of care: Patient was evaluated today. Prescription for Mobic 15 mg. Pt may return to work on 09/08/16. Return to clinic in 4 weeks  Edrick Kins, DPM [facility]  Dr. Edrick Kins, [address] [place]                                        [place], [place] [address]                Office [phone]  Fax [phone]

## 2016-09-08 ENCOUNTER — Telehealth: Payer: Self-pay | Admitting: *Deleted

## 2016-09-08 NOTE — Telephone Encounter (Addendum)
Pt states he tried the work boot as requested by Dr.Evans, and could get it on but continued to have irration to the amputation site, redness and swelling and feels he needs another week off work. Pt states he feels Dr. [name] will want to see him prior to writing the note. I transferred to schedulers. Pt called again, states tell Dr. [name] pt took his advise and tried the boot on and he has problem when he is walking in the boot and would like to remain out of work until 09/15/2016. Pt states he does not need a note for work and would get one if Dr. [name] wanted to see him, to release back to work on 09/15/2016.

## 2016-09-09 NOTE — Telephone Encounter (Signed)
Nope, any note the patient requests is perfectly fine with me. No need to come in.  Thanks, Dr. [name]

## 2016-10-01 ENCOUNTER — Ambulatory Visit: Payer: 59 | Admitting: Podiatry

## 2016-10-08 ENCOUNTER — Telehealth: Payer: Self-pay | Admitting: *Deleted

## 2016-10-08 ENCOUNTER — Ambulatory Visit (INDEPENDENT_AMBULATORY_CARE_PROVIDER_SITE_OTHER): Payer: 59

## 2016-10-08 ENCOUNTER — Other Ambulatory Visit: Payer: Self-pay | Admitting: Podiatry

## 2016-10-08 ENCOUNTER — Encounter: Payer: Self-pay | Admitting: Podiatry

## 2016-10-08 ENCOUNTER — Ambulatory Visit (INDEPENDENT_AMBULATORY_CARE_PROVIDER_SITE_OTHER): Payer: Self-pay | Admitting: Podiatry

## 2016-10-08 DIAGNOSIS — L97529 Non-pressure chronic ulcer of other part of left foot with unspecified severity: Principal | ICD-10-CM

## 2016-10-08 DIAGNOSIS — Z9889 Other specified postprocedural states: Secondary | ICD-10-CM | POA: Diagnosis not present

## 2016-10-08 DIAGNOSIS — I872 Venous insufficiency (chronic) (peripheral): Secondary | ICD-10-CM

## 2016-10-08 DIAGNOSIS — I83025 Varicose veins of left lower extremity with ulcer other part of foot: Secondary | ICD-10-CM

## 2016-10-08 NOTE — Progress Notes (Signed)
Subjective: Patient presents today status post second toe amputation right foot. Date of surgery 07/31/2016. Patient is doing much better today. He is returned to work full duty with no restrictions. Patient denies significant pain. Patient says that there is still some swelling relating to his right foot.  Objective: Skin incisions are well coapted. No sign of infectious process noted. There is a minimal amount of edema noted localized to the dorsum of the right foot.  Bilateral lower extremity varicosities noted.  Assessment: Status post second digit amputation right foot. Date of surgery 07/31/2016. 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 Patient is able to continue working full duty no restrictions. Recommend below-knee compression socks. Today were going to place a referral for Dr. Damita Dunnings, vascular for lower extremity varicosities. Return to clinic when necessary  [name], DPM [facility]  Dr. Edrick Mathis, [address]                                        [place], [place] [address]                Office [phone]  Fax [phone]

## 2016-10-08 NOTE — Telephone Encounter (Addendum)
-----   Message from Edrick Kins, DPM sent at 10/08/2016  9:28 AM EDT ----- Regarding: Vascular referral - Dr. Corrie Mckusick Please provide referral to Dr. Damita Dunnings, vascular, for bilateral lower extremity varicosities and venous insufficiency. Thanks, Dr. [name]. Informed [facility] Dr. [name] was referring pt to Dr. Rolla Plate. Faxed referral, clinicals and demographics.10/30/2016-[facility] states pt "No Showed" for today's 10:00am appt, and did not return any of their calls. I told Mallie Mussel I would contact pt. Left message at pt's home phone to call concerning an appt. I spoke to [employer] - pt's work HR and she states pt has not worked there in several years.

## 2016-10-16 ENCOUNTER — Encounter: Payer: Self-pay | Admitting: Internal Medicine

## 2016-10-17 ENCOUNTER — Encounter: Payer: Self-pay | Admitting: Internal Medicine

## 2016-10-20 ENCOUNTER — Encounter: Payer: Self-pay | Admitting: Internal Medicine

## 2016-10-30 ENCOUNTER — Other Ambulatory Visit: Payer: 59

## 2016-11-28 ENCOUNTER — Other Ambulatory Visit: Payer: Self-pay | Admitting: Interventional Cardiology

## 2016-11-28 NOTE — Telephone Encounter (Signed)
Please advise on refill requests as patient has not been seen in over two years. Thanks, MI

## 2016-12-01 NOTE — Telephone Encounter (Signed)
Eliquis refilled for 30 days by Drue Novel, RN; pt has not been seen Dr. Irish Lack since March 2016.

## 2016-12-17 ENCOUNTER — Telehealth: Payer: Self-pay | Admitting: Interventional Cardiology

## 2016-12-17 NOTE — Telephone Encounter (Signed)
New message    Pt is calling asking for a call back. He said it is about his eliquis

## 2016-12-17 NOTE — Telephone Encounter (Addendum)
Pt calling in needing his cardiac medications re-filled, but b/c he is overdue for f/u they have not been re-filled.  He is currently out all his medications. Advised pt to call and schedule OV for Oct (first openings for [facility]) by the end of the week. He does not his schedule out that far, so he will find out and call back by end of week. He understands I will forward to [place] for approval to refill medications to get him through until he follows up in several months.  [Prescriptions to be sent to [facility]  (not neighborhood store)]

## 2016-12-19 MED ORDER — LISINOPRIL 5 MG PO TABS: 5.0000 mg | ORAL_TABLET | Freq: Every day | ORAL | 0 refills | Status: DC

## 2016-12-19 MED ORDER — APIXABAN 5 MG PO TABS: 5.0000 mg | ORAL_TABLET | Freq: Two times a day (BID) | ORAL | 0 refills | Status: DC

## 2016-12-19 MED ORDER — METOPROLOL TARTRATE 50 MG PO TABS: 50.0000 mg | ORAL_TABLET | Freq: Two times a day (BID) | ORAL | 0 refills | Status: DC

## 2016-12-19 MED ORDER — AMIODARONE HCL 200 MG PO TABS: 200.0000 mg | ORAL_TABLET | Freq: Two times a day (BID) | ORAL | 0 refills | Status: DC

## 2016-12-19 MED ORDER — FUROSEMIDE 40 MG PO TABS: 40.0000 mg | ORAL_TABLET | Freq: Two times a day (BID) | ORAL | 0 refills | Status: DC

## 2016-12-19 MED ORDER — ATORVASTATIN CALCIUM 40 MG PO TABS: 40.0000 mg | ORAL_TABLET | Freq: Every day | ORAL | 0 refills | Status: DC

## 2016-12-19 NOTE — Telephone Encounter (Signed)
Patient works out of state and has difficulty making appointments. Patient states that he has been compliant with his medications, his wife picks them up and gets them to him. He is currently out of all of his cardiac meds and requesting refills. Patient has recent labs on file. Dr. Irish Lack is okay with refilling patient's cardiac meds. Rx with 30 days supply sent in for all cardiac meds to [facility] in [place], [place]. Appointment made for patient to be seen by Dr. Irish Lack on 01/15/17 at 3:00PM as this is the only time that he will be in town to come.

## 2016-12-19 NOTE — Telephone Encounter (Signed)
Left message for patient to call back  

## 2017-01-15 ENCOUNTER — Ambulatory Visit (INDEPENDENT_AMBULATORY_CARE_PROVIDER_SITE_OTHER): Payer: 59 | Admitting: Interventional Cardiology

## 2017-01-15 ENCOUNTER — Encounter: Payer: Self-pay | Admitting: Interventional Cardiology

## 2017-01-15 ENCOUNTER — Encounter (INDEPENDENT_AMBULATORY_CARE_PROVIDER_SITE_OTHER): Payer: Self-pay

## 2017-01-15 VITALS — BP 160/90 | HR 64 | Ht 71.0 in | Wt 218.6 lb

## 2017-01-15 DIAGNOSIS — I428 Other cardiomyopathies: Secondary | ICD-10-CM

## 2017-01-15 DIAGNOSIS — E039 Hypothyroidism, unspecified: Secondary | ICD-10-CM

## 2017-01-15 DIAGNOSIS — I482 Chronic atrial fibrillation, unspecified: Secondary | ICD-10-CM

## 2017-01-15 DIAGNOSIS — I5022 Chronic systolic (congestive) heart failure: Secondary | ICD-10-CM

## 2017-01-15 MED ORDER — METOPROLOL TARTRATE 100 MG PO TABS: 100.0000 mg | ORAL_TABLET | Freq: Two times a day (BID) | ORAL | 3 refills | Status: DC

## 2017-01-15 MED ORDER — ATORVASTATIN CALCIUM 40 MG PO TABS: 40.0000 mg | ORAL_TABLET | Freq: Every day | ORAL | 3 refills | Status: DC

## 2017-01-15 MED ORDER — FUROSEMIDE 40 MG PO TABS: 40.0000 mg | ORAL_TABLET | Freq: Two times a day (BID) | ORAL | 3 refills | Status: DC

## 2017-01-15 MED ORDER — LISINOPRIL 5 MG PO TABS: 5.0000 mg | ORAL_TABLET | Freq: Every day | ORAL | 3 refills | Status: DC

## 2017-01-15 MED ORDER — APIXABAN 5 MG PO TABS: 5.0000 mg | ORAL_TABLET | Freq: Two times a day (BID) | ORAL | 3 refills | Status: DC

## 2017-01-15 NOTE — Patient Instructions (Signed)
Medication Instructions:  Your physician has recommended you make the following change in your medication:   1. STOP taking amiodarone  2. Once you finish your current supply of the metoprolol 50 mg, INCREASE metoprolol to 100 mg twice daily   Labwork: LABS TODAY: CMET, CBC  Your physician recommends that you return for lab work (TSH) on the same day as your echocardiogram   Testing/Procedures: Your physician has requested that you have an echocardiogram. Echocardiography is a painless test that uses sound waves to create images of your heart. It provides your doctor with information about the size and shape of your heart and how well your heart's chambers and valves are working. This procedure takes approximately one hour. There are no restrictions for this procedure.   Follow-Up: Your physician wants you to follow-up in: 1 year with Dr. Irish Lack. You will receive a reminder letter in the mail two months in advance. If you don't receive a letter, please call our office to schedule the follow-up appointment.   Any Other Special Instructions Will Be Listed Below (If Applicable).  You can let your primary care doctor know that we have stopped your amiodarone and that we will be checking labs (TSH) when you come back for your echocardiogram, in case they need to make changes to your thyroid medication   If you need a refill on your cardiac medications before your next appointment, please call your pharmacy.
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
## 2017-01-16 DIAGNOSIS — E039 Hypothyroidism, unspecified: Secondary | ICD-10-CM | POA: Insufficient documentation

## 2017-01-16 LAB — COMPREHENSIVE METABOLIC PANEL
A/G RATIO: 1.7 (ref 1.2–2.2)
ALT: 23 IU/L (ref 0–44)
AST: 20 IU/L (ref 0–40)
Albumin: 4.2 g/dL (ref 3.6–4.8)
Alkaline Phosphatase: 95 IU/L (ref 39–117)
BILIRUBIN TOTAL: 0.4 mg/dL (ref 0.0–1.2)
BUN/Creatinine Ratio: 18 (ref 10–24)
BUN: 14 mg/dL (ref 8–27)
CHLORIDE: 109 mmol/L — AB (ref 96–106)
CO2: 23 mmol/L (ref 20–29)
Calcium: 9.2 mg/dL (ref 8.6–10.2)
Creatinine, Ser: 0.8 mg/dL (ref 0.76–1.27)
GFR calc Af Amer: 110 mL/min/{1.73_m2} (ref >59)
GFR calc non Af Amer: 95 mL/min/{1.73_m2} (ref >59)
GLUCOSE: 92 mg/dL (ref 65–99)
Globulin, Total: 2.5 g/dL (ref 1.5–4.5)
POTASSIUM: 3.9 mmol/L (ref 3.5–5.2)
Sodium: 146 mmol/L — ABNORMAL HIGH (ref 134–144)
Total Protein: 6.7 g/dL (ref 6.0–8.5)

## 2017-01-16 LAB — CBC
HEMATOCRIT: 45.2 % (ref 37.5–51.0)
Hemoglobin: 15.4 g/dL (ref 13.0–17.7)
MCH: 30.3 pg (ref 26.6–33.0)
MCHC: 34.1 g/dL (ref 31.5–35.7)
MCV: 89 fL (ref 79–97)
PLATELETS: 135 10*3/uL — AB (ref 150–379)
RBC: 5.09 x10E6/uL (ref 4.14–5.80)
RDW: 13.6 % (ref 12.3–15.4)
WBC: 9 10*3/uL (ref 3.4–10.8)

## 2017-01-22 ENCOUNTER — Other Ambulatory Visit: Payer: 59 | Admitting: *Deleted

## 2017-01-22 ENCOUNTER — Ambulatory Visit (HOSPITAL_COMMUNITY): Payer: 59 | Attending: Internal Medicine

## 2017-01-22 ENCOUNTER — Other Ambulatory Visit: Payer: Self-pay

## 2017-01-22 DIAGNOSIS — Z87891 Personal history of nicotine dependence: Secondary | ICD-10-CM | POA: Diagnosis not present

## 2017-01-22 DIAGNOSIS — E785 Hyperlipidemia, unspecified: Secondary | ICD-10-CM | POA: Diagnosis not present

## 2017-01-22 DIAGNOSIS — I482 Chronic atrial fibrillation, unspecified: Secondary | ICD-10-CM

## 2017-01-22 DIAGNOSIS — I509 Heart failure, unspecified: Secondary | ICD-10-CM | POA: Diagnosis not present

## 2017-01-22 DIAGNOSIS — G4733 Obstructive sleep apnea (adult) (pediatric): Secondary | ICD-10-CM | POA: Diagnosis not present

## 2017-01-22 DIAGNOSIS — I428 Other cardiomyopathies: Secondary | ICD-10-CM | POA: Insufficient documentation

## 2017-01-22 DIAGNOSIS — I080 Rheumatic disorders of both mitral and aortic valves: Secondary | ICD-10-CM | POA: Insufficient documentation

## 2017-01-22 DIAGNOSIS — I110 Hypertensive heart disease with heart failure: Secondary | ICD-10-CM | POA: Insufficient documentation

## 2017-01-22 LAB — TSH: TSH: 6.48 u[IU]/mL — ABNORMAL HIGH (ref 0.450–4.500)

## 2017-01-27 ENCOUNTER — Other Ambulatory Visit: Payer: Self-pay | Admitting: Interventional Cardiology

## 2017-01-27 MED ORDER — FUROSEMIDE 40 MG PO TABS: 40.0000 mg | ORAL_TABLET | Freq: Two times a day (BID) | ORAL | 3 refills | Status: DC

## 2017-01-27 MED ORDER — METOPROLOL TARTRATE 100 MG PO TABS: 100.0000 mg | ORAL_TABLET | Freq: Two times a day (BID) | ORAL | 3 refills | Status: DC

## 2017-01-27 MED ORDER — APIXABAN 5 MG PO TABS: 5.0000 mg | ORAL_TABLET | Freq: Two times a day (BID) | ORAL | 3 refills | Status: DC

## 2017-01-27 MED ORDER — LISINOPRIL 5 MG PO TABS: 5.0000 mg | ORAL_TABLET | Freq: Every day | ORAL | 3 refills | Status: DC

## 2017-01-28 ENCOUNTER — Telehealth: Payer: Self-pay

## 2017-01-28 DIAGNOSIS — I482 Chronic atrial fibrillation, unspecified: Secondary | ICD-10-CM

## 2017-01-28 MED ORDER — FUROSEMIDE 40 MG PO TABS: 40.0000 mg | ORAL_TABLET | Freq: Two times a day (BID) | ORAL | 0 refills | Status: DC

## 2017-01-28 MED ORDER — APIXABAN 5 MG PO TABS: 5.0000 mg | ORAL_TABLET | Freq: Two times a day (BID) | ORAL | 0 refills | Status: DC

## 2017-01-28 MED ORDER — LISINOPRIL 5 MG PO TABS: 5.0000 mg | ORAL_TABLET | Freq: Every day | ORAL | 1 refills | Status: DC

## 2017-01-28 MED ORDER — METOPROLOL TARTRATE 100 MG PO TABS: 100.0000 mg | ORAL_TABLET | Freq: Two times a day (BID) | ORAL | 0 refills | Status: DC

## 2017-01-28 MED ORDER — ATORVASTATIN CALCIUM 40 MG PO TABS: 40.0000 mg | ORAL_TABLET | Freq: Every day | ORAL | 0 refills | Status: DC

## 2017-01-28 NOTE — Telephone Encounter (Signed)
Patient made aware of lab and echocardiogram results. Patient verbalizes understanding.  Patient states that he has not been taking synthroid. Advised the patient to stay off it for now and we would recheck his labs in 3 months and see if his thyroid function would normalize off of the amiodarone.  Patient is currently working out of town in [place] and states that he will call back to schedule his lab appointment closer to time when he has his schedule. Labs and repeat echo ordered.

## 2017-01-28 NOTE — Telephone Encounter (Signed)
-----   Message from Jettie Booze, MD sent at 01/22/2017  9:54 AM EDT ----- Improved LV function.  Mild to moderate mitral regurgitation.  Will need to be followed with serial echo.

## 2017-01-28 NOTE — Telephone Encounter (Signed)
-----   Message from Jettie Booze, MD sent at 01/22/2017  6:36 PM EDT ----- Is he taking Synthroid?  I would like to recheck TSH and free T4 in about 3 months.  If he has been off of Synthroid, I would stay off for now.  THyroid function could normalize off of amiodarone.

## 2017-01-29 ENCOUNTER — Telehealth: Payer: Self-pay | Admitting: Interventional Cardiology

## 2017-01-29 NOTE — Telephone Encounter (Signed)
Left detailed message letting patient know that per last OV he was instructed to finish his current supply of metoprolol 50 mg BID and then switch to 100 mg BID. Instructed for patient to call back with any questions.

## 2017-01-29 NOTE — Telephone Encounter (Signed)
Follow up       *STAT* If patient is at the pharmacy, call can be transferred to refill team.   1. Which medications need to be refilled? (please list name of each medication and dose if known)  Atorvastatin 40mg   2. Which pharmacy/location (including street and city if local pharmacy) is medication to be sent to? [facility]  3. Do they need a 30 day or 90 day supply? 90 day.  Pt is at work, if you need him, please leave msg on vm

## 2017-01-29 NOTE — Telephone Encounter (Signed)
New message      Pt c/o medication issue:  1. Name of Medication:  Metoprolol  2. How are you currently taking this medication (dosage and times per day)? 100mg  bid 3. Are you having a reaction (difficulty breathing--STAT)?  no 4. What is your medication issue?  Pt just got this presc.  His last presc was for metoprolol 50mg  bid.  He was not told that the medication was going to be changed.  Is the 100mg  the correct dosage?  Pt is at work, ok to leave message on vm       .

## 2017-02-03 ENCOUNTER — Other Ambulatory Visit: Payer: Self-pay | Admitting: *Deleted

## 2017-02-03 MED ORDER — ATORVASTATIN CALCIUM 40 MG PO TABS: 40.0000 mg | ORAL_TABLET | Freq: Every day | ORAL | 3 refills | Status: DC

## 2017-02-03 NOTE — Telephone Encounter (Signed)
Please advise on refill request as patient does not have a recent lipid panel in epic. Thanks, MI 

## 2017-05-01 ENCOUNTER — Telehealth: Payer: Self-pay | Admitting: Interventional Cardiology

## 2017-05-01 NOTE — Telephone Encounter (Signed)
Left message for patient to call back  

## 2017-05-01 NOTE — Telephone Encounter (Signed)
Pt calling to get an ok on what he can take OTC for arthritis in his hip, told not to take Ibuprofen--pls advise  [phone]

## 2017-05-06 NOTE — Telephone Encounter (Signed)
Left message for patient to call back  

## 2017-05-18 NOTE — Telephone Encounter (Signed)
Left message instructed patient to call back with any questions or concerns.
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
## 2017-09-28 ENCOUNTER — Other Ambulatory Visit: Payer: Self-pay

## 2017-09-28 MED ORDER — ATORVASTATIN CALCIUM 40 MG PO TABS: 40.0000 mg | ORAL_TABLET | Freq: Every day | ORAL | 0 refills | Status: DC

## 2017-09-28 MED ORDER — FUROSEMIDE 40 MG PO TABS: 40.0000 mg | ORAL_TABLET | Freq: Two times a day (BID) | ORAL | 0 refills | Status: DC

## 2017-09-28 MED ORDER — METOPROLOL TARTRATE 100 MG PO TABS: 100.0000 mg | ORAL_TABLET | Freq: Two times a day (BID) | ORAL | 0 refills | Status: DC

## 2017-09-28 MED ORDER — LISINOPRIL 5 MG PO TABS: 5.0000 mg | ORAL_TABLET | Freq: Every day | ORAL | 0 refills | Status: DC

## 2017-09-29 MED ORDER — APIXABAN 5 MG PO TABS: 5.0000 mg | ORAL_TABLET | Freq: Two times a day (BID) | ORAL | 4 refills | Status: DC

## 2017-09-29 NOTE — Telephone Encounter (Signed)
Pt. Is 67 yr old male who saw Dr Irish Lack on 01/15/2017 weight at that time 99.2Kg. SCr noted from 01/15/17 was 0.80. Will refill Eliquis 5mg  BID.

## 2017-12-21 ENCOUNTER — Emergency Department (HOSPITAL_COMMUNITY)
Admission: EM | Admit: 2017-12-21 | Discharge: 2017-12-22 | Disposition: A | Discharge status: Home / Self Care | Payer: Self-pay | Attending: Emergency Medicine | Admitting: Emergency Medicine

## 2017-12-21 ENCOUNTER — Emergency Department (HOSPITAL_COMMUNITY): Payer: Self-pay

## 2017-12-21 ENCOUNTER — Encounter (HOSPITAL_COMMUNITY): Payer: Self-pay | Admitting: Emergency Medicine

## 2017-12-21 DIAGNOSIS — I110 Hypertensive heart disease with heart failure: Secondary | ICD-10-CM | POA: Insufficient documentation

## 2017-12-21 DIAGNOSIS — E039 Hypothyroidism, unspecified: Secondary | ICD-10-CM | POA: Insufficient documentation

## 2017-12-21 DIAGNOSIS — I509 Heart failure, unspecified: Secondary | ICD-10-CM | POA: Insufficient documentation

## 2017-12-21 DIAGNOSIS — Z79899 Other long term (current) drug therapy: Secondary | ICD-10-CM | POA: Insufficient documentation

## 2017-12-21 DIAGNOSIS — Z7901 Long term (current) use of anticoagulants: Secondary | ICD-10-CM | POA: Insufficient documentation

## 2017-12-21 DIAGNOSIS — Z87891 Personal history of nicotine dependence: Secondary | ICD-10-CM | POA: Insufficient documentation

## 2017-12-21 LAB — CBC
HCT: 47.9 % (ref 39.0–52.0)
Hemoglobin: 15.6 g/dL (ref 13.0–17.0)
MCH: 30 pg (ref 26.0–34.0)
MCHC: 32.6 g/dL (ref 30.0–36.0)
MCV: 92.1 fL (ref 78.0–100.0)
PLATELETS: 125 10*3/uL — AB (ref 150–400)
RBC: 5.2 MIL/uL (ref 4.22–5.81)
RDW: 13.5 % (ref 11.5–15.5)
WBC: 7.6 10*3/uL (ref 4.0–10.5)

## 2017-12-21 LAB — BASIC METABOLIC PANEL
Anion gap: 9 (ref 5–15)
BUN: 13 mg/dL (ref 8–23)
CALCIUM: 9 mg/dL (ref 8.9–10.3)
CO2: 24 mmol/L (ref 22–32)
CREATININE: 0.88 mg/dL (ref 0.61–1.24)
Chloride: 109 mmol/L (ref 98–111)
GFR calc non Af Amer: >60 mL/min (ref >60)
Glucose, Bld: 104 mg/dL — ABNORMAL HIGH (ref 70–99)
Potassium: 4.2 mmol/L (ref 3.5–5.1)
SODIUM: 142 mmol/L (ref 135–145)

## 2017-12-21 LAB — I-STAT TROPONIN, ED: TROPONIN I, POC: 0.03 ng/mL (ref 0.00–0.08)

## 2017-12-21 NOTE — ED Triage Notes (Signed)
Pt reports SOB onset this evening, states it is worse when laying flat or with exertion. Denies CP

## 2017-12-22 ENCOUNTER — Other Ambulatory Visit: Payer: Self-pay

## 2017-12-22 LAB — BRAIN NATRIURETIC PEPTIDE: B Natriuretic Peptide: 787.8 pg/mL — ABNORMAL HIGH (ref 0.0–100.0)

## 2017-12-22 MED ORDER — FUROSEMIDE 10 MG/ML IJ SOLN
40.0000 mg | Freq: Once | INTRAMUSCULAR | Status: AC
  Administered 2017-12-22: 40 mg via INTRAVENOUS
  Filled 2017-12-22: qty 4
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
## 2017-12-22 NOTE — ED Notes (Signed)
Pt in room and alert VS documented Call light within reach 

## 2017-12-22 NOTE — Discharge Instructions (Addendum)
Increase your Lasix to 40 mg twice daily for the next 3 days.  Follow-up with your cardiologist in the next week, and return to the ER if you develop chest pain, worsening breathing, or other new and concerning symptoms.

## 2017-12-23 ENCOUNTER — Telehealth: Payer: Self-pay | Admitting: Interventional Cardiology

## 2017-12-23 NOTE — Telephone Encounter (Signed)
Attempted to contact patient after 5:30 PM as requested but there was no answer and VM full  So unable to leave VM.

## 2017-12-23 NOTE — Telephone Encounter (Signed)
New Message    Pt c/o medication issue:  1. Name of Medication: furosemide (LASIX) 40 MG tablet  2. How are you currently taking this medication (dosage and times per day)?   3. Are you having a reaction (difficulty breathing--STAT)?   4. What is your medication issue? Patient is calling because he recently (Monday) went to the ER due to shortness of breath. He states that that ER told him it was because he had fluid buildup so they increased his lasix to three times a day. He wanting to come in and be seen but the 1st available with care team is 09/04. He does not want to wait that long. He said he is typically not reachable by phone until 5:30. Please call.

## 2017-12-25 NOTE — Telephone Encounter (Signed)
Patient calling back and states that he was recently seen in the ER for CHF exacerbation. Patient had not been taking his afternoon lasix. Patient was instructed to take 80 mg in the AM and 40 mg in the PM x 3 days and then resume 40 mg BID. Patient requesting his post hospital/yearly follow-up appointment. Appointment made for 8/9 at 2:20 PM. Instructed patient to let me know if he has SOB, LEE, or weight gain before then. Patient verbalized understanding and thanked me for the call.

## 2017-12-25 NOTE — Telephone Encounter (Signed)
Attempted to contact patient but there was no answer and VM full. Unable to leave VM.

## 2018-01-06 ENCOUNTER — Encounter (HOSPITAL_COMMUNITY): Payer: Self-pay | Admitting: Radiology
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
## 2018-01-08 ENCOUNTER — Ambulatory Visit (INDEPENDENT_AMBULATORY_CARE_PROVIDER_SITE_OTHER): Payer: Self-pay | Admitting: Interventional Cardiology

## 2018-01-08 ENCOUNTER — Encounter: Payer: Self-pay | Admitting: Interventional Cardiology

## 2018-01-08 VITALS — BP 138/82 | HR 52 | Ht 71.0 in | Wt 211.0 lb

## 2018-01-08 DIAGNOSIS — I482 Chronic atrial fibrillation, unspecified: Secondary | ICD-10-CM

## 2018-01-08 DIAGNOSIS — I428 Other cardiomyopathies: Secondary | ICD-10-CM

## 2018-01-08 DIAGNOSIS — E039 Hypothyroidism, unspecified: Secondary | ICD-10-CM

## 2018-01-08 DIAGNOSIS — I5022 Chronic systolic (congestive) heart failure: Secondary | ICD-10-CM

## 2018-01-08 DIAGNOSIS — I25118 Atherosclerotic heart disease of native coronary artery with other forms of angina pectoris: Secondary | ICD-10-CM

## 2018-01-08 NOTE — Patient Instructions (Signed)
Medication Instructions:  Your physician recommends that you continue on your current medications as directed. Please refer to the Current Medication list given to you today.   Labwork: TODAY: CMET, LIPIDS, CBC  Testing/Procedures: None ordered  Follow-Up: Your physician wants you to follow-up in: 6 months with Dr. Irish Lack. You will receive a reminder letter in the mail two months in advance. If you don't receive a letter, please call our office to schedule the follow-up appointment.   Any Other Special Instructions Will Be Listed Below (If Applicable).  Do not take Ibuprofen. You may take acetaminophen.    If you need a refill on your cardiac medications before your next appointment, please call your pharmacy.

## 2018-01-09 LAB — CBC
HEMATOCRIT: 46.9 % (ref 37.5–51.0)
HEMOGLOBIN: 16 g/dL (ref 13.0–17.7)
MCH: 30.3 pg (ref 26.6–33.0)
MCHC: 34.1 g/dL (ref 31.5–35.7)
MCV: 89 fL (ref 79–97)
Platelets: 124 10*3/uL — ABNORMAL LOW (ref 150–450)
RBC: 5.28 x10E6/uL (ref 4.14–5.80)
RDW: 14 % (ref 12.3–15.4)
WBC: 8.3 10*3/uL (ref 3.4–10.8)

## 2018-01-09 LAB — COMPREHENSIVE METABOLIC PANEL
ALBUMIN: 4 g/dL (ref 3.6–4.8)
ALK PHOS: 91 IU/L (ref 39–117)
ALT: 28 IU/L (ref 0–44)
AST: 29 IU/L (ref 0–40)
Albumin/Globulin Ratio: 1.7 (ref 1.2–2.2)
BUN / CREAT RATIO: 19 (ref 10–24)
BUN: 15 mg/dL (ref 8–27)
Bilirubin Total: 0.6 mg/dL (ref 0.0–1.2)
CHLORIDE: 108 mmol/L — AB (ref 96–106)
CO2: 21 mmol/L (ref 20–29)
CREATININE: 0.78 mg/dL (ref 0.76–1.27)
Calcium: 9.1 mg/dL (ref 8.6–10.2)
GFR calc Af Amer: 110 mL/min/{1.73_m2} (ref >59)
GFR calc non Af Amer: 95 mL/min/{1.73_m2} (ref >59)
GLUCOSE: 94 mg/dL (ref 65–99)
Globulin, Total: 2.3 g/dL (ref 1.5–4.5)
Potassium: 3.9 mmol/L (ref 3.5–5.2)
Sodium: 146 mmol/L — ABNORMAL HIGH (ref 134–144)
Total Protein: 6.3 g/dL (ref 6.0–8.5)

## 2018-01-09 LAB — LIPID PANEL
CHOL/HDL RATIO: 3.7 ratio (ref 0.0–5.0)
Cholesterol, Total: 110 mg/dL (ref 100–199)
HDL: 30 mg/dL — AB (ref >39)
LDL Calculated: 45 mg/dL (ref 0–99)
Triglycerides: 174 mg/dL — ABNORMAL HIGH (ref 0–149)
VLDL CHOLESTEROL CAL: 35 mg/dL (ref 5–40)

## 2018-01-22 ENCOUNTER — Other Ambulatory Visit: Payer: Self-pay

## 2018-01-22 ENCOUNTER — Encounter (INDEPENDENT_AMBULATORY_CARE_PROVIDER_SITE_OTHER): Payer: Self-pay

## 2018-01-22 ENCOUNTER — Ambulatory Visit (HOSPITAL_COMMUNITY): Payer: Managed Care, Other (non HMO) | Attending: Cardiology

## 2018-01-22 DIAGNOSIS — I081 Rheumatic disorders of both mitral and tricuspid valves: Secondary | ICD-10-CM | POA: Insufficient documentation

## 2018-01-22 DIAGNOSIS — I482 Chronic atrial fibrillation, unspecified: Secondary | ICD-10-CM

## 2018-02-02 ENCOUNTER — Telehealth: Payer: Self-pay | Admitting: Interventional Cardiology

## 2018-02-02 NOTE — Telephone Encounter (Signed)
Returned call to patient but there was no answer.

## 2018-02-02 NOTE — Telephone Encounter (Signed)
New message:      Pt is returning a call for lab results and he ask if we can please call him on his last break at 3:15 with his lab results

## 2018-02-03 ENCOUNTER — Telehealth: Payer: Self-pay | Admitting: Interventional Cardiology

## 2018-02-03 NOTE — Telephone Encounter (Signed)
New Message:    Patient calling for lab results  Patient request for the call to be between 12-12:30

## 2018-02-03 NOTE — Telephone Encounter (Signed)
Called patient and made patient aware of echo results and recommendations to limit the amount of salt in his diet and to take his lasix, lisinopril, and metoprolol as prescribed. Instructed patient to weigh himself daily at the same time with the same amount of clothes and keep a record. Instructed the patient to let us know if he has >3 lb weight gain in 24 hours or >5 lb weight gain in a week, or increased swelling or SOB. Also reviewed lab results from 01/08/18. Patient received letter in the mail. Patient verbalized understanding to all instruction and thanked me for the call.

## 2018-02-03 NOTE — Telephone Encounter (Signed)
See telephone encounter from 9/4.

## 2018-02-03 NOTE — Telephone Encounter (Signed)
-----   Message from Jettie Booze, MD sent at 01/26/2018  8:24 AM EDT ----- LVEF still reduced.  COntinue to limit salt intake and take diuretics as prescribed.  Daily weights as well to look for fluid overload.

## 2018-03-18 ENCOUNTER — Other Ambulatory Visit: Payer: Self-pay

## 2018-03-18 MED ORDER — APIXABAN 5 MG PO TABS: 5.0000 mg | ORAL_TABLET | Freq: Two times a day (BID) | ORAL | 5 refills | Status: DC

## 2018-03-18 MED ORDER — ATORVASTATIN CALCIUM 40 MG PO TABS: 40.0000 mg | ORAL_TABLET | Freq: Every day | ORAL | 3 refills | Status: DC

## 2018-03-18 MED ORDER — LISINOPRIL 5 MG PO TABS: 5.0000 mg | ORAL_TABLET | Freq: Every day | ORAL | 0 refills | Status: DC

## 2018-03-18 MED ORDER — METOPROLOL TARTRATE 100 MG PO TABS: 100.0000 mg | ORAL_TABLET | Freq: Two times a day (BID) | ORAL | 3 refills | Status: DC

## 2018-03-19 ENCOUNTER — Other Ambulatory Visit: Payer: Self-pay

## 2018-03-19 MED ORDER — ATORVASTATIN CALCIUM 40 MG PO TABS: 40.0000 mg | ORAL_TABLET | Freq: Every day | ORAL | 3 refills | Status: DC

## 2018-03-19 MED ORDER — LISINOPRIL 5 MG PO TABS: 5.0000 mg | ORAL_TABLET | Freq: Every day | ORAL | 0 refills | Status: DC

## 2018-03-19 MED ORDER — METOPROLOL TARTRATE 100 MG PO TABS: 100.0000 mg | ORAL_TABLET | Freq: Two times a day (BID) | ORAL | 0 refills | Status: DC

## 2018-03-22 ENCOUNTER — Other Ambulatory Visit: Payer: Self-pay | Admitting: Interventional Cardiology

## 2018-03-22 MED ORDER — LISINOPRIL 5 MG PO TABS: 5.0000 mg | ORAL_TABLET | Freq: Every day | ORAL | 3 refills | Status: DC

## 2018-03-22 MED ORDER — APIXABAN 5 MG PO TABS: 5.0000 mg | ORAL_TABLET | Freq: Two times a day (BID) | ORAL | 5 refills | Status: DC

## 2018-03-22 MED ORDER — METOPROLOL TARTRATE 100 MG PO TABS: 100.0000 mg | ORAL_TABLET | Freq: Two times a day (BID) | ORAL | 3 refills | Status: DC

## 2018-03-22 MED ORDER — ATORVASTATIN CALCIUM 40 MG PO TABS: 40.0000 mg | ORAL_TABLET | Freq: Every day | ORAL | 3 refills | Status: DC

## 2018-06-15 ENCOUNTER — Emergency Department (HOSPITAL_COMMUNITY)
Admission: EM | Admit: 2018-06-15 | Discharge: 2018-06-15 | Disposition: A | Discharge status: Home / Self Care | Payer: Self-pay | Attending: Emergency Medicine | Admitting: Emergency Medicine

## 2018-06-15 ENCOUNTER — Telehealth: Payer: Self-pay | Admitting: Interventional Cardiology

## 2018-06-15 ENCOUNTER — Emergency Department (HOSPITAL_COMMUNITY): Payer: Self-pay

## 2018-06-15 ENCOUNTER — Encounter (HOSPITAL_COMMUNITY): Payer: Self-pay | Admitting: Emergency Medicine

## 2018-06-15 ENCOUNTER — Other Ambulatory Visit: Payer: Self-pay

## 2018-06-15 DIAGNOSIS — I509 Heart failure, unspecified: Secondary | ICD-10-CM

## 2018-06-15 DIAGNOSIS — E039 Hypothyroidism, unspecified: Secondary | ICD-10-CM | POA: Insufficient documentation

## 2018-06-15 DIAGNOSIS — I5023 Acute on chronic systolic (congestive) heart failure: Secondary | ICD-10-CM | POA: Insufficient documentation

## 2018-06-15 DIAGNOSIS — I110 Hypertensive heart disease with heart failure: Secondary | ICD-10-CM | POA: Insufficient documentation

## 2018-06-15 DIAGNOSIS — Z7901 Long term (current) use of anticoagulants: Secondary | ICD-10-CM | POA: Insufficient documentation

## 2018-06-15 DIAGNOSIS — Z87891 Personal history of nicotine dependence: Secondary | ICD-10-CM | POA: Insufficient documentation

## 2018-06-15 DIAGNOSIS — Z79899 Other long term (current) drug therapy: Secondary | ICD-10-CM | POA: Insufficient documentation

## 2018-06-15 LAB — COMPREHENSIVE METABOLIC PANEL
ALBUMIN: 3.4 g/dL — AB (ref 3.5–5.0)
ALT: 38 U/L (ref 0–44)
AST: 36 U/L (ref 15–41)
Alkaline Phosphatase: 75 U/L (ref 38–126)
Anion gap: 9 (ref 5–15)
BUN: 11 mg/dL (ref 8–23)
CHLORIDE: 107 mmol/L (ref 98–111)
CO2: 24 mmol/L (ref 22–32)
Calcium: 8.8 mg/dL — ABNORMAL LOW (ref 8.9–10.3)
Creatinine, Ser: 0.79 mg/dL (ref 0.61–1.24)
GFR calc Af Amer: >60 mL/min (ref >60)
GLUCOSE: 122 mg/dL — AB (ref 70–99)
POTASSIUM: 3.6 mmol/L (ref 3.5–5.1)
SODIUM: 140 mmol/L (ref 135–145)
Total Bilirubin: 1.5 mg/dL — ABNORMAL HIGH (ref 0.3–1.2)
Total Protein: 6 g/dL — ABNORMAL LOW (ref 6.5–8.1)

## 2018-06-15 LAB — CBC WITH DIFFERENTIAL/PLATELET
ABS IMMATURE GRANULOCYTES: 0.04 10*3/uL (ref 0.00–0.07)
BASOS ABS: 0.1 10*3/uL (ref 0.0–0.1)
BASOS PCT: 1 %
EOS ABS: 0.2 10*3/uL (ref 0.0–0.5)
Eosinophils Relative: 2 %
HCT: 46.1 % (ref 39.0–52.0)
Hemoglobin: 15 g/dL (ref 13.0–17.0)
Immature Granulocytes: 0 %
Lymphocytes Relative: 15 %
Lymphs Abs: 1.4 10*3/uL (ref 0.7–4.0)
MCH: 29.6 pg (ref 26.0–34.0)
MCHC: 32.5 g/dL (ref 30.0–36.0)
MCV: 91.1 fL (ref 80.0–100.0)
Monocytes Absolute: 0.8 10*3/uL (ref 0.1–1.0)
Monocytes Relative: 9 %
NEUTROS ABS: 6.8 10*3/uL (ref 1.7–7.7)
NEUTROS PCT: 73 %
NRBC: 0 % (ref 0.0–0.2)
PLATELETS: 103 10*3/uL — AB (ref 150–400)
RBC: 5.06 MIL/uL (ref 4.22–5.81)
RDW: 13.6 % (ref 11.5–15.5)
WBC: 9.4 10*3/uL (ref 4.0–10.5)

## 2018-06-15 LAB — LIPASE, BLOOD: Lipase: 34 U/L (ref 11–51)

## 2018-06-15 LAB — I-STAT TROPONIN, ED: Troponin i, poc: 0.03 ng/mL (ref 0.00–0.08)

## 2018-06-15 LAB — BRAIN NATRIURETIC PEPTIDE: B Natriuretic Peptide: 864.6 pg/mL — ABNORMAL HIGH (ref 0.0–100.0)

## 2018-06-15 MED ORDER — FUROSEMIDE 10 MG/ML IJ SOLN
40.0000 mg | Freq: Once | INTRAMUSCULAR | Status: AC
  Administered 2018-06-15: 40 mg via INTRAVENOUS
  Filled 2018-06-15: qty 4

## 2018-06-15 NOTE — ED Notes (Signed)
Pt currently denying CP or abdominal pain.

## 2018-06-15 NOTE — Telephone Encounter (Signed)
New Message         Patient is calling today for advice. Patient belongs to Dr. Irish Lack and he went to hospital and now is needing advice on stomach issues that is also pertaining to the heart. He thinks it's something he doing wrong. Pls call and advise.
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
## 2018-06-15 NOTE — ED Notes (Signed)
Discharge instructions (including medications) discussed with and copy provided to patient/caregiver.  Pt verbalizes understanding and make follow-up appointments as instructed.

## 2018-06-15 NOTE — Telephone Encounter (Signed)
Returned call to patient. Patient states that he was in ER today and was given IV lasix for having extra fluid. Patient was discharged and instructed to take  Extra lasix (40 mg BID) for 3 days and schedule follow up with Dr. Irish Lack. Patient requesting an appointment on Friday only. Appointment made for patient on 1/31. Instructed for patient to limit the amount of salt in his diet and to take his lasix as prescribed. Instructed patient to weigh himself daily at the same time with the same amount of clothes and keep a record. Instructed the patient to let us know if he has >3 lb weight gain in 24 hours or >5 lb weight gain in a week, or increased swelling or SOB. Patient verbalized understanding and thanked me for the call.

## 2018-06-15 NOTE — ED Triage Notes (Signed)
Pt here with multiple complaints including abdominal pain and shob x1 week; also reports right foot pain. Pt also states he has A-Fib.

## 2018-06-15 NOTE — Discharge Instructions (Addendum)
You were seen today for shortness of breath and were found to have extra fluid related to congestive heart failure. You were given lasix which helps you urinate and decrease fluids.  Increase your dose of lasix for the next 3 days to twice per day and follow up with Dr. Irish Lack.

## 2018-06-15 NOTE — ED Notes (Signed)
ED Provider at bedside. 
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
## 2018-07-02 ENCOUNTER — Ambulatory Visit: Payer: Self-pay | Admitting: Interventional Cardiology

## 2018-07-02 ENCOUNTER — Encounter: Payer: Self-pay | Admitting: Interventional Cardiology

## 2018-07-02 ENCOUNTER — Other Ambulatory Visit: Payer: Self-pay | Admitting: Interventional Cardiology

## 2018-07-02 VITALS — BP 134/82 | HR 53 | Ht 71.0 in | Wt 207.0 lb

## 2018-07-02 DIAGNOSIS — I428 Other cardiomyopathies: Secondary | ICD-10-CM

## 2018-07-02 DIAGNOSIS — I739 Peripheral vascular disease, unspecified: Secondary | ICD-10-CM

## 2018-07-02 DIAGNOSIS — I5022 Chronic systolic (congestive) heart failure: Secondary | ICD-10-CM

## 2018-07-02 DIAGNOSIS — I25118 Atherosclerotic heart disease of native coronary artery with other forms of angina pectoris: Secondary | ICD-10-CM

## 2018-07-02 DIAGNOSIS — I482 Chronic atrial fibrillation, unspecified: Secondary | ICD-10-CM

## 2018-07-02 MED ORDER — SACUBITRIL-VALSARTAN 24-26 MG PO TABS: 1.0000 | ORAL_TABLET | Freq: Two times a day (BID) | ORAL | 11 refills | Status: DC

## 2018-07-02 NOTE — Patient Instructions (Signed)
Medication Instructions:  Your physician has recommended you make the following change in your medication:   1. STOP: lisinopril  2. Wait 36 hours after your last dose of lisinopril and START: entresto 24-26 mg tablet: Take 1 tablet by mouth twice a day  If you need a refill on your cardiac medications before your next appointment, please call your pharmacy.   Lab work: Your physician recommends that you return for lab work in: 1 week for BMET   If you have labs (blood work) drawn today and your tests are completely normal, you will receive your results only by: Marland Kitchen MyChart Message (if you have MyChart) OR . A paper copy in the mail If you have any lab test that is abnormal or we need to change your treatment, we will call you to review the results.  Testing/Procedures: Your physician has recommended that you wear a holter monitor. Holter monitors are medical devices that record the heart's electrical activity. Doctors most often use these monitors to diagnose arrhythmias. Arrhythmias are problems with the speed or rhythm of the heartbeat. The monitor is a small, portable device. You can wear one while you do your normal daily activities. This is usually used to diagnose what is causing palpitations/syncope (passing out).  Your physician has requested that you have a lower or upper extremity arterial duplex. This test is an ultrasound of the arteries in the legs or arms. It looks at arterial blood flow in the legs and arms. Allow one hour for Lower and Upper Arterial scans. There are no restrictions or special instructions   Follow-Up: . Your physician recommends that you schedule a follow-up appointment in: 3-4 weeks with Dr. Irish Lack .   Any Other Special Instructions Will Be Listed Below (If Applicable).

## 2018-07-05 ENCOUNTER — Telehealth: Payer: Self-pay | Admitting: Interventional Cardiology

## 2018-07-05 NOTE — Telephone Encounter (Signed)
I do not see any notes in the chart that an [place]. Is in process for [facility] for the pt. I will send this to [place], LPN who may be working on this. Jeani Hawking will be back in the office tomorrow Tuesday 07/06/18.

## 2018-07-05 NOTE — Telephone Encounter (Signed)
  Pt c/o medication issue:  1. Name of Medication: Entresto  2. How are you currently taking this medication (dosage and times per day)?   3. Are you having a reaction (difficulty breathing--STAT)?    4. What is your medication issue? [name] would like to talk to the person working on the authorization REF #[id number]

## 2018-07-06 NOTE — Telephone Encounter (Addendum)
I called covermymeds back and s/w Kayla who attempted to help me with the pts Entresto PA. [name] is having the same issues that I am with this PA so she is faxing me a hard copy so I can do the PA on a form and fax to [facility].  Also, [name] gave me the phone number to contact [facility] @ [phone] if I need to.

## 2018-07-07 NOTE — Telephone Encounter (Signed)
Entresto PA Hard copy (form) received from covermymeds. I have completed the PA form, Dr Irish Lack has signed it and I faxed it to [facility].

## 2018-07-08 NOTE — Telephone Encounter (Signed)
Received a fax from [facility] stating they were not insurer coverage for this patient. When reviewing chart I see that [facility] is patients insurance, a prior authorization was completed and faxed to [employer] for [employer].

## 2018-07-09 ENCOUNTER — Telehealth: Payer: Self-pay | Admitting: Interventional Cardiology

## 2018-07-09 NOTE — Telephone Encounter (Signed)
New message    Patient states that he is having cramping in both feet. Please call to discuss.

## 2018-07-09 NOTE — Telephone Encounter (Signed)
Patient calling and states that he is having cramping in both legs and feet with walking. Patient is scheduled for BMET and LE Arterial Doppler next week. Patient states that his SOB has improved since starting the entresto. Made patient aware that I will call him with his results once they are back and let him know if this explains the Sx that he is having. Patient will let us know if his Sx change or worsen.

## 2018-07-16 ENCOUNTER — Ambulatory Visit (HOSPITAL_COMMUNITY)
Admission: RE | Admit: 2018-07-16 | Discharge: 2018-07-16 | Disposition: A | Discharge status: Home / Self Care | Payer: Self-pay | Source: Ambulatory Visit | Attending: Cardiology | Admitting: Cardiology

## 2018-07-16 ENCOUNTER — Ambulatory Visit (INDEPENDENT_AMBULATORY_CARE_PROVIDER_SITE_OTHER): Payer: Self-pay

## 2018-07-16 ENCOUNTER — Other Ambulatory Visit: Payer: Self-pay | Admitting: *Deleted

## 2018-07-16 ENCOUNTER — Encounter (INDEPENDENT_AMBULATORY_CARE_PROVIDER_SITE_OTHER): Payer: Self-pay

## 2018-07-16 DIAGNOSIS — I739 Peripheral vascular disease, unspecified: Secondary | ICD-10-CM | POA: Insufficient documentation

## 2018-07-16 DIAGNOSIS — I5022 Chronic systolic (congestive) heart failure: Secondary | ICD-10-CM

## 2018-07-16 DIAGNOSIS — I482 Chronic atrial fibrillation, unspecified: Secondary | ICD-10-CM

## 2018-07-16 DIAGNOSIS — I428 Other cardiomyopathies: Secondary | ICD-10-CM

## 2018-07-16 LAB — BASIC METABOLIC PANEL
BUN/Creatinine Ratio: 18 (ref 10–24)
BUN: 14 mg/dL (ref 8–27)
CHLORIDE: 103 mmol/L (ref 96–106)
CO2: 22 mmol/L (ref 20–29)
Calcium: 9.5 mg/dL (ref 8.6–10.2)
Creatinine, Ser: 0.78 mg/dL (ref 0.76–1.27)
GFR calc Af Amer: 110 mL/min/{1.73_m2} (ref >59)
GFR calc non Af Amer: 95 mL/min/{1.73_m2} (ref >59)
Glucose: 96 mg/dL (ref 65–99)
Potassium: 3.9 mmol/L (ref 3.5–5.2)
Sodium: 142 mmol/L (ref 134–144)

## 2018-07-20 NOTE — Telephone Encounter (Signed)
**Note De-Identified [name] Obfuscation** Letter received from [facility] stating that they are unable to locate the pt in their records.  I called the pt but got no answer and a message on the pts VM stated that his mail box is full so I cannot leave a message.

## 2018-07-20 NOTE — Telephone Encounter (Signed)
I called [facility] and was advised that they have the same [facility] card that we do. The pharmacist and I verified that the cards have the same [facility] ID # of [id number] on them.  Unable to work on this [employer] PA as I cannot find correct insurance info on the pt.

## 2018-07-23 ENCOUNTER — Encounter: Payer: Self-pay | Admitting: Interventional Cardiology

## 2018-07-23 ENCOUNTER — Telehealth: Payer: Self-pay | Admitting: Interventional Cardiology

## 2018-07-23 ENCOUNTER — Ambulatory Visit: Payer: Self-pay | Admitting: Interventional Cardiology

## 2018-07-23 ENCOUNTER — Ambulatory Visit (INDEPENDENT_AMBULATORY_CARE_PROVIDER_SITE_OTHER): Payer: Self-pay | Admitting: Interventional Cardiology

## 2018-07-23 VITALS — BP 130/84 | HR 58 | Ht 71.0 in | Wt 203.2 lb

## 2018-07-23 DIAGNOSIS — I25118 Atherosclerotic heart disease of native coronary artery with other forms of angina pectoris: Secondary | ICD-10-CM

## 2018-07-23 DIAGNOSIS — I482 Chronic atrial fibrillation, unspecified: Secondary | ICD-10-CM

## 2018-07-23 DIAGNOSIS — I5022 Chronic systolic (congestive) heart failure: Secondary | ICD-10-CM

## 2018-07-23 DIAGNOSIS — Z7901 Long term (current) use of anticoagulants: Secondary | ICD-10-CM
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
## 2018-07-23 NOTE — Telephone Encounter (Signed)
New Message   Patient calling the office for samples of medication:   1.  What medication and dosage are you requesting samples for? Eliquis 5mg   2.  Are you currently out of this medication?  Yes   PT is also calling for test results.  PT also states he needs samples or a low cost Eliquis. He also would like to speak with nurse [place]. Please call

## 2018-07-23 NOTE — Telephone Encounter (Signed)
I attempted to contact patient earlier regarding his monitor results and to find out why he did not show up to his appointment this morning. He did not answer his phone and VM was full.   Patient later walks-in to the clinic for his monitor results and stating that he needs Eliquis samples. I spoke to patient asking him why he did not show up to his appointment this morning and he states that he got busy with trying to get his grandkids to school that he forgot. Made patient aware of his LEA dopp, BMET, and monitor results. Made patient aware that he may require heart cath for ischemic eval. Discussed with Dr. Irish Lack and we will add patient to afternoon schedule since the patient is now in the office.

## 2018-07-23 NOTE — Patient Instructions (Signed)
Medication Instructions:  Your physician recommends that you continue on your current medications as directed. Please refer to the Current Medication list given to you today.  If you need a refill on your cardiac medications before your next appointment, please call your pharmacy.   Lab work: None Ordered  If you have labs (blood work) drawn today and your tests are completely normal, you will receive your results only by: Marland Kitchen MyChart Message (if you have MyChart) OR . A paper copy in the mail If you have any lab test that is abnormal or we need to change your treatment, we will call you to review the results.  Testing/Procedures: Your physician has requested that you have an echocardiogram in 3 months. Echocardiography is a painless test that uses sound waves to create images of your heart. It provides your doctor with information about the size and shape of your heart and how well your heart's chambers and valves are working. This procedure takes approximately one hour. There are no restrictions for this procedure.   Follow-Up: . Your physician recommends that you schedule a follow-up appointment in: 3 months after echo with Dr. Irish Lack only .   Any Other Special Instructions Will Be Listed Below (If Applicable).

## 2018-08-09 ENCOUNTER — Other Ambulatory Visit: Payer: Self-pay

## 2018-08-09 MED ORDER — APIXABAN 5 MG PO TABS: 5.0000 mg | ORAL_TABLET | Freq: Two times a day (BID) | ORAL | 5 refills | Status: DC

## 2018-08-09 MED ORDER — SACUBITRIL-VALSARTAN 24-26 MG PO TABS: 1.0000 | ORAL_TABLET | Freq: Two times a day (BID) | ORAL | 11 refills | Status: DC

## 2018-08-09 MED ORDER — ATORVASTATIN CALCIUM 40 MG PO TABS: 40.0000 mg | ORAL_TABLET | Freq: Every day | ORAL | 3 refills | Status: DC

## 2018-08-09 MED ORDER — METOPROLOL TARTRATE 100 MG PO TABS: 100.0000 mg | ORAL_TABLET | Freq: Two times a day (BID) | ORAL | 3 refills | Status: DC

## 2018-08-09 MED ORDER — FUROSEMIDE 40 MG PO TABS: 40.0000 mg | ORAL_TABLET | Freq: Two times a day (BID) | ORAL | 3 refills | Status: DC

## 2018-08-16 ENCOUNTER — Telehealth: Payer: Self-pay | Admitting: Interventional Cardiology

## 2018-08-16 NOTE — Telephone Encounter (Signed)
Attempted to contact patient but there was no answer and VM was full. ?

## 2018-08-16 NOTE — Telephone Encounter (Signed)
Follow up:    Patient returning call back. Patient states that he is at work and if he do not answer the first time. Patient is asking can you call right back he is at work and some time do not hear the phone the first time. Please call patient.

## 2018-08-16 NOTE — Telephone Encounter (Signed)
° ° °  Pt c/o swelling: STAT is pt has developed SOB within 24 hours  1) How much weight have you gained and in what time span? N/A  2) If swelling, where is the swelling located? LEGS  3) Are you currently taking a fluid pill? YES  4) Are you currently SOB? NO  5) Do you have a log of your daily weights (if so, list)? 206 YESTERDAY  6) Have you gained 3 pounds in a day or 5 pounds in a week? N/A  7) Have you traveled recently? NO

## 2018-08-16 NOTE — Telephone Encounter (Signed)
Returned call to patient. He states that he has been a little more SOB over the past few days. He states that he ran out of his entresto 4 days ago and since then he has felt bad. Patient asking for samples to last him until his new insurance kciks in in a couple of weeks. Samples left up front for patient to pick up. Instructed patient to let me know if his Sx did not improve.

## 2018-09-01 ENCOUNTER — Telehealth: Payer: Self-pay | Admitting: Interventional Cardiology

## 2018-09-01 MED ORDER — SACUBITRIL-VALSARTAN 24-26 MG PO TABS: 1.0000 | ORAL_TABLET | Freq: Two times a day (BID) | ORAL | 10 refills | Status: DC

## 2018-09-01 NOTE — Telephone Encounter (Signed)
New Message       *STAT* If patient is at the pharmacy, call can be transferred to refill team.   1. Which medications need to be refilled? (please list name of each medication and dose if known) Entresto 24-26mg   2. Which pharmacy/location (including street and city if local pharmacy) is medication to be sent to?  [address] in [place]   3. Do they need a 30 day or 90 day supply? 30 day supply

## 2018-09-02 ENCOUNTER — Telehealth: Payer: Self-pay | Admitting: Interventional Cardiology

## 2018-09-02 NOTE — Telephone Encounter (Signed)
New Message    Pt is calling to speak with [name] about one of his medications. He says that [name] knows him well and will help him   Please call

## 2018-09-02 NOTE — Telephone Encounter (Signed)
Returned call to patient who states that his insurance has changed and he will not be able to get his eliquis and entresto for a few weeks until it kicks in. Made patient aware that I will be in the office tomorrow and will check to see if we have samples and I will call and let him know.

## 2018-09-03 NOTE — Telephone Encounter (Signed)
Made patient aware that samples are being left for him to pick up.

## 2018-09-24 ENCOUNTER — Ambulatory Visit: Payer: Self-pay | Admitting: Interventional Cardiology

## 2018-10-06 ENCOUNTER — Other Ambulatory Visit: Payer: Self-pay | Admitting: Interventional Cardiology

## 2018-10-07 ENCOUNTER — Telehealth: Payer: Self-pay | Admitting: *Deleted

## 2018-10-07 ENCOUNTER — Telehealth: Payer: Self-pay

## 2018-10-07 NOTE — Telephone Encounter (Signed)
Called and spoke to patient. He states that he has been out of his Entresto for 5 days. He states that his pharmacy told him that it would be 10-14 days before it gets to him. Patient states that he is completely asymptomatic and states that his BP is fine. Patient has a hx of being non-compliant with his meds and waits until he has been out for a while to report.  Made patient aware that I will speak to PA nurse as she has been working on this and see if we have samples available. Patient requests a call back after 5:30 PM when he gets off of work.   When reviewing the patient's chart and speaking to our PA nurse she has determined that the patient does not need a PA for this med and just needs to update his insurance information. Patient has an echo scheduled tomorrow. PA nurse will arrange for samples of entresto to be left for patient to pick up. Will call and make patient aware at requested time.

## 2018-10-07 NOTE — Telephone Encounter (Signed)
**Note De-Identified [name] Obfuscation** I did a Entresto PA through covermymeds and received the following message:  [name] (Key: [id number])  [name] 24-26MG  tablets  Form [employer] Electronic PA Form  Determination  N/A  Message from [place] already on file for this request.   I called [employer] (Who the PA request came from Gregorio Worley fax) and s/w Erlene Quan who incorrectly transferred my call to the PA  Dept. I was on hold for over 20 mins so I hung up and called back. I s/w Evelena Leyden concerning the need for this [place] PA. Per Rocky Crafts is covered under the pts plan and that a PA is not required.  I called [facility], the pts pharmacy, and s/w Lisa. Lattie Haw re-ran [employer] and again stated that it needs a PA. We compared the pts insurance info and found that the pt has not presented his [employer] card to them. Lattie Haw states that she is calling the pt to request his most recent insurance card as the one we have on file for him is [facility] and was scanned into his chart on 07/16/2018.

## 2018-10-07 NOTE — Telephone Encounter (Signed)
[facility] is requesting Lisinopril 5 mg, it is not on patient's medication list. Please advise. Thank you.

## 2018-10-07 NOTE — Telephone Encounter (Signed)
Called and spoke to patient. Made him aware that he will need to update his insurance card at [facility] if he plans on using them locally. Patient states that he is going to use [employer] as it is cheaper for a 90 day supply and they have his information. Made patient aware that samples of entresto have been left for him to pick up tomorrow that will last him until he receives his mail order. Patient verbalized understanding and thanked me for the call.

## 2018-10-07 NOTE — Telephone Encounter (Signed)
He should be on Entresto.  If he is not on Entresto, then ok to fill lisinopril.

## 2018-10-07 NOTE — Telephone Encounter (Signed)
Attempted to contact patient but there was no answer and VM was full. ?

## 2018-10-08 ENCOUNTER — Ambulatory Visit (HOSPITAL_COMMUNITY): Payer: Medicare HMO | Attending: Internal Medicine

## 2018-10-08 ENCOUNTER — Other Ambulatory Visit: Payer: Self-pay

## 2018-10-08 DIAGNOSIS — I5022 Chronic systolic (congestive) heart failure: Secondary | ICD-10-CM | POA: Insufficient documentation

## 2018-10-15 ENCOUNTER — Telehealth: Payer: Self-pay | Admitting: Interventional Cardiology

## 2018-10-15 NOTE — Telephone Encounter (Signed)
Patient is calling about his [name], he is stating that he is having trouble getting his prescription filled by his mail order pharmacy [employer].  Fax # [phone], phone # [phone]  [employer] Account # [id number], ID# [id number]   Patient states that he only has 4 pills left.    He also has a bill of $125.00 that he doesn't know what to do about.

## 2018-10-15 NOTE — Telephone Encounter (Signed)
I will route to [place] for further follow.

## 2018-10-18 MED ORDER — SACUBITRIL-VALSARTAN 24-26 MG PO TABS: 1.0000 | ORAL_TABLET | Freq: Two times a day (BID) | ORAL | 3 refills | Status: DC

## 2018-10-18 NOTE — Telephone Encounter (Signed)
Pt's medication was sent to pt's pharmacy as requested. Confirmation received.  °

## 2018-10-18 NOTE — Telephone Encounter (Addendum)
**Note De-Identified Bobie Caris Obfuscation** I called the pt and got no answer and message on his VM states that it is full and cannot accept any new messages.  Per the pts chart his Entresto refill was never sent to [facility] and only to [facility] for a 30 day free supply using a co-pay card.  I will forward this message to refills to send the pts Entresto refill to [facility].  I am unsure why he has $125 bill. The pt should contact his insurance provider to find out why he has this bill.  I have already taken care of the [facility] PA request we received 5/7 and found that a PA is not required as the pts ins covers it.

## 2018-10-18 NOTE — Addendum Note (Signed)
Addended by: Derl Barrow on: 10/18/2018 12:39 PM   Modules accepted: Orders

## 2018-10-22 ENCOUNTER — Telehealth: Payer: Self-pay | Admitting: Interventional Cardiology

## 2018-10-22 ENCOUNTER — Telehealth: Payer: Self-pay

## 2018-10-22 NOTE — Telephone Encounter (Signed)
**Note De-Identified Shealynn Saulnier Obfuscation** The following message was received from covermymeds: PA Case: [id number], Status: Approved Coverage Starts on: 06/02/2018  Coverage Ends on: 06/02/2019   Again I have notified Kim at [facility] of approval and she confirms that the PA is longer needed.

## 2018-10-22 NOTE — Telephone Encounter (Signed)
**Note De-Identified Rease Wence Obfuscation** I have done another Entresto PA through covermymeds: Key: [id number]  I have completed several Entresto PAs on this pt and get the same determination that a PA is not required per covermymeds. Ive called the pts pharmacy, [facility] and made them aware and they agree that a PA is not required at this time but will be in August 2020.  Since we continue to receive [facility] PAs for the pt I complete them and contact [employer] with the approval each time. I have also reached out to the pt multiple times to see if he is getting his [name] but get no answer and his VM is full.  I hope this PA clears up all confusion as I am unsure of what else to do.

## 2018-10-22 NOTE — Telephone Encounter (Signed)
New message ° ° °Patient is returning your call for echo results. Please call. °

## 2018-10-22 NOTE — Telephone Encounter (Signed)
Notes recorded by Drue Novel I, RN on 10/22/2018 at 2:47 PM EDT Left message for patient to call back. ------  Notes recorded by Drue Novel I, RN on 10/21/2018 at 2:41 PM EDT Attempted to contact patient again but the VM was full will try again at another time. ------  Notes recorded by Drue Novel I, RN on 10/19/2018 at 4:49 PM EDT Attempted to contact patient but the VM was full will try again at another time.  Jettie Booze, MD Cleon Gustin, RN; Sherren Mocha, MD Lets see if we can do a video visit first before the 28th and adjust his meds.   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 Patient has a visit with you on 5/29. Would you like for me to have him come into the office on 5/28 when you are DOD? ------  Notes recorded by Sherren Mocha, MD on 10/12/2018 at 6:56 PM EDT [name] - I reviewed his echo and I think his MR is undoubtedly severe. 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 With his LV as bad as it is, it might be reasonable to get him in for close APP follow-up for med titration to optimize HF Rx (aldactone, coreg, etc), and repeat echo in 3 months as you're planning to do. I suspect he's headed for a TEE to look into treatment options further. If he's truly Class I, some time for med Rx seems reasonable. I'm happy to see him now or after 3 months whichever you prefer. thx Ronalee Belts ------  Notes recorded by Jettie Booze, MD on 10/10/2018 at 9:57 PM EDT [place], EF is still low. Question of severe mitral regurgitation. Ronalee Belts, Please look at his MR. EF has been low for a while, treated medically for years. His f/u has been sporadic over the years since he worked in [place] for a while. It has been a while since he  was cathed, 2015 I believe. Is this a valve you would consider for mitraclip? Thanks.

## 2018-10-22 NOTE — Telephone Encounter (Signed)
The patient has been notified of the result. Patient wishes to keep his appt on 10/29/18 at 1:20 PM. He has agreed to Video Visit.   Virtual Visit Pre-Appointment Phone Call  TELEPHONE CALL NOTE  [name] has been deemed a candidate for a follow-up tele-health visit to limit community exposure during the Covid-19 pandemic. I spoke with the patient via phone to ensure availability of phone/video source, confirm preferred email & phone number, and discuss instructions and expectations.  I reminded [name] to be prepared with any vital sign and/or heart rhythm information that could potentially be obtained via home monitoring, at the time of his visit. I reminded [name] to expect a phone call prior to his visit.  Patient agrees to consent below.  Trevor Gustin, RN 10/22/2018 5:09 PM   FULL LENGTH CONSENT FOR TELE-HEALTH VISIT   I hereby voluntarily request, consent and authorize [facility] and its employed or contracted physicians, physician assistants, nurse practitioners or other licensed health care professionals (the Practitioner), to provide me with telemedicine health care services (the "Services") as deemed necessary by the treating Practitioner. I acknowledge and consent to receive the Services by the Practitioner via telemedicine. I understand that the telemedicine visit will involve communicating with the Practitioner through live audiovisual communication technology and the disclosure of certain medical information by electronic transmission. I acknowledge that I have been given the opportunity to request an in-person assessment or other available alternative prior to the telemedicine visit and am voluntarily participating in the telemedicine visit.  I understand that I have the right to withhold or withdraw my consent to the use of telemedicine in the course of my care at any time, without affecting my right to future care or treatment, and that the  Practitioner or I may terminate the telemedicine visit at any time. I understand that I have the right to inspect all information obtained and/or recorded in the course of the telemedicine visit and may receive copies of available information for a reasonable fee.  I understand that some of the potential risks of receiving the Services via telemedicine include:  Marland Kitchen Delay or interruption in medical evaluation due to technological equipment failure or disruption; . Information transmitted may not be sufficient (e.g. poor resolution of images) to allow for appropriate medical decision making by the Practitioner; and/or  . In rare instances, security protocols could fail, causing a breach of personal health information.  Furthermore, I acknowledge that it is my responsibility to provide information about my medical history, conditions and care that is complete and accurate to the best of my ability. I acknowledge that Practitioner's advice, recommendations, and/or decision may be based on factors not within their control, such as incomplete or inaccurate data provided by me or distortions of diagnostic images or specimens that may result from electronic transmissions. I understand that the practice of medicine is not an exact science and that Practitioner makes no warranties or guarantees regarding treatment outcomes. I acknowledge that I will receive a copy of this consent concurrently upon execution via email to the email address I last provided but may also request a printed copy by calling the office of [facility].    I understand that my insurance will be billed for this visit.   I have read or had this consent read to me. . I understand the contents of this consent, which adequately explains the benefits and risks of the Services being provided via telemedicine.  Marland Kitchen I  have been provided ample opportunity to ask questions regarding this consent and the Services and have had my questions answered to my  satisfaction. . I give my informed consent for the services to be provided through the use of telemedicine in my medical care  By participating in this telemedicine visit I agree to the above.

## 2018-10-29 ENCOUNTER — Encounter: Payer: Self-pay | Admitting: Interventional Cardiology

## 2018-10-29 ENCOUNTER — Other Ambulatory Visit: Payer: Self-pay

## 2018-10-29 ENCOUNTER — Telehealth (INDEPENDENT_AMBULATORY_CARE_PROVIDER_SITE_OTHER): Payer: Medicare HMO | Admitting: Interventional Cardiology

## 2018-10-29 VITALS — Ht 71.0 in | Wt 208.0 lb

## 2018-10-29 DIAGNOSIS — I4821 Permanent atrial fibrillation: Secondary | ICD-10-CM

## 2018-10-29 DIAGNOSIS — I10 Essential (primary) hypertension: Secondary | ICD-10-CM

## 2018-10-29 DIAGNOSIS — E785 Hyperlipidemia, unspecified: Secondary | ICD-10-CM | POA: Diagnosis not present

## 2018-10-29 DIAGNOSIS — I5022 Chronic systolic (congestive) heart failure: Secondary | ICD-10-CM | POA: Diagnosis not present

## 2018-10-29 DIAGNOSIS — I059 Rheumatic mitral valve disease, unspecified: Secondary | ICD-10-CM
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
## 2018-10-29 NOTE — Patient Instructions (Addendum)
Medication Instructions:  Your physician recommends that you continue on your current medications as directed. Please refer to the Current Medication list given to you today.  Lab work: None Ordered  If you have labs (blood work) drawn today and your tests are completely normal, you will receive your results only by: Marland Kitchen MyChart Message (if you have MyChart) OR . A paper copy in the mail If you have any lab test that is abnormal or we need to change your treatment, we will call you to review the results.  Testing/Procedures: Your physician has requested that you have an echocardiogram on 01/28/19 at 9:30 AM. Echocardiography is a painless test that uses sound waves to create images of your heart. It provides your doctor with information about the size and shape of your heart and how well your heart's chambers and valves are working. This procedure takes approximately one hour. There are no restrictions for this procedure.   Follow-Up: Follow up with Dr. Irish Lack in the office with an EKG on 11/29/18 at 4:20 PM.  Any Other Special Instructions Will Be Listed Below (If Applicable).

## 2018-11-01 ENCOUNTER — Telehealth: Payer: Self-pay | Admitting: Interventional Cardiology

## 2018-11-01 NOTE — Telephone Encounter (Signed)
Did not need this encounter °

## 2018-11-05 ENCOUNTER — Telehealth: Payer: Self-pay

## 2018-11-05 NOTE — Telephone Encounter (Signed)
The pt left 2 letters at the office that he received from [facility] concerning his [place]. The 1st letter was written on 10/22/2018 stating that they have approved the pts Entresto PA until 06/02/2019. The 2nd letter was written on 10/23/2018 stating that rthey denied his [place] PA.  I called [employer] and s/w Dora. After answering a couple questions concerning the quantity/days supply of Entresto that the pts needs Sydell Axon stated that the PA was approved on 06/02/2018 and is good until 06/02/2019.  Sydell Axon states that she does not know why the pt received an approval and denial letter but confirmed that the cost to the pt is $125/90 day supply. Auth#: [id number] Case ID#: [id number]  I called the pt and made him aware. He states that he received his Entresto in the mail yesterday and that he is ok with the cost of $125/90 day supply.  He thanked me for my help and he also expressed much gratitude towards Drue Novel, RN who is Dr Hassell Done nurse. He states that she is great at her job and that he appreciates her so much.

## 2018-11-24 ENCOUNTER — Telehealth: Payer: Self-pay

## 2018-11-24 NOTE — Telephone Encounter (Signed)
Called pt to complete covid pre screen questions. Unable to leave message due to mailbox being full.

## 2018-11-26 ENCOUNTER — Telehealth: Payer: Self-pay | Admitting: Interventional Cardiology

## 2018-11-26 NOTE — Telephone Encounter (Signed)
Follow up ° ° ° ° °COVID SCREENING QUESTIONS FOR IN-OFFICE VISITS - PLEASE DOCUMENT PATIENT ANSWERS ° °1. Do you currently have a fever? ° °a. NO ° °2. Have you recently traveled on a cruise, internationally, or to [place], [place], [place], [place], [place], or [place], [place] ([employer])? ° °a. NO ° °3. Have you been in contact with someone that is currently pending confirmation of Covid19 testing or has been confirmed to have the Covid19 virus? ° °a. NO ° °4. Are you currently experiencing fatigue or cough? ° °a. NO ° ° °

## 2018-11-26 NOTE — Telephone Encounter (Signed)
Patient returned your call.

## 2018-11-26 NOTE — Telephone Encounter (Signed)
New Message    Called to confirm appt and have COVID Questions answered. Unable to leave message due to mailbox being full.

## 2018-11-29 ENCOUNTER — Other Ambulatory Visit: Payer: Self-pay

## 2018-11-29 ENCOUNTER — Ambulatory Visit (INDEPENDENT_AMBULATORY_CARE_PROVIDER_SITE_OTHER): Payer: Medicare HMO | Admitting: Interventional Cardiology

## 2018-11-29 ENCOUNTER — Encounter: Payer: Self-pay | Admitting: Interventional Cardiology

## 2018-11-29 VITALS — BP 142/84 | HR 76 | Ht 71.0 in | Wt 221.2 lb

## 2018-11-29 DIAGNOSIS — I10 Essential (primary) hypertension: Secondary | ICD-10-CM | POA: Diagnosis not present

## 2018-11-29 DIAGNOSIS — I428 Other cardiomyopathies: Secondary | ICD-10-CM

## 2018-11-29 DIAGNOSIS — I4821 Permanent atrial fibrillation: Secondary | ICD-10-CM

## 2018-11-29 DIAGNOSIS — I059 Rheumatic mitral valve disease, unspecified: Secondary | ICD-10-CM | POA: Diagnosis not present

## 2018-11-29 DIAGNOSIS — I5022 Chronic systolic (congestive) heart failure: Secondary | ICD-10-CM | POA: Diagnosis not present

## 2018-11-29 MED ORDER — AMLODIPINE BESYLATE 5 MG PO TABS: 5.0000 mg | ORAL_TABLET | Freq: Every day | ORAL | 3 refills | Status: DC

## 2018-11-29 NOTE — Patient Instructions (Signed)
Medication Instructions:  Your physician has recommended you make the following change in your medication:   Start Amlodipine 5MG , 1 tablet once a day  If you need a refill on your cardiac medications before your next appointment, please call your pharmacy.   Lab work:  None ordered today   Testing/Procedures:  None ordered today  Follow-Up:
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
## 2019-01-25 ENCOUNTER — Other Ambulatory Visit: Payer: Self-pay

## 2019-01-25 ENCOUNTER — Telehealth (INDEPENDENT_AMBULATORY_CARE_PROVIDER_SITE_OTHER): Payer: Medicare HMO | Admitting: Interventional Cardiology

## 2019-01-25 ENCOUNTER — Encounter: Payer: Self-pay | Admitting: Interventional Cardiology

## 2019-01-25 VITALS — Ht 71.0 in

## 2019-01-25 DIAGNOSIS — I428 Other cardiomyopathies: Secondary | ICD-10-CM

## 2019-01-25 DIAGNOSIS — I4891 Unspecified atrial fibrillation: Secondary | ICD-10-CM

## 2019-01-25 DIAGNOSIS — I340 Nonrheumatic mitral (valve) insufficiency: Secondary | ICD-10-CM | POA: Diagnosis not present

## 2019-01-25 DIAGNOSIS — Z7901 Long term (current) use of anticoagulants: Secondary | ICD-10-CM | POA: Diagnosis not present

## 2019-01-25 DIAGNOSIS — I4821 Permanent atrial fibrillation: Secondary | ICD-10-CM

## 2019-01-25 DIAGNOSIS — I059 Rheumatic mitral valve disease, unspecified: Secondary | ICD-10-CM

## 2019-01-25 DIAGNOSIS — I5022 Chronic systolic (congestive) heart failure: Secondary | ICD-10-CM

## 2019-01-25 NOTE — Patient Instructions (Signed)
Medication Instructions:  Your physician recommends that you continue on your current medications as directed. Please refer to the Current Medication list given to you today.  If you need a refill on your cardiac medications before your next appointment, please call your pharmacy.   Lab work: NONE If you have labs (blood work) drawn today and your tests are completely normal, you will receive your results only by: Marland Kitchen MyChart Message (if you have MyChart) OR . A paper copy in the mail If you have any lab test that is abnormal or we need to change your treatment, we will call you to review the results.  Testing/Procedures: ECHOCARDIOGRAM AS SCHEDULED  Follow-Up: At [facility], you and your health needs are our priority.  As part of our continuing mission to provide you with exceptional heart care, we have created designated Provider Care Teams.  These Care Teams include your primary Cardiologist (physician) and Advanced Practice Providers (APPs -  Physician Assistants and Nurse Practitioners) who all work together to provide you with the care you need, when you need it. You will need a follow up appointment BASED ON ECHOCARDIOGRAM RESULTS. Please call our office 2 months in advance to schedule this appointment.  You may see Dr. Irish Lack or one of the following Advanced Practice Providers on your designated Care Team:   [place], PA-C Melina Copa, PA-C . Ermalinda Barrios, PA-C  Any Other Special Instructions Will Be Listed Below (If Applicable). We will contact you with results of Echocardiogram and time frame for follow-up visit at that time.
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
## 2019-01-28 ENCOUNTER — Other Ambulatory Visit: Payer: Self-pay

## 2019-01-28 ENCOUNTER — Telehealth: Payer: Self-pay | Admitting: Interventional Cardiology

## 2019-01-28 ENCOUNTER — Ambulatory Visit (HOSPITAL_COMMUNITY): Payer: Medicare HMO | Attending: Cardiology

## 2019-01-28 DIAGNOSIS — I5022 Chronic systolic (congestive) heart failure: Secondary | ICD-10-CM | POA: Diagnosis present

## 2019-01-28 DIAGNOSIS — I059 Rheumatic mitral valve disease, unspecified: Secondary | ICD-10-CM | POA: Insufficient documentation

## 2019-01-28 MED ORDER — PERFLUTREN LIPID MICROSPHERE
1.0000 mL | INTRAVENOUS | Status: AC | PRN
  Administered 2019-01-28: 1 mL via INTRAVENOUS

## 2019-01-28 MED ORDER — APIXABAN 5 MG PO TABS: 5.0000 mg | ORAL_TABLET | Freq: Two times a day (BID) | ORAL | 1 refills | Status: DC

## 2019-01-28 NOTE — Telephone Encounter (Signed)
New message    Pt is here for his echo this morning. He said that [place] up his xarelto prescription and only sent him a months worth. He is out. He said [employer] told him that our office didn't send enough information for him to get 3 month supply.

## 2019-01-28 NOTE — Telephone Encounter (Signed)
Spoke with patient who is here for his echocardiogram and he is out of his Eliquis. He states that [facility] only sent 1 month supply. Samples provided and new Rx sent to patient's pharmacy.

## 2019-03-03 ENCOUNTER — Telehealth: Payer: Self-pay | Admitting: Interventional Cardiology

## 2019-03-03 NOTE — Telephone Encounter (Signed)
° ° °  Please return call to patient °

## 2019-03-03 NOTE — Telephone Encounter (Signed)
Attempted to contact patient but there was no answer and VM did not pick up. 

## 2019-03-03 NOTE — Telephone Encounter (Signed)
New message    Pt c/o medication issue:  1. Name of Medication: Eliquis and Entresto  2. How are you currently taking this medication (dosage and times per day)?  5mg  two time daily and 24-26mg  two times daily   3. Are you having a reaction (difficulty breathing--STAT)? No  4. What is your medication issue? Patient states he's in the donut whole and wants to know if there is a cheaper alternative.

## 2019-03-03 NOTE — Telephone Encounter (Signed)
Attempted to contact patient again but there was no answer and VM did not pick up. 

## 2019-03-10 NOTE — Telephone Encounter (Signed)
Called and spoke to patient. He states that he cannot afford his Eliquis. He states that he was told he is in the donut hole and that it would be $400/mo. He states that he does not have any Eliquis left. He states that he has only been working 30 hours a week and that his financial status should improve next month. He is asking if there is anything that we can do to assist him during this time. Patient states that he still has entresto at this time. 2 week supply of Eliquis left downstairs. Made patient aware that I will send to our PA nurse for review, but if there was not anything that she can do we may need to look at other therapies. Patient verbalized understanding and thanked me for the call.

## 2019-03-10 NOTE — Telephone Encounter (Signed)
Pt states that he is almost at the office to pick up his Eliquis samples and cannot write down info/phone numbers for [employer] pt asst for his [name] and [employer] Pt asst for his Eliquis and requests that I call him back at [phone] and leave a detailed message with this information and that he will call both foundations ASAP.  He is aware to ask both foundations to mail him an application and to complete his part, obtain needed documents,bring all to the office so we can take care of the provider parts of both applications and fax to foundations.

## 2019-03-10 NOTE — Telephone Encounter (Signed)
Patient  retuning a call from [place] regarding his Eliquis. He has been out for 3 days, and was hoping the office had some answers for a way to help him get his medicine. He is in the donut hole and his medicine would cost him $400/mo

## 2019-03-24 ENCOUNTER — Telehealth: Payer: Self-pay

## 2019-03-24 NOTE — Telephone Encounter (Signed)
The pt was advised to contact [employer] Pt asst for help with his [name] and [employer] pt asst for help with his Eliquis on 03/03/2019 to ask questions concerning his eligibility with each program and to request that they mail him the applications.  He states that he did contact them and they were to fax [place] his applications and he would come by the office to fill his part out.  I explained that in most cases we receive applications from programs without a pts name on them and that must be what happened.  I also explained that it is best to request that they mail him the applications as there are instructions and documents that he will need to provide when applying.  He states that he will call them back and request they mail him his applications.  The pt reports that he is almost out of Eliquis and only has about a weeks worth of Entresto left. He is advised him that we are leaving him 2 bottles of Eliquis sample at our Covid-19 screening table in the downstairs lobby at Dr. Hassell Done office for him to pick up and to call me when he is down to 2 days worth of Entresto as we are always limited in our supply of that med.

## 2019-07-12 ENCOUNTER — Telehealth: Payer: Self-pay | Admitting: Interventional Cardiology

## 2019-07-12 NOTE — Telephone Encounter (Signed)
Patient calling the office for samples of medication:   1.  What medication and dosage are you requesting samples for? apixaban (ELIQUIS) 5 MG TABS tablet sacubitril-valsartan (ENTRESTO) 24-26 MG  2.  Are you currently out of this medication?  Yes  Patient is calling requesting a two week supply of samples to last him through until his mail order of prescriptions come in. He states is going on his 4th week without any of his medications due to having to sort out some issues with his insurance. He states he is beginning to experience SOB from not having his medications for so long. The patient is requesting Drue Novel call him back in regards to this. Please advise.

## 2019-07-12 NOTE — Telephone Encounter (Signed)
Called and spoke to patient. He states that he has been out of his eliquis and entresto for 4 weeks. Made patient aware that 2 weeks of eliquis and 1 bottle of entresto have been left for him to pick up. Instructed patient to contact [employer] for his Entresto and [facility] for his Eliquis. Made him aware that he will need to fill out the applications and get them to [place] to complete. Made patient aware that without this being done then we cannot move forward with helping him to get assistance. Made him aware that without this we will need to have Dr. Irish Lack look at switching his medications. Patient verbalized understanding and thanked me for the call.

## 2019-07-12 NOTE — Telephone Encounter (Signed)
Wyonia Hough, LPN, pt is requesting samples of Eliquis and Entresto, because he is waiting on mail order to arrive. Can you please advise on this matter? Thank you

## 2019-07-12 NOTE — Telephone Encounter (Signed)
Thanks lynn, leaving medications now.

## 2019-07-12 NOTE — Telephone Encounter (Signed)
**Note De-Identified [name] Obfuscation** Yes please give him 2 weeks worth of Eliquis and a bottle of Entresto samples. Thank you!

## 2019-08-11 ENCOUNTER — Other Ambulatory Visit: Payer: Self-pay

## 2019-08-11 DIAGNOSIS — I4821 Permanent atrial fibrillation: Secondary | ICD-10-CM

## 2019-08-11 DIAGNOSIS — Z5181 Encounter for therapeutic drug level monitoring: Secondary | ICD-10-CM

## 2019-08-11 MED ORDER — APIXABAN 5 MG PO TABS: 5.0000 mg | ORAL_TABLET | Freq: Two times a day (BID) | ORAL | 0 refills | Status: DC

## 2019-08-11 MED ORDER — FUROSEMIDE 40 MG PO TABS: 40.0000 mg | ORAL_TABLET | Freq: Two times a day (BID) | ORAL | 0 refills | Status: DC

## 2019-08-11 MED ORDER — AMLODIPINE BESYLATE 5 MG PO TABS: 5.0000 mg | ORAL_TABLET | Freq: Every day | ORAL | 0 refills | Status: DC

## 2019-08-11 MED ORDER — METOPROLOL TARTRATE 100 MG PO TABS: 100.0000 mg | ORAL_TABLET | Freq: Two times a day (BID) | ORAL | 0 refills | Status: DC

## 2019-08-11 MED ORDER — ATORVASTATIN CALCIUM 40 MG PO TABS: 40.0000 mg | ORAL_TABLET | Freq: Every day | ORAL | 0 refills | Status: DC

## 2019-08-11 MED ORDER — ENTRESTO 24-26 MG PO TABS: 1.0000 | ORAL_TABLET | Freq: Two times a day (BID) | ORAL | 0 refills | Status: DC

## 2019-08-11 NOTE — Telephone Encounter (Signed)
Pt last saw Dr Irish Lack 01/25/19 telemedicine Covid-19, last labs 07/16/18 Creat 0.78, pt is overdue for labwork.  Age 69, weight 100.3kg, based on specified criteria pt is on appropriate dosage of Eliquis 5mg  BID.  Pt has a recall for 6 month f/u with Dr Irish Lack in [place], pt due to be seen in March.  Will send message to scheduling pool to schedule 6 month f/u with labs for pt.  Will give pt 3 month refill to get to that appt.

## 2019-08-15 ENCOUNTER — Telehealth: Payer: Self-pay | Admitting: Interventional Cardiology

## 2019-08-15 ENCOUNTER — Other Ambulatory Visit: Payer: Medicare HMO

## 2019-08-15 ENCOUNTER — Other Ambulatory Visit: Payer: Medicare Other

## 2019-08-15 NOTE — Telephone Encounter (Signed)
Patient calling the office for samples of medication:   1.  What medication and dosage are you requesting samples for? apixaban (ELIQUIS) 5 MG TABS tablet / sacubitril-valsartan (ENTRESTO) 24-26 MG  2.  Are you currently out of this medication? yes

## 2019-08-15 NOTE — Telephone Encounter (Signed)
Attempted to contact patient, but there was no answer and VM was full. 

## 2019-08-15 NOTE — Telephone Encounter (Signed)
Called pt to see if pt has applied for pt assistant and pt stated that he called both places and still has not received the form to apply. Jeani Hawking, LPN could you please assist with this matter and advise on samples. I do not have samples of entresto 24-26 mg and have about 12 boxes of eliquis, so my samples are limited. Please advise thank you

## 2019-08-15 NOTE — Telephone Encounter (Signed)
**Note De-Identified Sequoyah Ramone Obfuscation** I am forwarding this to Dr Hassell Done nurse as this pt ask for samples often.  He should be calling [employer] at [phone] for an application as I have recommended multiple times. If the pt cannot afford we may need to consider other medications that he can afford as my concern is that he maybe missing doses due to cost of Entresto and the office does not have many samples.

## 2019-08-16 NOTE — Telephone Encounter (Signed)
Attempted to contact the patient again, but there was no answer and VM was full.

## 2019-08-22 NOTE — Telephone Encounter (Signed)
Attempted to contact the patient again but there was no answer and VM is full.

## 2019-08-26 ENCOUNTER — Other Ambulatory Visit: Payer: Medicare Other

## 2019-08-29 NOTE — Telephone Encounter (Signed)
Left message to call back  

## 2019-09-07 NOTE — Telephone Encounter (Signed)
Attempted to contact this patient again regarding his entresto and eliquis. Multiple attempts have been made will send a letter for patient to contact the office.

## 2019-09-09 ENCOUNTER — Other Ambulatory Visit: Payer: Self-pay | Admitting: Interventional Cardiology

## 2019-09-16 ENCOUNTER — Ambulatory Visit: Payer: Medicare Other | Admitting: Interventional Cardiology

## 2019-10-05 ENCOUNTER — Inpatient Hospital Stay (HOSPITAL_COMMUNITY)
Admission: EM | Admit: 2019-10-05 | Discharge: 2019-10-13 | DRG: 330 | Disposition: A | Discharge status: Home / Self Care | Payer: Medicare Other | Attending: Family Medicine | Admitting: Family Medicine

## 2019-10-05 ENCOUNTER — Other Ambulatory Visit: Payer: Self-pay

## 2019-10-05 ENCOUNTER — Encounter (HOSPITAL_COMMUNITY): Payer: Self-pay | Admitting: Emergency Medicine

## 2019-10-05 DIAGNOSIS — I2510 Atherosclerotic heart disease of native coronary artery without angina pectoris: Secondary | ICD-10-CM | POA: Diagnosis present

## 2019-10-05 DIAGNOSIS — B356 Tinea cruris: Secondary | ICD-10-CM | POA: Diagnosis present

## 2019-10-05 DIAGNOSIS — K5730 Diverticulosis of large intestine without perforation or abscess without bleeding: Secondary | ICD-10-CM | POA: Diagnosis present

## 2019-10-05 DIAGNOSIS — C182 Malignant neoplasm of ascending colon: Secondary | ICD-10-CM | POA: Diagnosis not present

## 2019-10-05 DIAGNOSIS — Z9049 Acquired absence of other specified parts of digestive tract: Secondary | ICD-10-CM

## 2019-10-05 DIAGNOSIS — I4821 Permanent atrial fibrillation: Secondary | ICD-10-CM | POA: Diagnosis present

## 2019-10-05 DIAGNOSIS — K921 Melena: Secondary | ICD-10-CM | POA: Diagnosis not present

## 2019-10-05 DIAGNOSIS — I428 Other cardiomyopathies: Secondary | ICD-10-CM | POA: Diagnosis present

## 2019-10-05 DIAGNOSIS — E785 Hyperlipidemia, unspecified: Secondary | ICD-10-CM | POA: Diagnosis present

## 2019-10-05 DIAGNOSIS — R918 Other nonspecific abnormal finding of lung field: Secondary | ICD-10-CM | POA: Diagnosis present

## 2019-10-05 DIAGNOSIS — I110 Hypertensive heart disease with heart failure: Secondary | ICD-10-CM | POA: Diagnosis present

## 2019-10-05 DIAGNOSIS — Z20822 Contact with and (suspected) exposure to covid-19: Secondary | ICD-10-CM | POA: Diagnosis present

## 2019-10-05 DIAGNOSIS — J302 Other seasonal allergic rhinitis: Secondary | ICD-10-CM | POA: Diagnosis present

## 2019-10-05 DIAGNOSIS — I447 Left bundle-branch block, unspecified: Secondary | ICD-10-CM | POA: Diagnosis present

## 2019-10-05 DIAGNOSIS — M160 Bilateral primary osteoarthritis of hip: Secondary | ICD-10-CM | POA: Diagnosis present

## 2019-10-05 DIAGNOSIS — K6389 Other specified diseases of intestine: Secondary | ICD-10-CM

## 2019-10-05 DIAGNOSIS — Z7901 Long term (current) use of anticoagulants: Secondary | ICD-10-CM

## 2019-10-05 DIAGNOSIS — Z833 Family history of diabetes mellitus: Secondary | ICD-10-CM

## 2019-10-05 DIAGNOSIS — Z87891 Personal history of nicotine dependence: Secondary | ICD-10-CM

## 2019-10-05 DIAGNOSIS — Z89421 Acquired absence of other right toe(s): Secondary | ICD-10-CM

## 2019-10-05 DIAGNOSIS — C7A8 Other malignant neuroendocrine tumors: Secondary | ICD-10-CM

## 2019-10-05 DIAGNOSIS — Z9119 Patient's noncompliance with other medical treatment and regimen: Secondary | ICD-10-CM

## 2019-10-05 DIAGNOSIS — E039 Hypothyroidism, unspecified: Secondary | ICD-10-CM | POA: Diagnosis present

## 2019-10-05 DIAGNOSIS — K635 Polyp of colon: Secondary | ICD-10-CM | POA: Diagnosis present

## 2019-10-05 DIAGNOSIS — C772 Secondary and unspecified malignant neoplasm of intra-abdominal lymph nodes: Secondary | ICD-10-CM | POA: Diagnosis present

## 2019-10-05 DIAGNOSIS — G4733 Obstructive sleep apnea (adult) (pediatric): Secondary | ICD-10-CM | POA: Diagnosis present

## 2019-10-05 DIAGNOSIS — M109 Gout, unspecified: Secondary | ICD-10-CM | POA: Diagnosis present

## 2019-10-05 DIAGNOSIS — K648 Other hemorrhoids: Secondary | ICD-10-CM | POA: Diagnosis present

## 2019-10-05 DIAGNOSIS — Z79899 Other long term (current) drug therapy: Secondary | ICD-10-CM

## 2019-10-05 DIAGNOSIS — I5022 Chronic systolic (congestive) heart failure: Secondary | ICD-10-CM | POA: Diagnosis present

## 2019-10-05 DIAGNOSIS — R911 Solitary pulmonary nodule: Secondary | ICD-10-CM | POA: Diagnosis present

## 2019-10-05 DIAGNOSIS — K922 Gastrointestinal hemorrhage, unspecified: Secondary | ICD-10-CM

## 2019-10-05 DIAGNOSIS — H9190 Unspecified hearing loss, unspecified ear: Secondary | ICD-10-CM | POA: Diagnosis present

## 2019-10-05 DIAGNOSIS — D696 Thrombocytopenia, unspecified: Secondary | ICD-10-CM | POA: Diagnosis present

## 2019-10-05 DIAGNOSIS — I5020 Unspecified systolic (congestive) heart failure: Secondary | ICD-10-CM

## 2019-10-05 LAB — COMPREHENSIVE METABOLIC PANEL
ALT: 21 U/L (ref 0–44)
AST: 23 U/L (ref 15–41)
Albumin: 3.6 g/dL (ref 3.5–5.0)
Alkaline Phosphatase: 76 U/L (ref 38–126)
Anion gap: 12 (ref 5–15)
BUN: 12 mg/dL (ref 8–23)
CO2: 22 mmol/L (ref 22–32)
Calcium: 8.9 mg/dL (ref 8.9–10.3)
Chloride: 106 mmol/L (ref 98–111)
Creatinine, Ser: 0.7 mg/dL (ref 0.61–1.24)
GFR calc Af Amer: >60 mL/min (ref >60)
GFR calc non Af Amer: >60 mL/min (ref >60)
Glucose, Bld: 104 mg/dL — ABNORMAL HIGH (ref 70–99)
Potassium: 4.1 mmol/L (ref 3.5–5.1)
Sodium: 140 mmol/L (ref 135–145)
Total Bilirubin: 0.6 mg/dL (ref 0.3–1.2)
Total Protein: 6.4 g/dL — ABNORMAL LOW (ref 6.5–8.1)

## 2019-10-05 LAB — CBC
HCT: 34.3 % — ABNORMAL LOW (ref 39.0–52.0)
Hemoglobin: 11.4 g/dL — ABNORMAL LOW (ref 13.0–17.0)
MCH: 30.1 pg (ref 26.0–34.0)
MCHC: 33.2 g/dL (ref 30.0–36.0)
MCV: 90.5 fL (ref 80.0–100.0)
Platelets: 170 10*3/uL (ref 150–400)
RBC: 3.79 MIL/uL — ABNORMAL LOW (ref 4.22–5.81)
RDW: 14.6 % (ref 11.5–15.5)
WBC: 6.2 10*3/uL (ref 4.0–10.5)
nRBC: 0.3 % — ABNORMAL HIGH (ref 0.0–0.2)

## 2019-10-05 LAB — RESPIRATORY PANEL BY RT PCR (FLU A&B, COVID)
Influenza A by PCR: NEGATIVE
Influenza B by PCR: NEGATIVE
SARS Coronavirus 2 by RT PCR: NEGATIVE

## 2019-10-05 LAB — HIV ANTIBODY (ROUTINE TESTING W REFLEX): HIV Screen 4th Generation wRfx: NONREACTIVE

## 2019-10-05 LAB — POC OCCULT BLOOD, ED: Fecal Occult Bld: POSITIVE — AB

## 2019-10-05 LAB — TYPE AND SCREEN
ABO/RH(D): B POS
Antibody Screen: NEGATIVE

## 2019-10-05 MED ORDER — ACETAMINOPHEN 325 MG PO TABS: 650.0000 mg | ORAL_TABLET | Freq: Four times a day (QID) | ORAL | Status: DC | PRN

## 2019-10-05 MED ORDER — ATORVASTATIN CALCIUM 40 MG PO TABS
40.0000 mg | ORAL_TABLET | Freq: Every day | ORAL | Status: DC
  Administered 2019-10-05 – 2019-10-12 (×8): 40 mg via ORAL
  Filled 2019-10-05 (×9): qty 1

## 2019-10-05 MED ORDER — MELATONIN 3 MG PO TABS
3.0000 mg | ORAL_TABLET | Freq: Every day | ORAL | Status: DC
  Administered 2019-10-05 – 2019-10-12 (×6): 3 mg via ORAL
  Filled 2019-10-05 (×9): qty 1

## 2019-10-05 MED ORDER — AMLODIPINE BESYLATE 5 MG PO TABS
5.0000 mg | ORAL_TABLET | Freq: Every morning | ORAL | Status: DC
  Administered 2019-10-07 – 2019-10-13 (×6): 5 mg via ORAL
  Filled 2019-10-05 (×7): qty 1

## 2019-10-05 MED ORDER — ACETAMINOPHEN 650 MG RE SUPP: 650.0000 mg | Freq: Four times a day (QID) | RECTAL | Status: DC | PRN

## 2019-10-05 MED ORDER — SACUBITRIL-VALSARTAN 24-26 MG PO TABS
1.0000 | ORAL_TABLET | Freq: Two times a day (BID) | ORAL | Status: DC
  Administered 2019-10-06 – 2019-10-13 (×16): 1 via ORAL
  Filled 2019-10-05 (×18): qty 1

## 2019-10-05 MED ORDER — METOPROLOL TARTRATE 50 MG PO TABS
100.0000 mg | ORAL_TABLET | Freq: Two times a day (BID) | ORAL | Status: DC
  Administered 2019-10-05 – 2019-10-13 (×16): 100 mg via ORAL
  Filled 2019-10-05 (×8): qty 2
  Filled 2019-10-05: qty 4
  Filled 2019-10-05 (×2): qty 2
  Filled 2019-10-05: qty 4
  Filled 2019-10-05 (×4): qty 2

## 2019-10-05 MED ORDER — LORATADINE 10 MG PO TABS
5.0000 mg | ORAL_TABLET | Freq: Every evening | ORAL | Status: DC | PRN
  Administered 2019-10-05 – 2019-10-07 (×2): 5 mg via ORAL
  Filled 2019-10-05 (×2): qty 1

## 2019-10-05 MED ORDER — CLOTRIMAZOLE 1 % EX CREA
TOPICAL_CREAM | Freq: Two times a day (BID) | CUTANEOUS | Status: DC
  Filled 2019-10-05 (×3): qty 15

## 2019-10-05 NOTE — ED Triage Notes (Signed)
Patient reports blood in stool starting Thursday night. Patient on eliquis and stopped taking it when bleeding started. initially bright red blood and after stopping medicine stool is not as red. Reports hx of hemorrhoids issues but unsure if this is related.

## 2019-10-05 NOTE — Progress Notes (Signed)
Family medicine progress update  Touched base with Dr. Ardis Hughs from with our gastroenterology.  Asked if they would like for [place] to go ahead and start bowel prep in anticipation of possible colonoscopy/EGD tomorrow.  He opted to defer starting bowel prep until tomorrow after they formally consult and meet the patient.  Appreciate gastroenterology help with this patient.  Full H&P to follow.  Guadalupe Dawn MD PGY-3 Family Medicine Resident
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
## 2019-10-06 DIAGNOSIS — Z89421 Acquired absence of other right toe(s): Secondary | ICD-10-CM | POA: Diagnosis not present

## 2019-10-06 DIAGNOSIS — G4733 Obstructive sleep apnea (adult) (pediatric): Secondary | ICD-10-CM | POA: Diagnosis present

## 2019-10-06 DIAGNOSIS — Z01810 Encounter for preprocedural cardiovascular examination: Secondary | ICD-10-CM | POA: Diagnosis not present

## 2019-10-06 DIAGNOSIS — I5020 Unspecified systolic (congestive) heart failure: Secondary | ICD-10-CM | POA: Diagnosis not present

## 2019-10-06 DIAGNOSIS — E785 Hyperlipidemia, unspecified: Secondary | ICD-10-CM | POA: Diagnosis present

## 2019-10-06 DIAGNOSIS — K648 Other hemorrhoids: Secondary | ICD-10-CM | POA: Diagnosis present

## 2019-10-06 DIAGNOSIS — I428 Other cardiomyopathies: Secondary | ICD-10-CM | POA: Diagnosis present

## 2019-10-06 DIAGNOSIS — K5730 Diverticulosis of large intestine without perforation or abscess without bleeding: Secondary | ICD-10-CM | POA: Diagnosis present

## 2019-10-06 DIAGNOSIS — I10 Essential (primary) hypertension: Secondary | ICD-10-CM | POA: Diagnosis not present

## 2019-10-06 DIAGNOSIS — I110 Hypertensive heart disease with heart failure: Secondary | ICD-10-CM | POA: Diagnosis present

## 2019-10-06 DIAGNOSIS — C772 Secondary and unspecified malignant neoplasm of intra-abdominal lymph nodes: Secondary | ICD-10-CM | POA: Diagnosis present

## 2019-10-06 DIAGNOSIS — I482 Chronic atrial fibrillation, unspecified: Secondary | ICD-10-CM | POA: Diagnosis not present

## 2019-10-06 DIAGNOSIS — B356 Tinea cruris: Secondary | ICD-10-CM | POA: Diagnosis present

## 2019-10-06 DIAGNOSIS — M160 Bilateral primary osteoarthritis of hip: Secondary | ICD-10-CM | POA: Diagnosis present

## 2019-10-06 DIAGNOSIS — K922 Gastrointestinal hemorrhage, unspecified: Secondary | ICD-10-CM

## 2019-10-06 DIAGNOSIS — M109 Gout, unspecified: Secondary | ICD-10-CM | POA: Diagnosis present

## 2019-10-06 DIAGNOSIS — Z833 Family history of diabetes mellitus: Secondary | ICD-10-CM | POA: Diagnosis not present

## 2019-10-06 DIAGNOSIS — Z87891 Personal history of nicotine dependence: Secondary | ICD-10-CM | POA: Diagnosis not present

## 2019-10-06 DIAGNOSIS — I4821 Permanent atrial fibrillation: Secondary | ICD-10-CM | POA: Diagnosis present

## 2019-10-06 DIAGNOSIS — I447 Left bundle-branch block, unspecified: Secondary | ICD-10-CM | POA: Diagnosis present

## 2019-10-06 DIAGNOSIS — C7A8 Other malignant neuroendocrine tumors: Secondary | ICD-10-CM | POA: Diagnosis not present

## 2019-10-06 DIAGNOSIS — I4811 Longstanding persistent atrial fibrillation: Secondary | ICD-10-CM | POA: Diagnosis not present

## 2019-10-06 DIAGNOSIS — I5022 Chronic systolic (congestive) heart failure: Secondary | ICD-10-CM | POA: Diagnosis present

## 2019-10-06 DIAGNOSIS — C182 Malignant neoplasm of ascending colon: Secondary | ICD-10-CM | POA: Diagnosis present

## 2019-10-06 DIAGNOSIS — K921 Melena: Secondary | ICD-10-CM | POA: Diagnosis present

## 2019-10-06 DIAGNOSIS — D696 Thrombocytopenia, unspecified: Secondary | ICD-10-CM | POA: Diagnosis present

## 2019-10-06 DIAGNOSIS — H9190 Unspecified hearing loss, unspecified ear: Secondary | ICD-10-CM | POA: Diagnosis present

## 2019-10-06 DIAGNOSIS — E039 Hypothyroidism, unspecified: Secondary | ICD-10-CM | POA: Diagnosis present

## 2019-10-06 DIAGNOSIS — K6389 Other specified diseases of intestine: Secondary | ICD-10-CM | POA: Diagnosis not present

## 2019-10-06 DIAGNOSIS — Z79899 Other long term (current) drug therapy: Secondary | ICD-10-CM | POA: Diagnosis not present

## 2019-10-06 DIAGNOSIS — I2510 Atherosclerotic heart disease of native coronary artery without angina pectoris: Secondary | ICD-10-CM | POA: Diagnosis present

## 2019-10-06 DIAGNOSIS — Z7901 Long term (current) use of anticoagulants: Secondary | ICD-10-CM | POA: Diagnosis not present

## 2019-10-06 DIAGNOSIS — Z20822 Contact with and (suspected) exposure to covid-19: Secondary | ICD-10-CM | POA: Diagnosis present

## 2019-10-06 LAB — CBC
HCT: 33.7 % — ABNORMAL LOW (ref 39.0–52.0)
Hemoglobin: 11.3 g/dL — ABNORMAL LOW (ref 13.0–17.0)
MCH: 30.1 pg (ref 26.0–34.0)
MCHC: 33.5 g/dL (ref 30.0–36.0)
MCV: 89.9 fL (ref 80.0–100.0)
Platelets: 138 10*3/uL — ABNORMAL LOW (ref 150–400)
RBC: 3.75 MIL/uL — ABNORMAL LOW (ref 4.22–5.81)
RDW: 14.6 % (ref 11.5–15.5)
WBC: 6.8 10*3/uL (ref 4.0–10.5)
nRBC: 0 % (ref 0.0–0.2)

## 2019-10-06 LAB — BASIC METABOLIC PANEL
Anion gap: 7 (ref 5–15)
BUN: 12 mg/dL (ref 8–23)
CO2: 25 mmol/L (ref 22–32)
Calcium: 8.4 mg/dL — ABNORMAL LOW (ref 8.9–10.3)
Chloride: 107 mmol/L (ref 98–111)
Creatinine, Ser: 0.73 mg/dL (ref 0.61–1.24)
GFR calc Af Amer: >60 mL/min (ref >60)
GFR calc non Af Amer: >60 mL/min (ref >60)
Glucose, Bld: 120 mg/dL — ABNORMAL HIGH (ref 70–99)
Potassium: 3.7 mmol/L (ref 3.5–5.1)
Sodium: 139 mmol/L (ref 135–145)

## 2019-10-06 LAB — GLUCOSE, CAPILLARY
Glucose-Capillary: 111 mg/dL — ABNORMAL HIGH (ref 70–99)
Glucose-Capillary: 106 mg/dL — ABNORMAL HIGH (ref 70–99)

## 2019-10-06 MED ORDER — PEG 3350-KCL-NA BICARB-NACL 420 G PO SOLR: 4000.0000 mL | Freq: Once | ORAL | Status: DC

## 2019-10-06 MED ORDER — PEG 3350-KCL-NA BICARB-NACL 420 G PO SOLR
2000.0000 mL | Freq: Once | ORAL | Status: AC
  Administered 2019-10-06: 4000 mL via ORAL
  Filled 2019-10-06 (×2): qty 4000

## 2019-10-06 MED ORDER — PEG 3350-KCL-NA BICARB-NACL 420 G PO SOLR
2000.0000 mL | Freq: Once | ORAL | Status: DC
  Filled 2019-10-06: qty 4000

## 2019-10-06 MED ORDER — PEG 3350-KCL-NA BICARB-NACL 420 G PO SOLR: 1000.0000 mL | Freq: Once | ORAL | Status: DC

## 2019-10-06 MED ORDER — PEG 3350-KCL-NA BICARB-NACL 420 G PO SOLR
1000.0000 mL | Freq: Once | ORAL | Status: AC
  Administered 2019-10-07: 1000 mL via ORAL

## 2019-10-06 MED ORDER — PEG 3350-KCL-NA BICARB-NACL 420 G PO SOLR: 2000.0000 mL | Freq: Once | ORAL | Status: DC

## 2019-10-06 NOTE — Hospital Course (Addendum)
GI Bleed 2/2 colonic mass concerning for malignancy [name] is a 69 year old man presenting with GI bleed x1 week. Has not had eliquis (for A fib) since 4/29. Asymptomatic, hgb stable above 10.  GI was consulted and performed colonoscopy that revealed a large, fungating, ulcerated, polypoid mass in cecum, most likely a malignancy. Biopsies were taken and showed ***. CEA WNL at 3.9. CT chest/abd/pelv showed mass discussed above as well as right ileocolic mesenteric lymphadenopathy compatible with metastic disease, but no metastases to the liver or lungs were found. Surgery consulted and performed R colectomy ***. Patient recovering well *** [Any complications]. Oncology consulted ***  HFrEF, EF 30-35% Non-ischemic cardiomyopathy, thought most likely to be due to tachycardia. Last Echo 10/2018 with LVEF 30-35%. NYHA class I, no SOB when able to afford and take [name] (currently he is able to afford it). Patient had heart cath in 2015 that showed moderate diffuse atherosclerosis in the LAD artery without significant stenosis, otherwise widely patent cardiac arteries. Home medications include Entresto 24-26 mg twice daily, furosemide 40 mg twice daily, Lopressor 100 mg twice daily. Cardiology consulted for optimization prior to surgery: acceptable risk for planned procedure without additional cardiac work up. How did patient do after surgery?***  A Fib HR was controlled on home dose of lopressor. Previously converted in 2015 but NSR did not hold, is rate controlled with Lopressor 100 mg twice daily, takes Eliquis 5 mg twice daily.  Last dose of Eliquis was 4/29 due to rectal bleeding.  CHA2DS2-VASc score of 4 (age, CHF, HTN, moderate non-obstructing atherosclerosis in LAD). Anticoagulation not restarted until *** due to risk of bleeding. SCDs used while in hospital for dvt ppx.  Groin rash Patient has pruritic scaling plaque along intertriginous zones of inguinal folds and gluteal cleft present for  greater than 1 year. Most likely tinea cruris, treated with antifungal cream.  Patient states that he feels great relief with the application of this antifungal cream.  Follow Up Issues  Please reevaluate patient's metoprolol tartrate and consider change to succinate  Scattered pulmonary nodules in both lungs, largest 22mm. Patient is non smoker. 3 month CT chest follow up. Thrombocytopenia: platelets have been intermittently low during this hospital stay to 138, 148. Stable at *** on day of discharge. Previously noted thrombocytopenia to 103 on lab work in 2020. Recommend outpatient follow up.

## 2019-10-06 NOTE — Discharge Summary (Addendum)
[facility] Discharge Summary  Patient name: [name] Medical record number: [id number] Date of birth: [birth year] Age: 69 y.o. 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  No acute distress. Cardiovascular: Irregularly irregular rhythm.  Normal rate.  No murmurs. Respiratory: Lungs clear to auscultation bilaterally. Abdomen: Soft, mildly tender at insiscion sites, ND.  Normal bowel sounds. Derm: honeycomb dressing clean, dry, intact on abdomen Extremities: No swelling.   Brief Hospital Course:  GI Bleed 2/2 colonic malignancy [name] is a 69 year old man presenting with GI bleed x1 week. He had not had eliquis (for A fib) since 4/29. Asymptomatic, hgb stable above 10.  GI was consulted and performed colonoscopy that revealed a large, fungating, ulcerated, polypoid mass in cecum, most likely a malignancy. 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 Ki-67 was approximately 1% c/w a low grade per report. Similarly, pathology revealed a tubular adenoma with high grade dysplasia at the polypectomy site of descending colon. Stalk margin was negative for dysplasia. CEA WNL at 3.9. CT chest/abd/pelv showed mass discussed above as well as right ileocolic mesenteric lymphadenopathy  compatible with metastic disease, but no metastases to the liver or lungs were found. Surgery consulted and performed R colectomy with anastomosis on 5/10. Patient recovering well without any complications, required only tylenol for pain and tolerated a regular diet. Oncology consulted and patient will go for outpatient follow up at [facility] with Dr. Burney Gauze.  HFrEF, EF 30-35% Non-ischemic cardiomyopathy, thought most likely to be due to tachycardia. Last Echo 10/2018 with LVEF 30-35%. NYHA class I, no SOB when able to afford and take [name] (currently he is able to afford it). Patient had heart cath in 2015 that showed moderate diffuse atherosclerosis in the LAD artery without significant stenosis, otherwise widely patent cardiac arteries. Home medications include Entresto 24-26 mg twice daily, furosemide 40 mg twice daily, Lopressor 100 mg twice daily. Cardiology consulted for optimization prior to surgery: acceptable risk for planned procedure without additional cardiac work up. Patient did well after surgery, no exacerbations. Home lasix dose was restarted at discharge.  A Fib HR was controlled on home dose of lopressor. Previously converted in 2015 but NSR did not hold, is rate controlled with Lopressor 100 mg twice daily, takes Eliquis 5 mg twice daily.  Last dose of Eliquis was 4/29 due to rectal bleeding.  CHA2DS2-VASc score of 4 (age, CHF, HTN, moderate non-obstructing atherosclerosis in LAD). Anticoagulation restarted on 5/13 due to risk of bleeding with GI bleed and surgery. SCDs used for majority of hospital stay for dvt ppx.  Groin rash Patient has pruritic scaling plaque along intertriginous zones of inguinal folds and gluteal cleft present for greater than 1 year. Most likely tinea cruris, treated with antifungal cream.  Patient states that he feels great relief with the application of this antifungal cream.  Pulmonary nodules Few scattered pulmonary nodule in both  lungs, largest 73m.  Pt is a reported nonsmoker.  Radiology recommends 3 month CT chest follow up.   Thrombocytopenia PlateletsWNL at 170 upon admission, reduced to 138on 5/6, stable today at 194. Previously thrombocytopenia noted in January 2020 to 103. Will continue to monitor, recommend outpatient follow up.   Issues for Follow Up:  1. Oncology: f/u for medical oncology post surgery for chemotherapy recommendations 2. Please reevaluate patient's metoprolol tartrate and consider change to succinate  3. Scattered pulmonary nodules in both lungs, largest 377m Patient is non smoker. 3 month CT chest follow up. 4. Thrombocytopenia: platelets have been intermittently low during this hospital stay to 138, 148. Stable at 184 on day of discharge. Previously noted thrombocytopenia to 103 on lab work in 2020. Recommend outpatient follow up.  Significant Procedures: R colectomy with anastomosis  Significant Labs and Imaging:  Recent Labs  Lab 10/11/19 0315 10/12/19 0703 10/13/19 0301  WBC 9.0 10.0 7.9  HGB 10.5* 11.4* 11.3*  HCT 31.5* 34.5* 34.5*  PLT 167 194 184   Recent Labs  Lab 10/09/19 0559 10/09/19 0559 10/10/19 0739 10/10/19 0739 10/11/19 0315 10/11/19 0315 10/12/19 0703 10/13/19 0301  NA 138  --  139  --  139  --  136 137  K 4.1   < > 4.0   < > 3.7   < > 3.9 3.9  CL 105  --  107  --  110  --  104 105  CO2 25  --  24  --  22  --  23 22  GLUCOSE 108*  --  108*  --  120*  --  116* 131*  BUN 7*  --  7*  --  6*  --  7* 6*  CREATININE 0.76  --  0.82  --  0.67  --  0.64 0.63  CALCIUM 8.6*  --  8.5*  --  8.1*  --  8.5* 8.5*   < > = values in this interval not displayed.   CT CHEST, ABDOMEN, AND PELVIS WITH CONTRAST Cardiovascular: Normal heart size. Left anterior descending and left circumflex coronary atherosclerosis. No pericardial effusion. Atherosclerotic nonaneurysmal thoracic aorta. Normal caliber pulmonary arteries. No central pulmonary emboli.  Mediastinum/Nodes:  No discrete thyroid nodules. Unremarkable esophagus. No pathologically enlarged axillary, mediastinal or hilar lymph nodes.  Lungs/Pleura: No pneumothorax. No pleural effusion. No acute consolidative airspace disease or lung masses. A few scattered tiny solid pulmonary nodules in both lungs, largest 3 mm in the posterior right upper lobe (series 5/image 68).  Musculoskeletal:  No aggressive appearing focal osseous lesions.  CT ABDOMEN PELVIS FINDINGS Hepatobiliary: Normal liver with no liver mass. Cholecystectomy. No biliary ductal dilatation.  Pancreas: Normal, with no mass or duct dilation.  Spleen: Normal size. No mass.  Adrenals/Urinary Tract: Normal adrenals. No hydronephrosis. Subcentimeter hypodense renal cortical lesions scattered in both kidneys are too small to characterize and require no follow-up. Normal nondistended bladder.  Stomach/Bowel: Normal non-distended stomach. Normal caliber small bowel with no small bowel wall thickening. Normal appendix. Irregular annular colonic mass in the cecum/ascending colon centered on the ileocecal valve region measuring approximately 6.1 cm in length (series 7/image 57). Mild sigmoid diverticulosis. No additional sites of large bowel wall thickening.  Vascular/Lymphatic: Atherosclerotic nonaneurysmal abdominal aorta. Patent portal, splenic, hepatic and renal veins. Multiple clustered right ileocolic mesenteric nodes measuring up to 2.1 cm short axis diameter (series 3/image 83). No additional pathologically enlarged lymph nodes in the abdomen  or pelvis.  Reproductive: Top-normal size prostate with coarse nonspecific internal prostatic calcifications.  Other: No pneumoperitoneum, ascites or focal fluid collection.  Musculoskeletal: No aggressive appearing focal osseous lesions. Healed deformities in the bilateral pubic rami and right iliac bone and right sacrum. Moderate lumbar spondylosis.  IMPRESSION: 1.  Irregular annular colonic mass in the cecum/ascending colon centered on the ileocecal valve region, compatible with primary colonic malignancy. No bowel obstruction. 2. Right ileocolic mesenteric lymphadenopathy compatible with metastatic disease. No additional findings highly suspicious for metastatic disease. 3. Few scattered tiny pulmonary nodules in both lungs, largest 3 mm. Recommend attention on follow-up chest CT in 3 months. 4. Coronary atherosclerosis. 5. Aortic Atherosclerosis (ICD10-I70.0).  Results/Tests Pending at Time of Discharge: none  Discharge Medications:  Allergies as of 10/13/2019   No Known Allergies     Medication List    STOP taking these medications   bisacodyl 5 MG EC tablet Commonly known as: DULCOLAX     TAKE these medications   Acetaminophen 500 MG capsule Take 500-1,000 mg by mouth every 6 (six) hours as needed (headaches or body aches).   amLODipine 5 MG tablet Commonly known as: NORVASC TAKE 1 TABLET BY MOUTH  DAILY What changed: when to take this   atorvastatin 40 MG tablet Commonly known as: LIPITOR TAKE 1 TABLET BY MOUTH AT  BEDTIME   CENTRUM SILVER 50+MEN PO Take 1 tablet by mouth daily.   clotrimazole 1 % cream Commonly known as: LOTRIMIN Apply topically 2 (two) times daily.   Eliquis 5 MG Tabs tablet Generic drug: apixaban TAKE 1 TABLET BY MOUTH  TWICE DAILY   Entresto 24-26 MG Generic drug: sacubitril-valsartan TAKE 1 TABLET BY MOUTH  TWICE DAILY   furosemide 40 MG tablet Commonly known as: LASIX Take 1 tablet (40 mg total) by mouth daily. What changed: when to take this   levocetirizine 5 MG tablet Commonly known as: XYZAL Take 5 mg by mouth at bedtime as needed for allergies (or sinus issues).   metoprolol tartrate 100 MG tablet Commonly known as: LOPRESSOR TAKE 1 TABLET BY MOUTH  TWICE DAILY   OVER THE COUNTER MEDICATION Take 1-2 tablets by mouth See admin instructions. Unnamed allergy/sinus tablet: Take 1-2  tablets by mouth once a day for allergies, rhinitis, or stopped up ears   oxyCODONE 5 MG immediate release tablet Commonly known as: Oxy IR/ROXICODONE Take 1 tablet (5 mg total) by mouth every 6 (six) hours as needed for breakthrough pain.   VITAMIN D PO Take 1 tablet by mouth daily.       Discharge Instructions: Please refer to Patient Instructions section of EMR for full details.  Patient was counseled important signs and symptoms that should prompt return to medical care, changes in medications, dietary instructions, activity restrictions, and follow up appointments.   Follow-Up Appointments: Follow-up Information    Jettie Booze, MD. Call in 1 day(s).   Specialties: Cardiology, Radiology, Interventional Cardiology Contact information: [address] [place] [address] [phone]        Kinsinger, Arta Bruce, MD. Go on 11/02/2019.   Specialty: General Surgery Why: '@11am'$ . Please arrive 30 minutes prior to your appointment for paperwork. Please bring a copy of your photo ID and insurance card.  Contact information: [place] [address] [phone]        Surgery, [place]. Go on 10/24/2019.   Specialty: General Surgery Why: 915am. This will be a nurse visit for staple  removal. Please arrive 30 minutes prior to your appointment for paperwork. Please bring a copy of your photo ID and insurance card.  Contact information: [address] [place] [address] [phone]        Lurline Del, DO. Go on 10/17/2019.   Specialty: Family Medicine Why: Go to appt at 9:15 AM. Please arrive 15 mins early Contact information: [address] [place] [address] [phone]           Gladys Damme, MD 10/13/2019, 9:21 PM PGY-1, [place]

## 2019-10-06 NOTE — Progress Notes (Signed)
Pt called writer to the room and explained " I had a little accident and wet myself a little". Writer offered assistance in rinsing up as well as offered a nightgown to change. Pt states "nope I don't want to wear that nightgown. I was here before for 8 days and that thing messed me up". Writer attempted to help pt clean up again. Pt stated "that's ok I'll just stay in these pants. They're not that wet. I cant stand that nightgown and if you don't have any pants I'm not wearing nothing else".

## 2019-10-06 NOTE — Progress Notes (Signed)
Pt has refused lab work.

## 2019-10-06 NOTE — Progress Notes (Signed)
Cardiac telemetry called and stated leads were off; writer entered room stating the need to check  Cardiac leads. Pt states "I am upset with you. I requested a sleeping pill, I haven't slept in 2 days and here you are again. I wouldn't have asked for it if I knew I wouldn't be able to sleep!". Writer explained once again the reason for entering room and that periodically he may be woken for safety/health reasons. Pt rolled eyes. After reattaching leads writer left the room.
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
## 2019-10-06 NOTE — Plan of Care (Signed)
  Problem: Education: Goal: Knowledge of General Education information will improve Description Including pain rating scale, medication(s)/side effects and non-pharmacologic comfort measures Outcome: Progressing   Problem: Clinical Measurements: Goal: Will remain free from infection Outcome: Progressing Goal: Cardiovascular complication will be avoided Outcome: Progressing   Problem: Activity: Goal: Risk for activity intolerance will decrease Outcome: Progressing   

## 2019-10-06 NOTE — Progress Notes (Signed)
Family Medicine Teaching Service Daily Progress Note Intern Pager: [phone]  Patient name: [name] Medical record number: [id number] Date of birth: 03-28-1951 Age: 69 y.o. Gender: male  Primary Care Provider: Patient, No Pcp Per Consultants: GI Code Status: Full  Pt Overview and Major Events to Date:  5/5 admitted for GI bleed, hgb 11.4  Assessment and Plan: MARSH BEDOY is a 69 y.o. male presenting with rectal bleeding. PMH is significant for A. Fib, HTN, HLD, CHF, OSA.  GI Bleed Jante Valbuena is a 69 year old man presenting with GI bleed x1 week. Has not has eliquis (for A fib) for 1 week. Overnight patient had no further bleeding with stools. Hgb had dropped from 15 to 11.4, stable at 11.3 today. Awaiting GI consult this morning, which will determine next steps. Patient requires PCP, will place [facility] consult. - Admit for observation to [place], medical telemetry, attending Dr. Ardelia Mems - GI consulted, appreciate recs.   - Cardiac telemetry - Transfuse if hgb <8 - AM CBC, BMP - Currently NPO - SCDs for dvt ppx  A Fib Previously converted in 2015 but NSR did not hold, is rate controlled with Lopressor 100 mg twice daily, takes Eliquis 5 mg twice daily.  Last dose of Eliquis was Thursday due to rectal bleeding. Currently in A fib per ekg and telemetry, rate controlled with HR of 89. CHA2DS2-VASc score of 4 (age, CHF, HTN, moderate non-obstructing atherosclerosis in LAD).  - continue lopressor BID - hold eliquis pending GI bleed work up - cardiac monitoring  Thrombocytopenia Yesterday platelets WNL at 170, today reduced to 138. Previously thrombocytopenia noted in January 2020 to 103. Will continue to monitor, recommend outpatient follow up.  -AM CBC  HFrEF, EF 30-35% Non-ischemic cardiomyopathy, thought most likely to be due to tachycardia. Last Echo 10/2018 with LVEF 30-35%. NYHA class I, no SOB when able to afford and take [name] (currently he is able to afford  it). Patient had heart cath in 2015 that showed moderate diffuse atherosclerosis in the LAD artery without significant stenosis, otherwise widely patent cardiac arteries. Home medications include Entresto 24-26 mg twice daily, furosemide 40 mg twice daily, Lopressor 100 mg twice daily. Patient appears euvolemic.  - Continue home medications: lopressor, entresto - Hold lasix in setting of GI bleed  Groin rash Patient has pruritic scaling plaque along intertriginous zones of inguinal folds and gluteal cleft present for greater than 1 year. Will bleed when he scratches to much.  Patient uses shea butter on it without improvement. Most likely tinea cruris, will treat with antifungal cream. 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 No claudication or known PAD, remote h/o smoking, bilateral ABIs WNL in February, 2020. Stable.  HTN BP normotensive at 125/87. Home medication includes amlodipine 5 mg daily. - Continue amlodipine   HLD Lipid panel from 2019 with LDL 45, total cholesterol 110. Home medication includes atorvastatin 40 mg daily. - continue atorvastatin  OSA Patient has OSA and has a CPAP at home but has not been using it and does not want to use it.  FEN/GI: NPO PPx: SCDs  Disposition: in observation awaiting GI consult  Subjective:  Patient feels much better after sleeping, though has not had anything to eat in more than a day and anxious to hear if colonoscopy will happen this admission or not.   Objective: Temp:  [97.9 F (36.6 C)-98.3 F (36.8 C)] 97.9 F (36.6 C) (  05/06 0617) Pulse Rate:  [66-100] 75 (05/06 0617) Resp:  [12-20] 20 (05/06 0617) BP: (122-146)/(75-90) 125/87 (05/06 0617) SpO2:  [96 %-98 %] 97 % (05/06 0617) Weight:  [96.2 kg] 96.2 kg (05/05 1242) Physical Exam: General: obese, older man, age-appropriate, NAD Cardiovascular: regular rate, irregular rhythm, no  m/r/g Respiratory: CTAB, no increased WOB, no wheezes/rales/rhonchi Abdomen: soft, NT, distended, normal bowel sounds present Extremities: flaking pink plaque present in inguinal folds and gluteal crease  Laboratory: Recent Labs  Lab 10/05/19 1248  WBC 6.2  HGB 11.4*  HCT 34.3*  PLT 170   Recent Labs  Lab 10/05/19 1248  NA 140  K 4.1  CL 106  CO2 22  BUN 12  CREATININE 0.70  CALCIUM 8.9  PROT 6.4*  BILITOT 0.6  ALKPHOS 76  ALT 21  AST 23  GLUCOSE 104*    Imaging/Diagnostic Tests: No results found.  Gladys Damme, MD 10/06/2019, 6:41 AM PGY-1, [place] Intern pager: [phone], text pages welcome
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
## 2019-10-07 ENCOUNTER — Encounter (HOSPITAL_COMMUNITY)
Admission: EM | Disposition: A | Discharge status: Home / Self Care | Payer: Self-pay | Source: Home / Self Care | Attending: Family Medicine

## 2019-10-07 ENCOUNTER — Other Ambulatory Visit: Payer: Self-pay

## 2019-10-07 ENCOUNTER — Encounter (HOSPITAL_COMMUNITY): Payer: Self-pay | Admitting: Family Medicine

## 2019-10-07 ENCOUNTER — Inpatient Hospital Stay (HOSPITAL_COMMUNITY): Payer: Medicare Other | Admitting: Certified Registered Nurse Anesthetist

## 2019-10-07 ENCOUNTER — Inpatient Hospital Stay (HOSPITAL_COMMUNITY): Payer: Medicare Other

## 2019-10-07 DIAGNOSIS — I4811 Longstanding persistent atrial fibrillation: Secondary | ICD-10-CM

## 2019-10-07 DIAGNOSIS — I10 Essential (primary) hypertension: Secondary | ICD-10-CM

## 2019-10-07 DIAGNOSIS — I4891 Unspecified atrial fibrillation: Secondary | ICD-10-CM

## 2019-10-07 DIAGNOSIS — B356 Tinea cruris: Secondary | ICD-10-CM

## 2019-10-07 DIAGNOSIS — K6389 Other specified diseases of intestine: Secondary | ICD-10-CM

## 2019-10-07 HISTORY — PX: POLYPECTOMY: SHX5525

## 2019-10-07 HISTORY — PX: BIOPSY: SHX5522

## 2019-10-07 HISTORY — PX: SUBMUCOSAL TATTOO INJECTION: SHX6856

## 2019-10-07 HISTORY — PX: COLONOSCOPY WITH PROPOFOL: SHX5780

## 2019-10-07 LAB — CBC
HCT: 37.2 % — ABNORMAL LOW (ref 39.0–52.0)
Hemoglobin: 12.5 g/dL — ABNORMAL LOW (ref 13.0–17.0)
MCH: 30 pg (ref 26.0–34.0)
MCHC: 33.6 g/dL (ref 30.0–36.0)
MCV: 89.4 fL (ref 80.0–100.0)
Platelets: 151 10*3/uL (ref 150–400)
RBC: 4.16 MIL/uL — ABNORMAL LOW (ref 4.22–5.81)
RDW: 14.5 % (ref 11.5–15.5)
WBC: 7.9 10*3/uL (ref 4.0–10.5)
nRBC: 0 % (ref 0.0–0.2)

## 2019-10-07 LAB — BASIC METABOLIC PANEL
Anion gap: 8 (ref 5–15)
BUN: 10 mg/dL (ref 8–23)
CO2: 22 mmol/L (ref 22–32)
Calcium: 8.5 mg/dL — ABNORMAL LOW (ref 8.9–10.3)
Chloride: 106 mmol/L (ref 98–111)
Creatinine, Ser: 0.76 mg/dL (ref 0.61–1.24)
GFR calc Af Amer: >60 mL/min (ref >60)
GFR calc non Af Amer: >60 mL/min (ref >60)
Glucose, Bld: 119 mg/dL — ABNORMAL HIGH (ref 70–99)
Potassium: 3.7 mmol/L (ref 3.5–5.1)
Sodium: 136 mmol/L (ref 135–145)

## 2019-10-07 SURGERY — COLONOSCOPY WITH PROPOFOL
Anesthesia: Monitor Anesthesia Care

## 2019-10-07 MED ORDER — LACTATED RINGERS IV SOLN: INTRAVENOUS | Status: DC

## 2019-10-07 MED ORDER — ONDANSETRON HCL 4 MG/2ML IJ SOLN
INTRAMUSCULAR | Status: DC | PRN
  Administered 2019-10-07: 4 mg via INTRAVENOUS

## 2019-10-07 MED ORDER — PROPOFOL 500 MG/50ML IV EMUL
INTRAVENOUS | Status: DC | PRN
  Administered 2019-10-07: 125 ug/kg/min via INTRAVENOUS

## 2019-10-07 MED ORDER — SPOT INK MARKER SYRINGE KIT
PACK | SUBMUCOSAL | Status: AC
  Filled 2019-10-07: qty 5

## 2019-10-07 MED ORDER — LACTATED RINGERS IV SOLN: INTRAVENOUS | Status: DC | PRN

## 2019-10-07 MED ORDER — CETIRIZINE HCL 10 MG PO TABS
10.0000 mg | ORAL_TABLET | Freq: Every day | ORAL | Status: DC
  Administered 2019-10-07 – 2019-10-13 (×7): 10 mg via ORAL
  Filled 2019-10-07 (×7): qty 1

## 2019-10-07 MED ORDER — PROPOFOL 10 MG/ML IV BOLUS
INTRAVENOUS | Status: DC | PRN
  Administered 2019-10-07: 20 mg via INTRAVENOUS

## 2019-10-07 MED ORDER — SODIUM CHLORIDE 0.9 % IV SOLN: INTRAVENOUS | Status: DC

## 2019-10-07 MED ORDER — ONDANSETRON 4 MG PO TBDP: 4.0000 mg | ORAL_TABLET | Freq: Three times a day (TID) | ORAL | Status: DC | PRN

## 2019-10-07 MED ORDER — IOHEXOL 300 MG/ML  SOLN
100.0000 mL | Freq: Once | INTRAMUSCULAR | Status: AC | PRN
  Administered 2019-10-07: 100 mL via INTRAVENOUS

## 2019-10-07 MED ORDER — SPOT INK MARKER SYRINGE KIT
PACK | SUBMUCOSAL | Status: DC | PRN
  Administered 2019-10-07: 3.5 mL via SUBMUCOSAL

## 2019-10-07 MED ORDER — SALINE SPRAY 0.65 % NA SOLN
1.0000 | NASAL | Status: DC | PRN
  Administered 2019-10-07: 1 via NASAL
  Filled 2019-10-07: qty 44

## 2019-10-07 SURGICAL SUPPLY — 22 items

## 2019-10-07 NOTE — Transfer of Care (Signed)
Immediate Anesthesia Transfer of Care Note  Patient: [name]  Procedure(s) Performed: COLONOSCOPY WITH PROPOFOL (N/A ) BIOPSY POLYPECTOMY SUBMUCOSAL TATTOO INJECTION  Patient Location: PACU and Endoscopy Unit  Anesthesia Type:MAC  Level of Consciousness: awake, alert  and oriented  Airway & Oxygen Therapy: Patient Spontanous Breathing  Post-op Assessment: Report given to RN and Post -op Vital signs reviewed and stable  Post vital signs: Reviewed and stable  Last Vitals:  Vitals Value Taken Time  BP 127/79 10/07/19 1014  Temp 36.3 C 10/07/19 1014  Pulse 47 10/07/19 1015  Resp 21 10/07/19 1015  SpO2 89 % 10/07/19 1015  Vitals shown include unvalidated device data.  Last Pain:  Vitals:   10/07/19 1014  TempSrc: Temporal  PainSc: 0-No pain         Complications: No apparent anesthesia complications

## 2019-10-07 NOTE — Anesthesia Postprocedure Evaluation (Signed)
Anesthesia Post Note  Patient: [name]  Procedure(s) Performed: COLONOSCOPY WITH PROPOFOL (N/A ) BIOPSY POLYPECTOMY SUBMUCOSAL TATTOO INJECTION     Patient location during evaluation: PACU Anesthesia Type: MAC Level of consciousness: awake and alert Pain management: pain level controlled Vital Signs Assessment: post-procedure vital signs reviewed and stable Respiratory status: spontaneous breathing, nonlabored ventilation, respiratory function stable and patient connected to nasal cannula oxygen Cardiovascular status: stable and blood pressure returned to baseline Postop Assessment: no apparent nausea or vomiting Anesthetic complications: no    Last Vitals:  Vitals:   10/07/19 1025 10/07/19 1101  BP: 122/84 (!) 134/94  Pulse: 84 80  Resp: 19 18  Temp:  36.4 C  SpO2: 96% 98%    Last Pain:  Vitals:   10/07/19 1101  TempSrc: Axillary  PainSc: 0-No pain                 Effie Berkshire

## 2019-10-07 NOTE — Interval H&P Note (Signed)
History and Physical Interval Note: 68/male with hematochezia for a colonoscopy today.  10/07/2019 8:03 AM  [name]  has presented today for colonoscopy, with the diagnosis of rectal bleeding.  The various methods of treatment have been discussed with the patient and family. After consideration of risks, benefits and other options for treatment, the patient has consented to  Procedure(s): COLONOSCOPY WITH PROPOFOL (N/A) as a surgical intervention.  The patient's history has been reviewed, patient examined, no change in status, stable for surgery.  I have reviewed the patient's chart and labs.  Questions were answered to the patient's satisfaction.     Ronnette Juniper

## 2019-10-07 NOTE — Anesthesia Preprocedure Evaluation (Signed)
Anesthesia Evaluation  Patient identified by MRN, date of birth, ID band Patient awake    Reviewed: Allergy & Precautions, NPO status , [name] Chart, lab work & pertinent test results  Airway Mallampati: I  TM Distance: >3 FB Neck ROM: Full    Dental  (+) Edentulous Upper, Poor Dentition   Pulmonary sleep apnea , former smoker,    breath sounds clear to auscultation       Cardiovascular hypertension, Pt. on medications and Pt. on home beta blockers +CHF   Rhythm:Regular Rate:Normal     Neuro/Psych negative neurological ROS  negative psych ROS   GI/Hepatic Neg liver ROS, PUD,   Endo/Other  Hypothyroidism   Renal/GU negative Renal ROS     Musculoskeletal  (+) Arthritis ,   Abdominal Normal abdominal exam  (+)   Peds  Hematology negative hematology ROS (+)   Anesthesia Other Findings   Reproductive/Obstetrics                             Anesthesia Physical Anesthesia Plan  ASA: III  Anesthesia Plan: General   Post-op Pain Management:    Induction: Intravenous, Rapid sequence and Cricoid pressure planned  PONV Risk Score and Plan: 0  Airway Management Planned: Oral ETT  Additional Equipment: None  Intra-op Plan:   Post-operative Plan: Extubation in OR  Informed Consent: I have reviewed the patients History and Physical, chart, labs and discussed the procedure including the risks, benefits and alternatives for the proposed anesthesia with the patient or authorized representative who has indicated his/her understanding and acceptance.     Dental advisory given  Plan Discussed with: CRNA  Anesthesia Plan Comments: (Echo:  1. The left ventricle has moderate-severely reduced systolic function,  with an ejection fraction of 30-35%. The cavity size was severely dilated.  Left ventricular diastolic function could not be evaluated secondary to  atrial fibrillation. Left   ventricular diffuse hypokinesis.  2. The right ventricle has normal systolic function. The cavity was  normal. There is no increase in right ventricular wall thickness.  3. Left atrial size was severely dilated.  4. There is moderate mitral annular calcification present. Mitral valve  regurgitation is moderate by color flow Doppler. The MR jet is  posteriorly-directed.  5. The aortic valve is tricuspid. Mild sclerosis of the aortic valve.  6. The aorta is normal unless otherwise noted. )        Anesthesia Quick Evaluation

## 2019-10-07 NOTE — Op Note (Signed)
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  1 pedunculated polyp removed from descending colon. Multiple diverticulosis noted. Internal hemorrhoids noted.  Recommendations: I have discussed this case with the surgical PA as patient will need surgery for removal of the mass. We will keep him n.p.o., as he had a colonic prep for colonoscopy, timing for surgery to be decided by the surgical team, diet advancement as per surgery. Patient may benefit from CT of the chest, abdomen and pelvis and CEA levels. Will need an oncology consult after biopsies are back.  Ronnette Juniper, MD
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
## 2019-10-07 NOTE — Progress Notes (Signed)
CCMD notified RN about patient having 6 beats of V-tach,assessment done stable. Denies any acute distress. Will cont. to monitor.

## 2019-10-07 NOTE — Progress Notes (Signed)
Family Medicine Teaching Service Daily Progress Note Intern Pager: [phone]  Patient name: [name] Medical record number: [id number] Date of birth: [birth year] Age: 69 y.o. Gender: male  Primary Care Provider: Patient, No Pcp Per Consultants: GI Code Status: Full  Pt Overview and Major Events to Date:  5/5 admitted for GI bleed, hgb 11.4  Assessment and Plan: [name] is a 69 y.o. male presenting with rectal bleeding. PMH is significant for A. Fib, HTN, HLD, CHF, OSA.  GI Bleed  Mass in colon  [name] is a 69 year old man presenting with GI bleed x1 week. Has not has eliquis (for A fib) since 4/29. Patient had not previously had anemia, hgb 11.4 on admission, stable today at 12.5. GI was consulted and performed colonoscopy this morning. 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 Biopsies were taken and have been sent to pathology. Surgery has been consulted, appreciate their recommendations. They plan to resect this lesion either this weekend or early next week, and recommend a CLD to avoid a repeat bowel prep. CT chest/abdomen/pelvis ordered prior to surgical intervention. CEA pending. - GI consulted, appreciate recs.   - Surgery consulted, appreciate recs - CEA pending - CT chest, abdomen, pelvis - Cardiac telemetry - Transfuse if hgb <8 - AM CBC, BMP - CLD - SCDs for dvt ppx -F/U surgical pathology  A Fib Previously converted in 2015 but NSR did not hold, is rate controlled with Lopressor 100 mg twice daily, takes Eliquis 5 mg twice daily.  Last dose of Eliquis was Thursday due to rectal bleeding. Currently in A fib per ekg and telemetry, rate controlled with HR of 89. CHA2DS2-VASc score of 4 (age, CHF, HTN, moderate non-obstructing atherosclerosis in LAD).  - continue lopressor BID - hold eliquis pending GI bleed work up - cardiac monitoring  Thrombocytopenia-  Improved Platelets WNL at 170 upon admission, reduced to 138 on 5/6, today improved to 151. Previously thrombocytopenia noted in January 2020 to 103. Will continue to monitor, recommend outpatient follow up.  -AM CBC  HFrEF, EF 30-35% Non-ischemic cardiomyopathy, thought most likely to be due to tachycardia. Last Echo 10/2018 with LVEF 30-35%. NYHA class I, no SOB when able to afford and take [name] (currently he is able to afford it). Patient had heart cath in 2015 that showed moderate diffuse atherosclerosis in the LAD artery without significant stenosis, otherwise widely patent cardiac arteries. Home medications include Entresto 24-26 mg twice daily, furosemide 40 mg twice daily, Lopressor 100 mg twice daily. Patient appears euvolemic.  - Continue home medications: lopressor, entresto - Hold lasix in setting of GI bleed  Groin rash Patient has pruritic scaling plaque along intertriginous zones of inguinal folds and gluteal cleft present for greater than 1 year. Will bleed when he scratches to much.  Patient uses shea butter on it without improvement. Most likely tinea cruris, will treat with antifungal cream. - Clotrimazole cream to affected areas   Sinus pressure, L ear fullness Patient reports seasonal allergies. Takes xyzal at home which he states relieves sinus pressure and feeling of fullness he regularly experiences in L ear. Unfortunately, the hospital formulary does not carry xyzal, also called levocetirizine. However, we do have cetirizine, and will try this and nasal saline for comfort. 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 No claudication or known PAD, remote h/o smoking, bilateral ABIs WNL in February,  2020. Stable.  HTN SBP ranged from 118-141, DBP 72-87. Most recently normotensive at 131/87. Home medication includes amlodipine 5 mg daily. - Continue amlodipine   HLD Lipid panel  from 2019 with LDL 45, total cholesterol 110. Home medication includes atorvastatin 40 mg daily. - continue atorvastatin  OSA Patient has OSA and has a CPAP at home but has not been using it and does not want to use it.  FEN/GI: NPO PPx: SCDs  Disposition: admitted to med-surg pending surgical intervention for likely malignant colonic mass  Subjective:  Patient feeling upset but hopeful after colonoscopy results  Objective: Temp:  [97.7 F (36.5 C)-97.8 F (36.6 C)] 97.8 F (36.6 C) (05/07 0525) Pulse Rate:  [73-90] 83 (05/07 0525) Resp:  [18] 18 (05/07 0525) BP: (118-141)/(72-87) 131/87 (05/07 0525) SpO2:  [96 %-97 %] 96 % (05/07 0525) Physical Exam: General: obese, older man, age-appropriate, NAD Cardiovascular: regular rate, irregular rhythm, no m/r/g Respiratory: CTAB, no increased WOB, no wheezes/rales/rhonchi Abdomen: soft, NT, distended, normal bowel sounds present Extremities: flaking pink plaque present in inguinal folds and gluteal crease  Laboratory: Recent Labs  Lab 10/05/19 1248 10/06/19 0616  WBC 6.2 6.8  HGB 11.4* 11.3*  HCT 34.3* 33.7*  PLT 170 138*   Recent Labs  Lab 10/05/19 1248 10/06/19 0616  NA 140 139  K 4.1 3.7  CL 106 107  CO2 22 25  BUN 12 12  CREATININE 0.70 0.73  CALCIUM 8.9 8.4*  PROT 6.4*  --   BILITOT 0.6  --   ALKPHOS 76  --   ALT 21  --   AST 23  --   GLUCOSE 104* 120*    Imaging/Diagnostic Tests: No results found.  Gladys Damme, MD 10/07/2019, 6:44 AM PGY-1, [place] Intern pager: [phone], text pages welcome

## 2019-10-07 NOTE — Brief Op Note (Signed)
10/05/2019 - 10/07/2019  10:18 AM  PATIENT:  Ronnie Doss  69 y.o. male  PRE-OPERATIVE DIAGNOSIS:  rectal bleeding  POST-OPERATIVE DIAGNOSIS:  Cecal mass, polypectomy  PROCEDURE:  Procedure(s): COLONOSCOPY WITH PROPOFOL (N/A) BIOPSY POLYPECTOMY SUBMUCOSAL TATTOO INJECTION  SURGEON:  Surgeon(s) and Role:    Ronnette Juniper, MD - Primary  PHYSICIAN ASSISTANT:   ASSISTANTS: Maggie Octaviano Batty  ANESTHESIA:   MAC  EBL:  Minimal  BLOOD ADMINISTERED:none  DRAINS: none   LOCAL MEDICATIONS USED:  NONE  SPECIMEN:  Biopsy / Limited Resection  DISPOSITION OF SPECIMEN:  PATHOLOGY  COUNTS:  YES  TOURNIQUET:  * No tourniquets in log *  DICTATION: .Dragon Dictation  PLAN OF CARE: Admit to inpatient   PATIENT DISPOSITION:  PACU - hemodynamically stable.   Delay start of Pharmacological VTE agent (>24hrs) due to surgical blood loss or risk of bleeding: yes

## 2019-10-07 NOTE — Op Note (Signed)
[facility] Patient Name: [name] Procedure Date : 10/07/2019 MRN: [id number] Attending MD: Ronnette Juniper , MD Date of Birth: 08/17/1950 CSN: [id number] Age: 69 Admit Type: Outpatient Procedure:                Colonoscopy Indications:              This is the patient's first colonoscopy, Rectal                            bleeding Providers:                Ronnette Juniper, MD, Josie Dixon, RN, Clyde Lundborg,                            RN, Lazaro Arms, Technician Referring MD:             [facility] Medicines:                Monitored Anesthesia Care Complications:            No immediate complications. Estimated Blood Loss:     Estimated blood loss was minimal. Procedure:                Pre-Anesthesia Assessment:                           - Prior to the procedure, a History and Physical                            was performed, and patient medications and                            allergies were reviewed. The patient's tolerance of                            previous anesthesia was also reviewed. The risks                            and benefits of the procedure and the sedation                            options and risks were discussed with the patient.                            All questions were answered, and informed consent                            was obtained. Prior Anticoagulants: The patient has                            taken Eliquis (apixaban), last dose was 7 days                            prior to procedure. ASA Grade Assessment: III - A  patient with severe systemic disease. After                            reviewing the risks and benefits, the patient was                            deemed in satisfactory condition to undergo the                            procedure.                           After obtaining informed consent, the colonoscope                            was passed under direct vision. Throughout the                             procedure, the patient's blood pressure, pulse, and                            oxygen saturations were monitored continuously. The                            [id number] Olympus pediatric colonscope                            was introduced through the anus and advanced to the                            the cecum, identified by appendiceal orifice and                            ileocecal valve. The colonoscopy was performed                            without difficulty. The patient tolerated the                            procedure well. The quality of the bowel                            preparation was good. Scope In: 9:39:12 AM Scope Out: 10:06:41 AM Scope Withdrawal Time: 0 hours 20 minutes 16 seconds  Total Procedure Duration: 0 hours 27 minutes 29 seconds  Findings:      Hemorrhoids were found on perianal exam.      A 15 mm polyp was found in the descending colon. The polyp was       pedunculated. The polyp was removed with a hot snare. Resection and       retrieval were complete.      A frond-like/villous, fungating, infiltrative, polypoid, sessile and       ulcerated non-obstructing large mass was found in the ascending colon       and in the cecum. The mass was partially circumferential (involving  one-half of the lumen circumference). No bleeding was present. This was       biopsied with a cold forceps for histology. Area was tattooed with an       injection of 3 mL of [place] ink.      Multiple small and large-mouthed diverticula were found in the sigmoid       colon and descending colon.      Non-bleeding internal hemorrhoids were found during retroflexion. The       hemorrhoids were medium-sized. Impression:               - Hemorrhoids found on perianal exam.                           - One 15 mm polyp in the descending colon, removed                            with a hot snare. Resected and retrieved.                           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 Biopsied. Tattooed.                           - Diverticulosis in the sigmoid colon and in the                            descending colon.                           - Non-bleeding internal hemorrhoids. Moderate Sedation:      Patient did not receive moderate sedation for this procedure, but       instead received monitored anesthesia care. Recommendation:           - NPO.                           - Continue present medications.                           - Await pathology results.                           - Refer to a surgeon today. Procedure Code(s):        --- Professional ---                           [id number], Colonoscopy, flexible; with removal of                            tumor(s), polyp(s), or other lesion(s) by snare                            technique  45381, Colonoscopy, flexible; with directed                            submucosal injection(s), any substance                           [id number], 89, Colonoscopy, flexible; with biopsy,                            single or multiple Diagnosis Code(s):        --- Professional ---                           K64.8, Other hemorrhoids                           K63.5, Polyp of colon                           C18.2, Malignant neoplasm of ascending colon                           C18.0, Malignant neoplasm of cecum                           K62.5, Hemorrhage of anus and rectum                           K57.30, Diverticulosis of large intestine without                            perforation or abscess without bleeding CPT copyright 2019 American [employer] Association. All rights reserved. The codes documented in this report are preliminary and upon coder review may  be revised to meet current compliance requirements. Ronnette Juniper, MD 10/07/2019 10:17:50 AM This report has been signed electronically. Number of Addenda: 0

## 2019-10-08 DIAGNOSIS — Z01810 Encounter for preprocedural cardiovascular examination: Secondary | ICD-10-CM

## 2019-10-08 DIAGNOSIS — I4821 Permanent atrial fibrillation: Secondary | ICD-10-CM

## 2019-10-08 DIAGNOSIS — K6389 Other specified diseases of intestine: Secondary | ICD-10-CM

## 2019-10-08 LAB — CEA: CEA: 3.9 ng/mL (ref 0.0–4.7)

## 2019-10-08 MED ORDER — FLUTICASONE PROPIONATE 50 MCG/ACT NA SUSP
2.0000 | Freq: Every day | NASAL | Status: DC
  Administered 2019-10-10: 2 via NASAL
  Filled 2019-10-08: qty 16

## 2019-10-08 MED ORDER — GUAIFENESIN ER 600 MG PO TB12
600.0000 mg | ORAL_TABLET | Freq: Two times a day (BID) | ORAL | Status: DC
  Administered 2019-10-08 – 2019-10-13 (×10): 600 mg via ORAL
  Filled 2019-10-08 (×11): qty 1

## 2019-10-08 NOTE — Progress Notes (Signed)
Subjective: Patient states he has been having liquid clear watery stools. Has not noted any blood in stool. Denies abdominal pain, nausea or vomiting  Objective: Vital signs in last 24 hours: Temp:  [97.8 F (36.6 C)-98 F (36.7 C)] 98 F (36.7 C) (05/08 1232) Pulse Rate:  [68-95] 68 (05/08 1232) Resp:  [18-19] 18 (05/08 1232) BP: (97-116)/(66-68) 97/68 (05/08 1232) SpO2:  [96 %-97 %] 96 % (05/08 1232) Weight change:  Last BM Date: 10/05/19  PE: Sitting on bedside chair GENERAL: Alert, oriented x3, no pallor, no icterus ABDOMEN: Soft, nondistended, nontender, normoactive bowel sounds EXTREMITIES: No deformity, no edema  Lab Results: Results for orders placed or performed during the hospital encounter of 10/05/19 (from the past 48 hour(s))  CBC     Status: Abnormal   Collection Time: 10/07/19  6:54 AM  Result Value Ref Range   WBC 7.9 4.0 - 10.5 K/uL   RBC 4.16 (L) 4.22 - 5.81 MIL/uL   Hemoglobin 12.5 (L) 13.0 - 17.0 g/dL   HCT 37.2 (L) 39.0 - 52.0 %   MCV 89.4 80.0 - 100.0 fL   MCH 30.0 26.0 - 34.0 pg   MCHC 33.6 30.0 - 36.0 g/dL   RDW 14.5 11.5 - 15.5 %   Platelets 151 150 - 400 K/uL   nRBC 0.0 0.0 - 0.2 %    Comment: Performed at [facility], [place] [address] [place], [place] [id number]  Basic metabolic panel     Status: Abnormal   Collection Time: 10/07/19  6:54 AM  Result Value Ref Range   Sodium 136 135 - 145 mmol/L   Potassium 3.7 3.5 - 5.1 mmol/L   Chloride 106 98 - 111 mmol/L   CO2 22 22 - 32 mmol/L   Glucose, Bld 119 (H) 70 - 99 mg/dL    Comment: Glucose reference range applies only to samples taken after fasting for at least 8 hours.   BUN 10 8 - 23 mg/dL   Creatinine, Ser 0.76 0.61 - 1.24 mg/dL   Calcium 8.5 (L) 8.9 - 10.3 mg/dL   GFR calc non Af Amer >60 >60 mL/min   GFR calc Af Amer >60 >60 mL/min   Anion gap 8 5 - 15    Comment: Performed at [facility] [address], [place], [place] [address]  CEA     Status: None   Collection  Time: 10/07/19 12:08 PM  Result Value Ref Range   CEA 3.9 0.0 - 4.7 ng/mL    Comment: (NOTE)                             Nonsmokers          <3.9                             Smokers             <5.6 [employer] Electrochemiluminescence Immunoassay (ECLIA) Values obtained with different assay methods or kits cannot be used interchangeably.  Results cannot be interpreted as absolute evidence of the presence or absence of malignant disease. Performed At: [facility] [place], [place] [id number] Rush Farmer MD [id number]     Studies/Results: CT CHEST W CONTRAST  Result Date: 10/07/2019 CLINICAL DATA:  Inpatient. Rectal bleeding. Malignant appearing ascending colon mass detected on colonoscopy completed today. Staging evaluation. EXAM: CT CHEST, ABDOMEN,  AND PELVIS WITH CONTRAST TECHNIQUE: Multidetector CT imaging of the chest, abdomen and pelvis was performed following the standard protocol during bolus administration of intravenous contrast. CONTRAST:  17mL OMNIPAQUE IOHEXOL 300 MG/ML  SOLN COMPARISON:  None. FINDINGS: CT CHEST FINDINGS Cardiovascular: Normal heart size. Left anterior descending and left circumflex coronary atherosclerosis. No pericardial effusion. Atherosclerotic nonaneurysmal thoracic aorta. Normal caliber pulmonary arteries. No central pulmonary emboli. Mediastinum/Nodes: No discrete thyroid nodules. Unremarkable esophagus. No pathologically enlarged axillary, mediastinal or hilar lymph nodes. Lungs/Pleura: No pneumothorax. No pleural effusion. No acute consolidative airspace disease or lung masses. A few scattered tiny solid pulmonary nodules in both lungs, largest 3 mm in the posterior right upper lobe (series 5/image 68). Musculoskeletal:  No aggressive appearing focal osseous lesions. CT ABDOMEN PELVIS FINDINGS Hepatobiliary: Normal liver with no liver mass. Cholecystectomy. No biliary ductal dilatation. Pancreas: Normal, with no mass or  duct dilation. Spleen: Normal size. No mass. Adrenals/Urinary Tract: Normal adrenals. No hydronephrosis. Subcentimeter hypodense renal cortical lesions scattered in both kidneys are too small to characterize and require no follow-up. Normal nondistended bladder. Stomach/Bowel: Normal non-distended stomach. Normal caliber small bowel with no small bowel wall thickening. Normal appendix. Irregular annular colonic mass in the cecum/ascending colon centered on the ileocecal valve region measuring approximately 6.1 cm in length (series 7/image 57). Mild sigmoid diverticulosis. No additional sites of large bowel wall thickening. Vascular/Lymphatic: Atherosclerotic nonaneurysmal abdominal aorta. Patent portal, splenic, hepatic and renal veins. Multiple clustered right ileocolic mesenteric nodes measuring up to 2.1 cm short axis diameter (series 3/image 83). No additional pathologically enlarged lymph nodes in the abdomen or pelvis. Reproductive: Top-normal size prostate with coarse nonspecific internal prostatic calcifications. Other: No pneumoperitoneum, ascites or focal fluid collection. Musculoskeletal: No aggressive appearing focal osseous lesions. Healed deformities in the bilateral pubic rami and right iliac bone and right sacrum. Moderate lumbar spondylosis. IMPRESSION: 1. Irregular annular colonic mass in the cecum/ascending colon centered on the ileocecal valve region, compatible with primary colonic malignancy. No bowel obstruction. 2. Right ileocolic mesenteric lymphadenopathy compatible with metastatic disease. No additional findings highly suspicious for metastatic disease. 3. Few scattered tiny pulmonary nodules in both lungs, largest 3 mm. Recommend attention on follow-up chest CT in 3 months. 4. Coronary atherosclerosis. 5. Aortic Atherosclerosis (ICD10-I70.0). Electronically Signed   By: Ilona Sorrel M.D.   On: 10/07/2019 17:09   CT ABDOMEN PELVIS W CONTRAST  Result Date: 10/07/2019 CLINICAL DATA:   Inpatient. Rectal bleeding. Malignant appearing ascending colon mass detected on colonoscopy completed today. Staging evaluation. EXAM: CT CHEST, ABDOMEN, AND PELVIS WITH CONTRAST TECHNIQUE: Multidetector CT imaging of the chest, abdomen and pelvis was performed following the standard protocol during bolus administration of intravenous contrast. CONTRAST:  167mL OMNIPAQUE IOHEXOL 300 MG/ML  SOLN COMPARISON:  None. FINDINGS: CT CHEST FINDINGS Cardiovascular: Normal heart size. Left anterior descending and left circumflex coronary atherosclerosis. No pericardial effusion. Atherosclerotic nonaneurysmal thoracic aorta. Normal caliber pulmonary arteries. No central pulmonary emboli. Mediastinum/Nodes: No discrete thyroid nodules. Unremarkable esophagus. No pathologically enlarged axillary, mediastinal or hilar lymph nodes. Lungs/Pleura: No pneumothorax. No pleural effusion. No acute consolidative airspace disease or lung masses. A few scattered tiny solid pulmonary nodules in both lungs, largest 3 mm in the posterior right upper lobe (series 5/image 68). Musculoskeletal:  No aggressive appearing focal osseous lesions. CT ABDOMEN PELVIS FINDINGS Hepatobiliary: Normal liver with no liver mass. Cholecystectomy. No biliary ductal dilatation. Pancreas: Normal, with no mass or duct dilation. Spleen: Normal size. No mass. Adrenals/Urinary Tract: Normal adrenals. No hydronephrosis. Subcentimeter  hypodense renal cortical lesions scattered in both kidneys are too small to characterize and require no follow-up. Normal nondistended bladder. Stomach/Bowel: Normal non-distended stomach. Normal caliber small bowel with no small bowel wall thickening. Normal appendix. Irregular annular colonic mass in the cecum/ascending colon centered on the ileocecal valve region measuring approximately 6.1 cm in length (series 7/image 57). Mild sigmoid diverticulosis. No additional sites of large bowel wall thickening. Vascular/Lymphatic:  Atherosclerotic nonaneurysmal abdominal aorta. Patent portal, splenic, hepatic and renal veins. Multiple clustered right ileocolic mesenteric nodes measuring up to 2.1 cm short axis diameter (series 3/image 83). No additional pathologically enlarged lymph nodes in the abdomen or pelvis. Reproductive: Top-normal size prostate with coarse nonspecific internal prostatic calcifications. Other: No pneumoperitoneum, ascites or focal fluid collection. Musculoskeletal: No aggressive appearing focal osseous lesions. Healed deformities in the bilateral pubic rami and right iliac bone and right sacrum. Moderate lumbar spondylosis. IMPRESSION: 1. Irregular annular colonic mass in the cecum/ascending colon centered on the ileocecal valve region, compatible with primary colonic malignancy. No bowel obstruction. 2. Right ileocolic mesenteric lymphadenopathy compatible with metastatic disease. No additional findings highly suspicious for metastatic disease. 3. Few scattered tiny pulmonary nodules in both lungs, largest 3 mm. Recommend attention on follow-up chest CT in 3 months. 4. Coronary atherosclerosis. 5. Aortic Atherosclerosis (ICD10-I70.0). Electronically Signed   By: Ilona Sorrel M.D.   On: 10/07/2019 17:09    Medications: I have reviewed the patient's current medications.  Assessment: Cecum/ascending colon mass most likely malignant, biopsies pending CT showed right ileocolonic mesenteric adenopathy compatible with metastatic disease, no additional findings highly suspicious for metastasis CEA normal at 3.9  Plan: Has been seen by surgical team, plan to keep him on clear liquid diet through the weekend possible surgery early next week. We will follow peripherally, biopsies will likely be available next week.  Ronnette Juniper, MD 10/08/2019, 1:44 PM

## 2019-10-08 NOTE — Progress Notes (Signed)
CT scan and colonoscopy reviewed. Keep patient on clear liquids through weekend. Will discuss plans with patient

## 2019-10-08 NOTE — Progress Notes (Signed)
Family Medicine Teaching Service Daily Progress Note Intern Pager: [phone]  Patient name: [name] Medical record number: [id number] Date of birth: [birth year] Age: 69 y.o. Gender: male  Primary Care Provider: Patient, No Pcp Per Consultants: GI, surgery Code Status: full  Pt Overview and Major Events to Date:  5/5 admitted for GI bleed, hgb 11.4 5/7 - colonoscopy revealing fungating mass  Assessment and Plan: [name] [facility] a [id number] y.o.malepresenting with rectal bleeding, found to have fungating mass in colon concerning for malignancy. PMH is significant forA. Fib,HTN, HLD, CHF,OSA.  GI Bleed 2/2 colonic mass concerning for malignancy [name] is a 69 year old man presenting with GI bleed x1 week. Has not has eliquis (for A fib) since 4/29.asymptomatic, hgb stable above 10.  GI was consulted and performed colonoscopy that revealed a large, fungating, ulcerated, polypoid mass in cecum, most likely a malignancy. Biopsies were taken and have been sent to pathology. Surgery has been consulted, appreciate their recommendations. Plan for resection likely Monday, will continue clear liquid diet. CEA showed nml at 3.9. CT chest/abd/pelv showed mass discussed above as well as right ileocolic mesenteric lymphadenopathy compatible with metastic disease.     CT chest/abdomen/pelvis ordered prior to surgical intervention. CEA pending. - GI consulted, appreciate recs. - Surgery consulted, appreciate recs - will need to consult oncology - Cardiac telemetry - Transfuse if hgb <8 - AM CBC, BMP - Clear liquid diet pending surgery - SCDs for dvt ppx -F/U surgical pathology  Pulmonary nodules Few scattered pulmonary nodule in both lungs, largest 55mm.  Pt is a reported nonsmoker.   - 3 month CT chest follow up.    A Fib Previously converted in 2015 but NSR did not hold, is rate controlled with Lopressor 100 mg twice daily, takes Eliquis 5 mg twice daily. Last dose of  Eliquis was 4/29 due to rectal bleeding.  CHA2DS2-VASc score of 4 (age, CHF, HTN, moderate non-obstructing atherosclerosis in LAD).  - continue lopressor BID - hold eliquis  - cardiac monitoring  Thrombocytopenia- Improved Platelets WNL at 170 upon admission, reduced to 138 on 5/6, today improved to 151. Previously thrombocytopenia noted in January 2020 to 103. Will continue to monitor, recommend outpatient follow up.  -AM CBC  HFrEF, EF 30-35% Non-ischemic cardiomyopathy, thought most likely to be due to tachycardia.Last Echo 10/2018 with LVEF 30-35%.NYHA class I, no SOB when able to afford and take [name] (currently he is able to afford it). Patient had heart cath in 2015 that showed moderate diffuse atherosclerosis in the LAD artery without significant stenosis, otherwise widely patent cardiac arteries.Homemedicationsinclude Entresto 24-26 mg twice daily, furosemide 40 mg twice daily, Lopressor 100 mg twice daily. Patient appears euvolemic.  - Continue home medications: lopressor, entresto - Hold lasix   Groin rash Patient haspruriticscaling plaque along intertriginous zones of inguinal folds and gluteal cleft present for greater than 1 year.Will bleed when he scratches too much.Patient uses shea butter on it without improvement. Most likely tinea cruris, will treat with antifungal cream.  Patient states that he feels great relief with the application of this antifungal cream. - Clotrimazole cream to affected areas   Sinus pressure, L ear fullness Patient reports seasonal allergies. Takes xyzal at home which he states relieves sinus pressure and feeling of fullness he regularly experiences in L ear. the hospital formulary does not carry xyzal, also called levocetirizine. However, we do have cetirizine, and will try this and nasal saline for comfort. 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  No claudication or known PAD, remote h/o smoking, bilateral ABIs WNL in February, 2020. Stable.  HTN Currently normotensive. Home medication includes amlodipine 5 mg daily. - Continue amlodipine  HLD Lipid panel from 2019 with LDL 45, total cholesterol 110.Home medication includes atorvastatin 40 mg daily. - continue atorvastatin  OSA Patient has OSA and has a CPAP at home but has not been using it and does not want to use it.  FEN/GI: NPO PPx: SCDs  Disposition: admitted to med-surg pending surgical intervention for likely malignant colonic mass  Subjective:  Patient stated that he is aware of the results of his colonoscopy and the possibility of malignancy.  We discussed the results of his CT scan including lymphadenopathy and the possibility of spread to other parts of his body.  Patient remains optimistic of his prognosis and states "it is in the Lord's hands."  Patient states that he has felt relief from pruritus with the application of his antifungal cream.  Patient would like to leave the hospital as soon as he is medically able so that he can help take care of his wife.  States his son is currently checking in on her while he is in the hospital.  Objective: Temp:  [97.4 F (36.3 C)-98 F (36.7 C)] 97.8 F (36.6 C) (05/08 0500) Pulse Rate:  [80-95] 83 (05/08 0500) Resp:  [15-30] 18 (05/08 0500) BP: (100-143)/(66-94) 116/66 (05/08 0500) SpO2:  [93 %-98 %] 97 % (05/08 0500) Physical Exam: General: Alert, oriented.  No acute distress. Cardiovascular: Irregularly irregular rhythm.  Normal rate.  No murmurs. Respiratory: Lungs clear to auscultation bilaterally. Abdomen: Soft, nontender.  Normal bowel sounds. Extremities: No swelling.  Laboratory: Recent Labs  Lab 10/05/19 1248 10/06/19 0616 10/07/19 0654  WBC 6.2 6.8 7.9  HGB 11.4* 11.3* 12.5*  HCT 34.3* 33.7* 37.2*  PLT 170 138* 151   Recent Labs  Lab 10/05/19 1248 10/06/19 0616 10/07/19 0654  NA 140  139 136  K 4.1 3.7 3.7  CL 106 107 106  CO2 22 25 22   BUN 12 12 10   CREATININE 0.70 0.73 0.76  CALCIUM 8.9 8.4* 8.5*  PROT 6.4*  --   --   BILITOT 0.6  --   --   ALKPHOS 76  --   --   ALT 21  --   --   AST 23  --   --   GLUCOSE 104* 120* 119*    CEA 4.9 Pathology reports pending.  Imaging/Diagnostic Tests: 1. Irregular annular colonic mass in the cecum/ascending colon centered on the ileocecal valve region, compatible with primary colonic malignancy. No bowel obstruction. 2. Right ileocolic mesenteric lymphadenopathy compatible with metastatic disease. No additional findings highly suspicious for metastatic disease. 3. Few scattered tiny pulmonary nodules in both lungs, largest 3 mm. Recommend attention on follow-up chest CT in 3 months. 4. Coronary atherosclerosis. 5. Aortic Atherosclerosis  Benay Pike, MD 10/08/2019, 7:42 AM PGY-2, [facility] Intern pager: [phone], text pages welcome

## 2019-10-08 NOTE — Progress Notes (Signed)
Patient refused the use of cpap for the evening.  
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
## 2019-10-09 LAB — BASIC METABOLIC PANEL
Anion gap: 8 (ref 5–15)
BUN: 7 mg/dL — ABNORMAL LOW (ref 8–23)
CO2: 25 mmol/L (ref 22–32)
Calcium: 8.6 mg/dL — ABNORMAL LOW (ref 8.9–10.3)
Chloride: 105 mmol/L (ref 98–111)
Creatinine, Ser: 0.76 mg/dL (ref 0.61–1.24)
GFR calc Af Amer: >60 mL/min (ref >60)
GFR calc non Af Amer: >60 mL/min (ref >60)
Glucose, Bld: 108 mg/dL — ABNORMAL HIGH (ref 70–99)
Potassium: 4.1 mmol/L (ref 3.5–5.1)
Sodium: 138 mmol/L (ref 135–145)

## 2019-10-09 LAB — CBC
HCT: 35.1 % — ABNORMAL LOW (ref 39.0–52.0)
Hemoglobin: 11.8 g/dL — ABNORMAL LOW (ref 13.0–17.0)
MCH: 30 pg (ref 26.0–34.0)
MCHC: 33.6 g/dL (ref 30.0–36.0)
MCV: 89.3 fL (ref 80.0–100.0)
Platelets: 148 10*3/uL — ABNORMAL LOW (ref 150–400)
RBC: 3.93 MIL/uL — ABNORMAL LOW (ref 4.22–5.81)
RDW: 14.3 % (ref 11.5–15.5)
WBC: 6.4 10*3/uL (ref 4.0–10.5)
nRBC: 0 % (ref 0.0–0.2)

## 2019-10-09 MED ORDER — CHLORHEXIDINE GLUCONATE 4 % EX LIQD
1.0000 "application " | Freq: Once | CUTANEOUS | Status: DC
  Filled 2019-10-09: qty 15

## 2019-10-09 MED ORDER — TRAZODONE HCL 50 MG PO TABS
50.0000 mg | ORAL_TABLET | Freq: Once | ORAL | Status: AC
  Administered 2019-10-09: 50 mg via ORAL
  Filled 2019-10-09: qty 1

## 2019-10-09 MED ORDER — SODIUM CHLORIDE 0.9 % IV SOLN
2.0000 g | INTRAVENOUS | Status: DC
  Filled 2019-10-09: qty 2

## 2019-10-09 MED ORDER — BUPIVACAINE LIPOSOME 1.3 % IJ SUSP
20.0000 mL | Freq: Once | INTRAMUSCULAR | Status: DC
  Filled 2019-10-09 (×2): qty 20

## 2019-10-09 MED ORDER — METRONIDAZOLE 500 MG PO TABS
1000.0000 mg | ORAL_TABLET | ORAL | Status: AC
  Administered 2019-10-09 (×3): 1000 mg via ORAL
  Filled 2019-10-09 (×3): qty 2

## 2019-10-09 MED ORDER — ALVIMOPAN 12 MG PO CAPS
12.0000 mg | ORAL_CAPSULE | ORAL | Status: AC
  Administered 2019-10-10: 12 mg via ORAL
  Filled 2019-10-09: qty 1

## 2019-10-09 MED ORDER — CARBAMIDE PEROXIDE 6.5 % OT SOLN
5.0000 [drp] | Freq: Two times a day (BID) | OTIC | Status: DC
  Administered 2019-10-09 – 2019-10-12 (×3): 5 [drp] via OTIC
  Filled 2019-10-09: qty 15

## 2019-10-09 MED ORDER — ACETAMINOPHEN 500 MG PO TABS
1000.0000 mg | ORAL_TABLET | ORAL | Status: AC
  Administered 2019-10-10: 1000 mg via ORAL
  Filled 2019-10-09: qty 2

## 2019-10-09 MED ORDER — NEOMYCIN SULFATE 500 MG PO TABS
1000.0000 mg | ORAL_TABLET | ORAL | Status: AC
  Administered 2019-10-09 (×3): 1000 mg via ORAL
  Filled 2019-10-09 (×3): qty 2

## 2019-10-09 NOTE — Progress Notes (Signed)
Progress Note: General Surgery Service   Chief Complaint/Subjective: No bleeding overnight, multiple clear bowel movements  Objective: Vital signs in last 24 hours: Temp:  [97.4 F (36.3 C)-98 F (36.7 C)] 97.6 F (36.4 C) (05/09 0550) Pulse Rate:  [63-89] 89 (05/09 0550) Resp:  [16-20] 20 (05/09 0550) BP: (97-115)/(67-77) 114/77 (05/09 0550) SpO2:  [95 %-97 %] 95 % (05/09 0550) Weight:  [98.3 kg] 98.3 kg (05/09 0700) Last BM Date: 10/05/19  Intake/Output from previous day: No intake/output data recorded. Intake/Output this shift: No intake/output data recorded.  Gen: NAD  Resp: nonlabored  Card: RRR  Abd: soft, NT  Lab Results: CBC  Recent Labs    10/07/19 0654 10/09/19 0559  WBC 7.9 6.4  HGB 12.5* 11.8*  HCT 37.2* 35.1*  PLT 151 148*   BMET Recent Labs    10/07/19 0654 10/09/19 0559  NA 136 138  K 3.7 4.1  CL 106 105  CO2 22 25  GLUCOSE 119* 108*  BUN 10 7*  CREATININE 0.76 0.76  CALCIUM 8.5* 8.6*   PT/INR No results for input(s): LABPROT, INR in the last 72 hours. ABG No results for input(s): PHART, HCO3 in the last 72 hours.  Invalid input(s): PCO2, PO2  Anti-infectives: Anti-infectives (From admission, onward)   None      Medications: Scheduled Meds: . amLODipine  5 mg Oral q AM  . atorvastatin  40 mg Oral QHS  . carbamide peroxide  5 drop Left EAR BID  . cetirizine  10 mg Oral Daily  . clotrimazole   Topical BID  . fluticasone  2 spray Each Nare Daily  . guaiFENesin  600 mg Oral BID  . melatonin  3 mg Oral QHS  . metoprolol tartrate  100 mg Oral BID  . sacubitril-valsartan  1 tablet Oral BID   Continuous Infusions: PRN Meds:.acetaminophen **OR** acetaminophen, ondansetron, sodium chloride  Assessment/Plan: s/p Procedure(s): COLONOSCOPY WITH PROPOFOL BIOPSY POLYPECTOMY SUBMUCOSAL TATTOO INJECTION 10/07/2019  69 yo male with blood per rectum found to have ascending colon mass consistent with cancer. Staging work up shows no  liver or lung masses but shows multiple large lymph nodes around the right colic mesentery suspicious for cancer spreading to lymph nodes. Due to bleeding and work up thing the next best step is a right colectomy. -We discussed details of the surgery and plan: It will be done with general anesthesia with laparoscopic plan to remove the end of small intestine and first 2 feet of colon (right colon) with anastomosis. We discussed normal postoperative course of hospitalization 4-10 days, pain issues, and slowly advancing diet. We discussed possible complications of bleeding, infection, leak, heart exacerbation, pneumonia, hernia, injury to surrounding organs and need for open/larger incision. We discussed likelihood of benefiting from chemotherapy and oncology consult once all pathology is back. We discussed likelihood of recurrence. He showed good understanding, all questions were answered, and he wanted to proceed.   LOS: 3 days   Mickeal Skinner, MD [facility]

## 2019-10-09 NOTE — Progress Notes (Signed)
Family Medicine Teaching Service Daily Progress Note Intern Pager: [phone]  Patient name: [name] Medical record number: [id number] Date of birth: 11/20/1950 Age: 69 y.o. Gender: male  Primary Care Provider: Patient, No Pcp Per Consultants: GI, surgery Code Status: full  Pt Overview and Major Events to Date:  5/5 admitted for GI bleed, hgb 11.4 5/7 - colonoscopy revealing fungating mass  Assessment and Plan: [name] a [id number] y.o.malepresenting with rectal bleeding, found to have fungating mass in colon concerning for malignancy. PMH is significant forA. Fib,HTN, HLD, CHF,OSA.  GI Bleed 2/2 colonic mass concerning for malignancy [name] is a 69 year old man presenting with GI bleed x1 week. Has not has eliquis (for A fib) since 4/29. asymptomatic, hgb stable above 10.  GI was consulted and performed colonoscopy that revealed a large, fungating, ulcerated, polypoid mass in cecum, most likely a malignancy. Biopsies were taken and have been sent to pathology. Surgery has been consulted, appreciate their recommendations. Plan for resection likely Monday, will continue clear liquid diet. CEA WNL at 3.9. CT chest/abd/pelv showed mass discussed above as well as right ileocolic mesenteric lymphadenopathy compatible with metastic disease.    - GI consulted, appreciate recs. - Surgery consulted, appreciate recs - will need to consult oncology - Cardiac telemetry - Transfuse if hgb <8 - AM CBC, BMP - Clear liquid diet pending surgery - SCDs for dvt ppx - F/U surgical pathology  Pulmonary nodules Few scattered pulmonary nodule in both lungs, largest 44mm.  Pt is a reported nonsmoker.   - 3 month CT chest follow up.    A Fib HR today ranges from 65-89. Previously converted in 2015 but NSR did not hold, is rate controlled with Lopressor 100 mg twice daily, takes Eliquis 5 mg twice daily. Last dose of Eliquis was 4/29 due to rectal bleeding.  CHA2DS2-VASc score  of 4 (age, CHF, HTN, moderate non-obstructing atherosclerosis in LAD).  - continue lopressor BID - hold eliquis  - cardiac monitoring  Thrombocytopenia- Improved Platelets WNL at 170 upon admission, reduced to 138 on 5/6, stable today at 148. Previously thrombocytopenia noted in January 2020 to 103. Will continue to monitor, recommend outpatient follow up.  -AM CBC  HFrEF, EF 30-35% Non-ischemic cardiomyopathy, thought most likely to be due to tachycardia.Last Echo 10/2018 with LVEF 30-35%.NYHA class I, no SOB when able to afford and take [name] (currently he is able to afford it). Patient had heart cath in 2015 that showed moderate diffuse atherosclerosis in the LAD artery without significant stenosis, otherwise widely patent cardiac arteries.Homemedicationsinclude Entresto 24-26 mg twice daily, furosemide 40 mg twice daily, Lopressor 100 mg twice daily. Patient appears euvolemic. Cardiology consulted for optimization prior to surgery: acceptable risk for planned procedure without additional cardiac work up. Recommend strict I/Os, daily weights, avoid significant volume overload. If he develops an ileus after surgery, can use IV metoprolol or IV cardizem to help with ventricular rate control. - Continue home medications: lopressor, entresto - Hold lasix  - Strict I/Os - Daily Weights - Avoid volume overload  Groin rash Patient haspruriticscaling plaque along intertriginous zones of inguinal folds and gluteal cleft present for greater than 1 year. Most likely tinea cruris, will treat with antifungal cream.  Patient states that he feels great relief with the application of this antifungal cream. - Clotrimazole cream to affected areas   Sinus pressure, L ear fullness Patient reports seasonal allergies. Takes xyzal at home which he states relieves sinus pressure and feeling of fullness he regularly  experiences in L ear. the hospital formulary does not carry xyzal, also called  levocetirizine. However, we do have cetirizine, and will try this and flonase. Also debrox for ear wax. 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 No claudication or known PAD, remote h/o smoking, bilateral ABIs WNL in February, 2020. Stable. Onychomycosis present on toenails.  HTN Currently normotensive. Home medication includes amlodipine 5 mg daily. - Continue amlodipine  HLD Lipid panel from 2019 with LDL 45, total cholesterol 110.Home medication includes atorvastatin 40 mg daily. - continue atorvastatin  OSA Patient has OSA and has a CPAP at home but has not been using it and does not want to use it in hospital.  FEN/GI: NPO PPx: SCDs  Disposition: admitted to med-surg pending surgical intervention for likely malignant colonic mass  Subjective:  Patient feeling hopeful today. No pain, no rectal bleeding.   Objective: Temp:  [97.4 F (36.3 C)-98 F (36.7 C)] 97.6 F (36.4 C) (05/09 0550) Pulse Rate:  [63-89] 89 (05/09 0550) Resp:  [16-20] 20 (05/09 0550) BP: (97-115)/(67-77) 114/77 (05/09 0550) SpO2:  [95 %-97 %] 95 % (05/09 0550) Weight:  [98.3 kg] 98.3 kg (05/09 0700) Physical Exam: General: Alert, oriented.  No acute distress. Cardiovascular: Irregularly irregular rhythm.  Normal rate.  No murmurs. Respiratory: Lungs clear to auscultation bilaterally. Abdomen: Soft, nontender.  Normal bowel sounds. Extremities: No swelling.  Laboratory: Recent Labs  Lab 10/06/19 0616 10/07/19 0654 10/09/19 0559  WBC 6.8 7.9 6.4  HGB 11.3* 12.5* 11.8*  HCT 33.7* 37.2* 35.1*  PLT 138* 151 148*   Recent Labs  Lab 10/05/19 1248 10/05/19 1248 10/06/19 0616 10/07/19 0654 10/09/19 0559  NA 140   < > 139 136 138  K 4.1   < > 3.7 3.7 4.1  CL 106   < > 107 106 105  CO2 22   < > 25 22 25   BUN 12   < > 12 10 7*  CREATININE 0.70   < > 0.73 0.76 0.76  CALCIUM 8.9   < > 8.4* 8.5* 8.6*   PROT 6.4*  --   --   --   --   BILITOT 0.6  --   --   --   --   ALKPHOS 76  --   --   --   --   ALT 21  --   --   --   --   AST 23  --   --   --   --   GLUCOSE 104*   < > 120* 119* 108*   < > = values in this interval not displayed.    CEA 4.9 Pathology reports pending.  Imaging/Diagnostic Tests: 1. Irregular annular colonic mass in the cecum/ascending colon centered on the ileocecal valve region, compatible with primary colonic malignancy. No bowel obstruction. 2. Right ileocolic mesenteric lymphadenopathy compatible with metastatic disease. No additional findings highly suspicious for metastatic disease. 3. Few scattered tiny pulmonary nodules in both lungs, largest 3 mm. Recommend attention on follow-up chest CT in 3 months. 4. Coronary atherosclerosis. 5. Aortic Atherosclerosis  Gladys Damme, MD 10/09/2019, 7:50 AM PGY-1, [place] Intern pager: [phone], text pages welcome

## 2019-10-10 ENCOUNTER — Encounter (HOSPITAL_COMMUNITY): Payer: Self-pay | Admitting: Family Medicine

## 2019-10-10 ENCOUNTER — Other Ambulatory Visit: Payer: Self-pay

## 2019-10-10 ENCOUNTER — Inpatient Hospital Stay (HOSPITAL_COMMUNITY): Payer: Medicare Other | Admitting: Certified Registered Nurse Anesthetist

## 2019-10-10 ENCOUNTER — Encounter (HOSPITAL_COMMUNITY)
Admission: EM | Disposition: A | Discharge status: Home / Self Care | Payer: Self-pay | Source: Home / Self Care | Attending: Family Medicine

## 2019-10-10 DIAGNOSIS — I482 Chronic atrial fibrillation, unspecified: Secondary | ICD-10-CM

## 2019-10-10 HISTORY — PX: LAPAROSCOPIC PARTIAL COLECTOMY: SHX5907

## 2019-10-10 LAB — CBC
HCT: 33.6 % — ABNORMAL LOW (ref 39.0–52.0)
Hemoglobin: 11.4 g/dL — ABNORMAL LOW (ref 13.0–17.0)
MCH: 30.2 pg (ref 26.0–34.0)
MCHC: 33.9 g/dL (ref 30.0–36.0)
MCV: 89.1 fL (ref 80.0–100.0)
Platelets: 154 10*3/uL (ref 150–400)
RBC: 3.77 MIL/uL — ABNORMAL LOW (ref 4.22–5.81)
RDW: 14.1 % (ref 11.5–15.5)
WBC: 6 10*3/uL (ref 4.0–10.5)
nRBC: 0 % (ref 0.0–0.2)

## 2019-10-10 LAB — TYPE AND SCREEN
ABO/RH(D): B POS
Antibody Screen: NEGATIVE

## 2019-10-10 LAB — BASIC METABOLIC PANEL
Anion gap: 8 (ref 5–15)
BUN: 7 mg/dL — ABNORMAL LOW (ref 8–23)
CO2: 24 mmol/L (ref 22–32)
Calcium: 8.5 mg/dL — ABNORMAL LOW (ref 8.9–10.3)
Chloride: 107 mmol/L (ref 98–111)
Creatinine, Ser: 0.82 mg/dL (ref 0.61–1.24)
GFR calc Af Amer: >60 mL/min (ref >60)
GFR calc non Af Amer: >60 mL/min (ref >60)
Glucose, Bld: 108 mg/dL — ABNORMAL HIGH (ref 70–99)
Potassium: 4 mmol/L (ref 3.5–5.1)
Sodium: 139 mmol/L (ref 135–145)

## 2019-10-10 SURGERY — LAPAROSCOPIC PARTIAL COLECTOMY
Anesthesia: General | Site: Abdomen

## 2019-10-10 MED ORDER — PHENYLEPHRINE HCL-NACL 10-0.9 MG/250ML-% IV SOLN
INTRAVENOUS | Status: DC | PRN
  Administered 2019-10-10: 65 ug/min via INTRAVENOUS

## 2019-10-10 MED ORDER — SODIUM CHLORIDE 0.9 % IV SOLN
INTRAVENOUS | Status: AC
  Filled 2019-10-10: qty 2

## 2019-10-10 MED ORDER — LACTATED RINGERS IV SOLN: INTRAVENOUS | Status: DC

## 2019-10-10 MED ORDER — BUPIVACAINE LIPOSOME 1.3 % IJ SUSP
INTRAMUSCULAR | Status: DC | PRN
  Administered 2019-10-10: 20 mL

## 2019-10-10 MED ORDER — ALUM & MAG HYDROXIDE-SIMETH 200-200-20 MG/5ML PO SUSP: 30.0000 mL | Freq: Four times a day (QID) | ORAL | Status: DC | PRN

## 2019-10-10 MED ORDER — VASOPRESSIN 20 UNIT/ML IV SOLN
INTRAVENOUS | Status: DC | PRN
  Administered 2019-10-10: 2 [IU] via INTRAVENOUS

## 2019-10-10 MED ORDER — ENSURE SURGERY PO LIQD
237.0000 mL | Freq: Two times a day (BID) | ORAL | Status: DC
  Administered 2019-10-11 (×2): 237 mL via ORAL
  Filled 2019-10-10 (×7): qty 237

## 2019-10-10 MED ORDER — FENTANYL CITRATE (PF) 250 MCG/5ML IJ SOLN
INTRAMUSCULAR | Status: AC
  Filled 2019-10-10: qty 5

## 2019-10-10 MED ORDER — SACCHAROMYCES BOULARDII 250 MG PO CAPS
250.0000 mg | ORAL_CAPSULE | Freq: Two times a day (BID) | ORAL | Status: DC
  Administered 2019-10-10 – 2019-10-13 (×7): 250 mg via ORAL
  Filled 2019-10-10 (×7): qty 1

## 2019-10-10 MED ORDER — BUPIVACAINE HCL (PF) 0.25 % IJ SOLN
INTRAMUSCULAR | Status: AC
  Filled 2019-10-10: qty 30

## 2019-10-10 MED ORDER — STERILE WATER FOR IRRIGATION IR SOLN
Status: DC | PRN
  Administered 2019-10-10: 1000 mL

## 2019-10-10 MED ORDER — FENTANYL CITRATE (PF) 100 MCG/2ML IJ SOLN: 25.0000 ug | INTRAMUSCULAR | Status: DC | PRN

## 2019-10-10 MED ORDER — DEXAMETHASONE SODIUM PHOSPHATE 10 MG/ML IJ SOLN
INTRAMUSCULAR | Status: DC | PRN
  Administered 2019-10-10: 4 mg via INTRAVENOUS

## 2019-10-10 MED ORDER — SODIUM CHLORIDE 0.9 % IR SOLN
Status: DC | PRN
  Administered 2019-10-10: 1000 mL

## 2019-10-10 MED ORDER — MIDAZOLAM HCL 2 MG/2ML IJ SOLN
INTRAMUSCULAR | Status: AC
  Filled 2019-10-10: qty 2

## 2019-10-10 MED ORDER — MORPHINE SULFATE (PF) 2 MG/ML IV SOLN: 2.0000 mg | INTRAVENOUS | Status: DC | PRN

## 2019-10-10 MED ORDER — SODIUM CHLORIDE 0.9 % IV SOLN: INTRAVENOUS | Status: DC

## 2019-10-10 MED ORDER — OXYCODONE HCL 5 MG PO TABS: 5.0000 mg | ORAL_TABLET | ORAL | Status: DC | PRN

## 2019-10-10 MED ORDER — GABAPENTIN 300 MG PO CAPS
300.0000 mg | ORAL_CAPSULE | Freq: Two times a day (BID) | ORAL | Status: DC
  Administered 2019-10-10 – 2019-10-13 (×7): 300 mg via ORAL
  Filled 2019-10-10 (×7): qty 1

## 2019-10-10 MED ORDER — LIDOCAINE IN D5W 4-5 MG/ML-% IV SOLN
1.0000 mg/min | INTRAVENOUS | Status: DC
  Administered 2019-10-10: 25 ug/kg/min via INTRAVENOUS
  Filled 2019-10-10: qty 500

## 2019-10-10 MED ORDER — PHENYLEPHRINE 40 MCG/ML (10ML) SYRINGE FOR IV PUSH (FOR BLOOD PRESSURE SUPPORT)
PREFILLED_SYRINGE | INTRAVENOUS | Status: DC | PRN
  Administered 2019-10-10: 200 ug via INTRAVENOUS

## 2019-10-10 MED ORDER — METOPROLOL TARTRATE 5 MG/5ML IV SOLN: 5.0000 mg | Freq: Four times a day (QID) | INTRAVENOUS | Status: DC | PRN

## 2019-10-10 MED ORDER — DIPHENHYDRAMINE HCL 50 MG/ML IJ SOLN: 25.0000 mg | Freq: Four times a day (QID) | INTRAMUSCULAR | Status: DC | PRN

## 2019-10-10 MED ORDER — PROPOFOL 10 MG/ML IV BOLUS
INTRAVENOUS | Status: DC | PRN
  Administered 2019-10-10: 150 mg via INTRAVENOUS

## 2019-10-10 MED ORDER — GABAPENTIN 300 MG PO CAPS
300.0000 mg | ORAL_CAPSULE | ORAL | Status: AC
  Administered 2019-10-10: 300 mg via ORAL
  Filled 2019-10-10: qty 1

## 2019-10-10 MED ORDER — TRAZODONE HCL 50 MG PO TABS
50.0000 mg | ORAL_TABLET | Freq: Once | ORAL | Status: AC
  Administered 2019-10-10: 50 mg via ORAL
  Filled 2019-10-10: qty 1

## 2019-10-10 MED ORDER — ROCURONIUM BROMIDE 10 MG/ML (PF) SYRINGE
PREFILLED_SYRINGE | INTRAVENOUS | Status: DC | PRN
  Administered 2019-10-10: 30 mg via INTRAVENOUS
  Administered 2019-10-10: 100 mg via INTRAVENOUS

## 2019-10-10 MED ORDER — FENTANYL CITRATE (PF) 100 MCG/2ML IJ SOLN
INTRAMUSCULAR | Status: DC | PRN
  Administered 2019-10-10: 150 ug via INTRAVENOUS
  Administered 2019-10-10: 100 ug via INTRAVENOUS
  Administered 2019-10-10: 50 ug via INTRAVENOUS
  Administered 2019-10-10: 100 ug via INTRAVENOUS

## 2019-10-10 MED ORDER — ENOXAPARIN SODIUM 40 MG/0.4ML ~~LOC~~ SOLN
40.0000 mg | SUBCUTANEOUS | Status: DC
  Administered 2019-10-11 – 2019-10-12 (×2): 40 mg via SUBCUTANEOUS
  Filled 2019-10-10 (×2): qty 0.4

## 2019-10-10 MED ORDER — ALBUMIN HUMAN 5 % IV SOLN: INTRAVENOUS | Status: DC | PRN

## 2019-10-10 MED ORDER — MIDAZOLAM HCL 2 MG/2ML IJ SOLN
INTRAMUSCULAR | Status: DC | PRN
  Administered 2019-10-10: 2 mg via INTRAVENOUS

## 2019-10-10 MED ORDER — LACTATED RINGERS IV SOLN
INTRAVENOUS | Status: DC | PRN
Start: 2019-10-10 — End: 2019-10-10

## 2019-10-10 MED ORDER — 0.9 % SODIUM CHLORIDE (POUR BTL) OPTIME
TOPICAL | Status: DC | PRN
  Administered 2019-10-10 (×2): 1000 mL

## 2019-10-10 MED ORDER — DIPHENHYDRAMINE HCL 25 MG PO CAPS: 25.0000 mg | ORAL_CAPSULE | Freq: Four times a day (QID) | ORAL | Status: DC | PRN

## 2019-10-10 MED ORDER — PROMETHAZINE HCL 25 MG/ML IJ SOLN
6.2500 mg | Freq: Once | INTRAMUSCULAR | Status: AC
  Administered 2019-10-10: 6.25 mg via INTRAVENOUS

## 2019-10-10 MED ORDER — TRAMADOL HCL 50 MG PO TABS: 50.0000 mg | ORAL_TABLET | Freq: Four times a day (QID) | ORAL | Status: DC | PRN

## 2019-10-10 MED ORDER — ONDANSETRON HCL 4 MG/2ML IJ SOLN
4.0000 mg | Freq: Four times a day (QID) | INTRAMUSCULAR | Status: DC | PRN
  Administered 2019-10-11: 4 mg via INTRAVENOUS
  Filled 2019-10-10: qty 2

## 2019-10-10 MED ORDER — BUPIVACAINE HCL 0.25 % IJ SOLN
INTRAMUSCULAR | Status: DC | PRN
  Administered 2019-10-10: 20 mL

## 2019-10-10 MED ORDER — ACETAMINOPHEN 325 MG PO TABS: 650.0000 mg | ORAL_TABLET | Freq: Four times a day (QID) | ORAL | Status: DC | PRN

## 2019-10-10 MED ORDER — ONDANSETRON HCL 4 MG PO TABS: 4.0000 mg | ORAL_TABLET | Freq: Four times a day (QID) | ORAL | Status: DC | PRN

## 2019-10-10 MED ORDER — LIDOCAINE 2% (20 MG/ML) 5 ML SYRINGE
INTRAMUSCULAR | Status: DC | PRN
  Administered 2019-10-10: 60 mg via INTRAVENOUS

## 2019-10-10 MED ORDER — ACETAMINOPHEN 500 MG PO TABS
1000.0000 mg | ORAL_TABLET | Freq: Four times a day (QID) | ORAL | Status: DC
  Administered 2019-10-10 – 2019-10-13 (×9): 1000 mg via ORAL
  Filled 2019-10-10 (×11): qty 2

## 2019-10-10 MED ORDER — SODIUM CHLORIDE 0.9 % IV SOLN
2.0000 g | Freq: Once | INTRAVENOUS | Status: AC
  Administered 2019-10-10: 2 g via INTRAVENOUS

## 2019-10-10 MED ORDER — ONDANSETRON HCL 4 MG/2ML IJ SOLN
INTRAMUSCULAR | Status: DC | PRN
  Administered 2019-10-10: 4 mg via INTRAVENOUS

## 2019-10-10 MED ORDER — PROPOFOL 10 MG/ML IV BOLUS
INTRAVENOUS | Status: AC
  Filled 2019-10-10: qty 40

## 2019-10-10 MED ORDER — PROMETHAZINE HCL 25 MG/ML IJ SOLN
INTRAMUSCULAR | Status: AC
  Filled 2019-10-10: qty 1

## 2019-10-10 MED ORDER — VASOPRESSIN 20 UNIT/ML IV SOLN
INTRAVENOUS | Status: AC
  Filled 2019-10-10: qty 1

## 2019-10-10 SURGICAL SUPPLY — 76 items
APPLIER CLIP ROT 10 11.4 M/L (STAPLE) ×3
BLADE CLIPPER SURG (BLADE) ×2 IMPLANT
CANISTER SUCT 3000ML PPV (MISCELLANEOUS) ×3 IMPLANT
CELLS DAT CNTRL 66122 CELL SVR (MISCELLANEOUS) IMPLANT
CHLORAPREP W/TINT 26 (MISCELLANEOUS) ×3 IMPLANT
CLIP APPLIE ROT 10 11.4 M/L (STAPLE) IMPLANT
COVER MAYO STAND STRL (DRAPES) ×3 IMPLANT
COVER SURGICAL LIGHT HANDLE (MISCELLANEOUS) ×6 IMPLANT
COVER WAND RF STERILE (DRAPES) ×3 IMPLANT
DEFOGGER SCOPE WARMER CLEARIFY (MISCELLANEOUS) ×1 IMPLANT
DRAPE HALF SHEET 40X57 (DRAPES) ×2 IMPLANT
DRAPE UTILITY XL STRL (DRAPES) ×3 IMPLANT
DRAPE WARM FLUID 44X44 (DRAPES) ×3 IMPLANT
DRSG COVADERM PLUS 2X2 (GAUZE/BANDAGES/DRESSINGS) ×4 IMPLANT
DRSG OPSITE POSTOP 4X10 (GAUZE/BANDAGES/DRESSINGS) IMPLANT
DRSG OPSITE POSTOP 4X8 (GAUZE/BANDAGES/DRESSINGS) ×2 IMPLANT
ELECT BLADE 6.5 EXT (BLADE) ×3 IMPLANT
ELECT CAUTERY BLADE 6.4 (BLADE) ×3 IMPLANT
ELECT REM PT RETURN 9FT ADLT (ELECTROSURGICAL) ×3
ELECTRODE REM PT RTRN 9FT ADLT (ELECTROSURGICAL) ×1 IMPLANT
GAUZE 4X4 16PLY RFD (DISPOSABLE) ×2 IMPLANT
GEL ULTRASOUND 20GR AQUASONIC (MISCELLANEOUS) IMPLANT
GLOVE BIO SURGEON STRL SZ7.5 (GLOVE) ×6 IMPLANT
GLOVE BIOGEL PI IND STRL 7.0 (GLOVE) IMPLANT
GLOVE BIOGEL PI IND STRL 8 (GLOVE) ×2 IMPLANT
GLOVE BIOGEL PI INDICATOR 7.0 (GLOVE) ×6
GLOVE BIOGEL PI INDICATOR 8 (GLOVE) ×4
GLOVE ECLIPSE 7.0 STRL STRAW (GLOVE) ×6 IMPLANT
GLOVE SURG SS PI 6.5 STRL IVOR (GLOVE) ×8 IMPLANT
GOWN STRL REUS W/ TWL LRG LVL3 (GOWN DISPOSABLE) ×6 IMPLANT
GOWN STRL REUS W/ TWL XL LVL3 (GOWN DISPOSABLE) ×2 IMPLANT
GOWN STRL REUS W/TWL LRG LVL3 (GOWN DISPOSABLE) ×18
GOWN STRL REUS W/TWL XL LVL3 (GOWN DISPOSABLE) ×6
KIT BASIN OR (CUSTOM PROCEDURE TRAY) ×3 IMPLANT
KIT TURNOVER KIT B (KITS) ×3 IMPLANT
NDL INSUFFLATION 14GA 120MM (NEEDLE) IMPLANT
NEEDLE INSUFFLATION 14GA 120MM (NEEDLE) ×3 IMPLANT
NS IRRIG 1000ML POUR BTL (IV SOLUTION) ×6 IMPLANT
PAD ARMBOARD 7.5X6 YLW CONV (MISCELLANEOUS) ×6 IMPLANT
PENCIL SMOKE EVACUATOR (MISCELLANEOUS) ×6 IMPLANT
POUCH LAPAROSCOPIC INSTRUMENT (MISCELLANEOUS) ×3 IMPLANT
RELOAD PROXIMATE 75MM BLUE (ENDOMECHANICALS) ×6 IMPLANT
RELOAD STAPLE 75 3.8 BLU REG (ENDOMECHANICALS) IMPLANT
RETRACTOR WND ALEXIS 18 MED (MISCELLANEOUS) IMPLANT
RTRCTR WOUND ALEXIS 18CM MED (MISCELLANEOUS)
SCISSORS LAP 5X35 DISP (ENDOMECHANICALS) ×3 IMPLANT
SET IRRIG TUBING LAPAROSCOPIC (IRRIGATION / IRRIGATOR) ×2 IMPLANT
SET TUBE SMOKE EVAC HIGH FLOW (TUBING) ×3 IMPLANT
SHEARS HARMONIC ACE PLUS 36CM (ENDOMECHANICALS) ×3 IMPLANT
SLEEVE ENDOPATH XCEL 5M (ENDOMECHANICALS) ×3 IMPLANT
SPECIMEN JAR LARGE (MISCELLANEOUS) ×3 IMPLANT
SPONGE LAP 18X18 RF (DISPOSABLE) ×2 IMPLANT
STAPLER GUN LINEAR PROX 60 (STAPLE) ×2 IMPLANT
STAPLER PROXIMATE 75MM BLUE (STAPLE) ×2 IMPLANT
STAPLER VISISTAT 35W (STAPLE) ×3 IMPLANT
SUT PDS AB 1 CT  36 (SUTURE) ×4
SUT PDS AB 1 CT 36 (SUTURE) ×2 IMPLANT
SUT SILK 2 0 (SUTURE) ×2
SUT SILK 2 0 SH CR/8 (SUTURE) ×3 IMPLANT
SUT SILK 2-0 18XBRD TIE 12 (SUTURE) ×1 IMPLANT
SUT SILK 3 0 (SUTURE) ×2
SUT SILK 3 0 SH CR/8 (SUTURE) ×3 IMPLANT
SUT SILK 3-0 18XBRD TIE 12 (SUTURE) ×1 IMPLANT
SYR BULB IRRIG 60ML STRL (SYRINGE) ×3 IMPLANT
SYS LAPSCP GELPORT 120MM (MISCELLANEOUS)
SYSTEM LAPSCP GELPORT 120MM (MISCELLANEOUS) IMPLANT
TOWEL GREEN STERILE (TOWEL DISPOSABLE) ×6 IMPLANT
TRAY COLON PACK (CUSTOM PROCEDURE TRAY) ×2 IMPLANT
TRAY FOLEY MTR SLVR 16FR STAT (SET/KITS/TRAYS/PACK) ×3 IMPLANT
TRAY LAPAROSCOPIC MC (CUSTOM PROCEDURE TRAY) ×3 IMPLANT
TROCAR XCEL 12X100 BLDLESS (ENDOMECHANICALS) ×2 IMPLANT
TROCAR XCEL NON-BLD 5MMX100MML (ENDOMECHANICALS) ×3 IMPLANT
TUBE CONNECTING 12'X1/4 (SUCTIONS) ×3
TUBE CONNECTING 12X1/4 (SUCTIONS) ×5 IMPLANT
WATER STERILE IRR 1000ML POUR (IV SOLUTION) ×3 IMPLANT
YANKAUER SUCT BULB TIP NO VENT (SUCTIONS) ×8 IMPLANT

## 2019-10-10 NOTE — Anesthesia Preprocedure Evaluation (Addendum)
Anesthesia Evaluation  Patient identified by MRN, date of birth, ID band Patient awake    Reviewed: Allergy & Precautions, NPO status , [name] Chart, lab work & pertinent test results, reviewed documented beta blocker date and time   Airway Mallampati: III  TM Distance: >3 FB Neck ROM: Full    Dental  (+) Edentulous Upper,    Pulmonary sleep apnea and Continuous Positive Airway Pressure Ventilation , former smoker,    Pulmonary exam normal breath sounds clear to auscultation       Cardiovascular hypertension, Pt. on medications and Pt. on home beta blockers +CHF  Normal cardiovascular exam+ dysrhythmias Atrial Fibrillation + Valvular Problems/Murmurs MR  Rhythm:Regular Rate:Normal  TTE 2020 1. The left ventricle has moderate-severely reduced systolic function, with an ejection fraction of 30-35%. The cavity size was severely dilated. Left ventricular diastolic function could not be evaluated secondary to atrial fibrillation. Left ventricular diffuse hypokinesis.  2. The right ventricle has normal systolic function. The cavity was normal. There is no increase in right ventricular wall thickness.  3. Left atrial size was severely dilated.  4. There is moderate mitral annular calcification present. Mitral valve regurgitation is moderate by color flow Doppler. The MR jet is posteriorly-directed.  5. The aortic valve is tricuspid. Mild sclerosis of the aortic valve.  6. The aorta is normal unless otherwise noted.    Neuro/Psych negative neurological ROS  negative psych ROS   GI/Hepatic negative GI ROS, Neg liver ROS,   Endo/Other  Hypothyroidism   Renal/GU negative Renal ROS  negative genitourinary   Musculoskeletal  (+) Arthritis ,   Abdominal   Peds  Hematology  (+) Blood dyscrasia (on eliquis), ,   Anesthesia Other Findings   Reproductive/Obstetrics                            Anesthesia  Physical Anesthesia Plan  ASA: III  Anesthesia Plan: General   Post-op Pain Management:    Induction: Intravenous  PONV Risk Score and Plan: 2 and Midazolam, Dexamethasone and Ondansetron  Airway Management Planned: Oral ETT  Additional Equipment:   Intra-op Plan:   Post-operative Plan: Extubation in OR  Informed Consent: I have reviewed the patients History and Physical, chart, labs and discussed the procedure including the risks, benefits and alternatives for the proposed anesthesia with the patient or authorized representative who has indicated his/her understanding and acceptance.     Dental advisory given  Plan Discussed with: CRNA  Anesthesia Plan Comments:         Anesthesia Quick Evaluation

## 2019-10-10 NOTE — Anesthesia Procedure Notes (Signed)
Procedure Name: Intubation Date/Time: 10/10/2019 10:03 AM Performed by: Leonor Liv, CRNA Pre-anesthesia Checklist: Patient identified, Emergency Drugs available, Suction available and Patient being monitored Patient Re-evaluated:Patient Re-evaluated prior to induction Oxygen Delivery Method: Circle System Utilized Preoxygenation: Pre-oxygenation with 100% oxygen Induction Type: IV induction Ventilation: Mask ventilation without difficulty Laryngoscope Size: Mac and 4 Grade View: Grade I Tube type: Oral Tube size: 8.0 mm Number of attempts: 1 Airway Equipment and Method: Stylet and Oral airway Placement Confirmation: ETT inserted through vocal cords under direct vision,  positive ETCO2 and breath sounds checked- equal and bilateral Secured at: 24 cm Tube secured with: Tape Dental Injury: Teeth and Oropharynx as per pre-operative assessment

## 2019-10-10 NOTE — Transfer of Care (Signed)
Immediate Anesthesia Transfer of Care Note  Patient: [name]  Procedure(s) Performed: LAPAROSCOPIC PARTIAL COLECTOMY WITH ILEOCOLIC ANASTOMOSIS (N/A Abdomen)  Patient Location: PACU  Anesthesia Type:General  Level of Consciousness: drowsy  Airway & Oxygen Therapy: Patient Spontanous Breathing and Patient connected to nasal cannula oxygen  Post-op Assessment: Report given to RN and Post -op Vital signs reviewed and stable  Post vital signs: Reviewed and stable  Last Vitals:  Vitals Value Taken Time  BP 151/84 10/10/19 1238  Temp    Pulse 80 10/10/19 1241  Resp 20 10/10/19 1241  SpO2 98 % 10/10/19 1241  Vitals shown include unvalidated device data.  Last Pain:  Vitals:   10/10/19 0945  TempSrc:   PainSc: 0-No pain         Complications: No apparent anesthesia complications oral airway in place

## 2019-10-10 NOTE — Progress Notes (Signed)
Family Medicine Teaching Service Daily Progress Note Intern Pager: [phone]  Patient name: TRAVONNE SCHOWALTER Medical record number: [id number] Date of birth: Jan 30, 1951 Age: 69 y.o. Gender: male  Primary Care Provider: Patient, No Pcp Per Consultants: GI, surgery Code Status: full  Pt Overview and Major Events to Date:  5/5 admitted for GI bleed, hgb 11.4 5/7 - colonoscopy revealing fungating mass  Assessment and Plan: [place] [facility] a 69 y.o.malepresenting with rectal bleeding, found to have fungating mass in colon concerning for malignancy. PMH is significant forA. Fib,HTN, HLD, CHF,OSA.  GI Bleed 2/2 colonic mass concerning for malignancy Dam Ashraf is a 69 year old man presenting with GI bleed x1 week. Has not has eliquis (for A fib) since 4/29. Asymptomatic, hgb remains stable above 10.  GI was consulted and performed colonoscopy that revealed a large, fungating, ulcerated, polypoid mass in cecum, most likely a malignancy. Biopsies were taken and have been sent to pathology, results showed a well defined endocrine tumor, ki-67 pending. A polyp was also removed during colonoscopy which proved to be a tubular adenoma with high grade dysplasia; however, the stalk had clean margins.  CEA WNL at 3.9. CT chest/abd/pelv showed mass discussed above as well as right ileocolic mesenteric lymphadenopathy compatible with metastic disease. Surgery has been consulted, appreciate their recommendations. Patient underwent successful resection this morning, resulting in an anastomosis. He is doing well post-operatively. - GI consulted, appreciate recs. - Surgery consulted, appreciate recs - will need to consult oncology - Cardiac telemetry - Transfuse if hgb <8 - AM CBC, BMP - Clear liquid diet per surgery - SCDs for dvt ppx  Pulmonary nodules Few scattered pulmonary nodule in both lungs, largest 50m.  Pt is a reported nonsmoker.   - 3 month CT chest follow up.    A Fib HR  today ranges from 76-86. Previously converted in 2015 but NSR did not hold, is rate controlled with Lopressor 100 mg twice daily, takes Eliquis 5 mg twice daily. Last dose of Eliquis was 4/29 due to rectal bleeding.  CHA2DS2-VASc score of 4 (age, CHF, HTN, moderate non-obstructing atherosclerosis in LAD). For more cardiac information, see HFrEF section below.  - continue lopressor BID - hold eliquis  - cardiac monitoring  HFrEF, EF 30-35% Weight increased from today to 98.3kg from 96.2kg yesterday. UOP appropriate at 1.4L, net 450 mL yesterday. Non-ischemic cardiomyopathy, thought most likely to be due to tachycardia.Last Echo 10/2018 with LVEF 30-35%.NYHA class I, no SOB when able to afford and take EDelene Loll(currently he is able to afford it). Patient had heart cath in 2015 that showed moderate diffuse atherosclerosis in the LAD artery without significant stenosis, otherwise widely patent cardiac arteries.Homemedicationsinclude Entresto 24-26 mg twice daily, furosemide 40 mg twice daily, Lopressor 100 mg twice daily. Patient appears euvolemic. Cardiology consulted for optimization prior to surgery: acceptable risk for planned procedure without additional cardiac work up. Recommend strict I/Os, daily weights, avoid significant volume overload. If he develops an ileus after surgery, can use IV metoprolol or IV cardizem to help with ventricular rate control. - Continue home medications: lopressor, entresto - Hold lasix  - Strict I/Os - Daily Weights - Avoid volume overload  Thrombocytopenia- Improved Platelets WNL at 170 upon admission, reduced to 138 on 5/6, stable today at 154. Previously thrombocytopenia noted in January 2020 to 103. Will continue to monitor, recommend outpatient follow up.  -AM CBC  Groin rash Patient haspruriticscaling plaque along intertriginous zones of inguinal folds and gluteal cleft present for greater than 1  year. Most likely tinea cruris, will treat with  antifungal cream.  Patient states that he feels great relief with the application of this antifungal cream. - Clotrimazole cream to affected areas   Sinus pressure, L ear fullness Patient reports seasonal allergies. Takes xyzal at home which he states relieves sinus pressure and feeling of fullness he regularly experiences in L ear. the hospital formulary does not carry xyzal, also called levocetirizine. However, we do have cetirizine, and will try this and flonase. Also debrox for ear wax. 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 No claudication or known PAD, remote h/o smoking, bilateral ABIs WNL in February, 2020. Stable. Onychomycosis present on toenails.  HTN Currently normotensive. Home medication includes amlodipine 5 mg daily. - Continue amlodipine  HLD Lipid panel from 2019 with LDL 45, total cholesterol 110.Home medication includes atorvastatin 40 mg daily. - continue atorvastatin  OSA Patient has OSA and has a CPAP at home but has not been using it and does not want to use it in hospital.  FEN/GI: NPO PPx: SCDs  Disposition: admitted to med-surg pending surgical intervention for likely malignant colonic mass  Subjective:  Patient sleepy, but doing well after surgery. Reports that he is feeling ok and is very grateful.   Objective: Temp:  [97.4 F (36.3 C)-97.8 F (36.6 C)] 97.8 F (36.6 C) (05/10 0603) Pulse Rate:  [66-78] 76 (05/10 0603) Resp:  [16-18] 17 (05/10 0603) BP: (102-111)/(68-77) 103/77 (05/10 0603) SpO2:  [95 %-99 %] 95 % (05/10 0603) Weight:  [98.3 kg] 98.3 kg (05/09 0700) Physical Exam: General: Alert, oriented.  No acute distress. Cardiovascular: Irregularly irregular rhythm.  Normal rate.  No murmurs. Respiratory: Lungs clear to auscultation bilaterally. Abdomen: Soft, diffusely but mildly tender, ND.  Normal bowel sounds. Extremities: No swelling. SCDs  on and working  Laboratory: Recent Labs  Lab 10/06/19 0616 10/07/19 0654 10/09/19 0559  WBC 6.8 7.9 6.4  HGB 11.3* 12.5* 11.8*  HCT 33.7* 37.2* 35.1*  PLT 138* 151 148*   Recent Labs  Lab 10/05/19 1248 10/05/19 1248 10/06/19 0616 10/07/19 0654 10/09/19 0559  NA 140   < > 139 136 138  K 4.1   < > 3.7 3.7 4.1  CL 106   < > 107 106 105  CO2 22   < > '25 22 25  '$ BUN 12   < > 12 10 7*  CREATININE 0.70   < > 0.73 0.76 0.76  CALCIUM 8.9   < > 8.4* 8.5* 8.6*  PROT 6.4*  --   --   --   --   BILITOT 0.6  --   --   --   --   ALKPHOS 76  --   --   --   --   ALT 21  --   --   --   --   AST 23  --   --   --   --   GLUCOSE 104*   < > 120* 119* 108*   < > = values in this interval not displayed.   CEA 4.9 Pathology reports pending.  Imaging/Diagnostic Tests: 1. Irregular annular colonic mass in the cecum/ascending colon centered on the ileocecal valve region, compatible with primary colonic malignancy. No bowel obstruction. 2. Right ileocolic mesenteric lymphadenopathy compatible with metastatic disease. No additional findings highly suspicious for metastatic disease. 3. Few scattered tiny pulmonary nodules in both lungs, largest 3  mm. Recommend attention on follow-up chest CT in 3 months. 4. Coronary atherosclerosis. 5. Aortic Atherosclerosis  Gladys Damme, MD 10/10/2019, 6:32 AM PGY-1, [place] Intern pager: [phone], text pages welcome

## 2019-10-10 NOTE — Progress Notes (Signed)
The chaplain responded to a consult for prayer. The chaplain prayed with the patient as the patient prepared for a surgical procedure. The chaplain is available if needed.  Brion Aliment Chaplain Resident For questions concerning this note please contact me by pager [phone]

## 2019-10-10 NOTE — Progress Notes (Signed)
Patient is a 69-year-old male who underwent colonoscopy on 10/07/19 for rectal bleeding.  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   Oncology consultation placed.  [facility] will sign off.  Please contact us if we can be of any further assistance during this hospital stay.  Alison Baron-Johnson, PA-C [facility] [phone] 

## 2019-10-10 NOTE — Progress Notes (Signed)
    Subjective:  No cardiac symptoms Praying before surgery   Objective:  Vitals:   10/09/19 0700 10/09/19 1242 10/09/19 2334 10/10/19 0603  BP:  102/68 111/69 103/77  Pulse:  66 78 76  Resp:  18 16 17   Temp:  (!) 97.4 F (36.3 C) 97.8 F (36.6 C) 97.8 F (36.6 C)  TempSrc:  Oral Oral Oral  SpO2:  99% 99% 95%  Weight: 98.3 kg     Height:        Intake/Output from previous day:  Intake/Output Summary (Last 24 hours) at 10/10/2019 0830 Last data filed at 10/10/2019 0300 Gross per 24 hour  Intake 950 ml  Output 1400 ml  Net -450 ml    Physical Exam: Affect appropriate Healthy:  appears stated age HEENT: normal Neck supple with no adenopathy JVP normal no bruits no thyromegaly Lungs clear with no wheezing and good diaphragmatic motion Heart:  S1/S2 no murmur, no rub, gallop or click PMI normal Abdomen: benighn, BS positve, no tenderness, no AAA no bruit.  No HSM or HJR Distal pulses intact with no bruits No edema Neuro non-focal Skin warm and dry No muscular weakness   Lab Results: Basic Metabolic Panel: Recent Labs    10/09/19 0559  NA 138  K 4.1  CL 105  CO2 25  GLUCOSE 108*  BUN 7*  CREATININE 0.76  CALCIUM 8.6*   Liver Function Tests: No results for input(s): AST, ALT, ALKPHOS, BILITOT, PROT, ALBUMIN in the last 72 hours. No results for input(s): LIPASE, AMYLASE in the last 72 hours. CBC: Recent Labs    10/09/19 0559 10/10/19 0739  WBC 6.4 6.0  HGB 11.8* 11.4*  HCT 35.1* 33.6*  MCV 89.3 89.1  PLT 148* 154    Imaging: No results found.  Cardiac Studies:  ECG: afib rate 78    Telemetry:  afib chronic   Echo: 01/28/19 EF 30-35%   Medications:   . amLODipine  5 mg Oral q AM  . atorvastatin  40 mg Oral QHS  . bupivacaine liposome  20 mL Infiltration Once  . carbamide peroxide  5 drop Left EAR BID  . cetirizine  10 mg Oral Daily  . chlorhexidine  1 application Topical Once  . clotrimazole   Topical BID  . fluticasone  2 spray Each  Nare Daily  . guaiFENesin  600 mg Oral BID  . melatonin  3 mg Oral QHS  . metoprolol tartrate  100 mg Oral BID  . sacubitril-valsartan  1 tablet Oral BID      Assessment/Plan:   1. CHF:  Presumed non ischemic Functional class one continue lopressor and entresto some non compliance prior to admission Keep I/O's even post op  2. Aifb:  Chronic rate control ok may need iv cardizem post op anticoagulation held due to GI bleed and colon cancer surgery   Jenkins Rouge 10/10/2019, 8:30 AM

## 2019-10-10 NOTE — Op Note (Signed)
Preoperative diagnosis: ascending colon mass  Postoperative diagnosis: same   Procedure: laparoscopic right colectomy with ileocolic anastomosis  Surgeon: Gurney Maxin, M.D.  Asst: Alferd Apa  Anesthesia: general  Indications for procedure: JAYON LEFEBURE is a 69 y.o. year old male with symptoms of bleeding per rectum and work up consistent with colon cancer of right colon. After discussion decision was made to proceed with laparoscopic approach for right colectomy with anastomosis.  Description of procedure: The patient was brought into the operative suite. Anesthesia was administered with General endotracheal anesthesia. WHO checklist was applied. The patient was then placed in supine position. The area was prepped and draped in the usual sterile fashion.  A small transverse incision was made in the left upper quadrant. A 40mm trocar was used to gain access to the peritoneal cavity by optical entry technique. Pneumoperitoneum was applied with a high flow and low pressure. The laparoscope was reinserted to confirm position. 3 additional trocars were placed 1 5 mm trocar in the supraumbilical area, 1 5 mm trocar in the upper midline and 1 5 mm trocar in the left lower quadrant.  The liver showed no superficial masses. The right colon was identified and tattoo areas were readily evident. The white line of Toldt was identified and divided with harmonic scalpel. There were adhesions of the right wall to the terminal ileum that was divided with harmonic scalpel to improve mobility. Next, the omentum was flipped up and the proximal transverse colon was separated from the omentum with harmonic scalpel. This was continued to the hepatic flexure which was divided with harmonic scalpel. Next, the mesentery of the terminal ileum was divided with harmonic scalpel to create a window. The ileocolic vessels were identified however due to concern for node positive and due to the high about of visceral fat  decision was made to take the vessels after extracorporealization. Therefore, the supraumbilical and mid upper incisions were connected. Cautery was used to divide the subcutaneous tissue and fascia entered in the midline. A wound protector was put in place. Some additional adhesions of the right colon were divided with blunt dissection to allo the right colon, appendix and terminal ileum to come through the incision. The ileocolic vessels and proximal branch of the middle colic were identified and divided at their origin. The terminal ileum and transverse colon were divided with 75 mm GIA blue load linear staplers. An anastomosis was created with 75 mm GIA blue load linear staplers. The enterotomy was closed with 60 mm blue load TA stapler. 3-0 silks were use to imbricate the staple line and 3-0 silk suture used for anti-tension stitch as well. The right dissection was inspected and hemostasis intact. The area was irrigated. Trocars were removed.  A clean change over occurred.  Exparel/Marcaine mix was injected in the rectus muscle along the larger incision. The fascia was closed with 0 PDS in running fashion. The skin was closed with staples for the midline incision and 2 left sided port sites. Bandage was put in place for dressing. The patient tolerated the procedure and was brought to pacu in stable condition.  Findings: bulky right colon mass with large pericolic nodes, no evidence of liver masses  Specimen: right colon  Implant: none   Blood loss: 30 ml  Local anesthesia: 50 ml Exparel:Marcaine Mix  Complications: none  Gurney Maxin, M.D. General, Bariatric, & Minimally Invasive Surgery [facility]

## 2019-10-11 ENCOUNTER — Encounter: Payer: Self-pay | Admitting: *Deleted

## 2019-10-11 DIAGNOSIS — C7A8 Other malignant neuroendocrine tumors: Secondary | ICD-10-CM

## 2019-10-11 DIAGNOSIS — Z9049 Acquired absence of other specified parts of digestive tract: Secondary | ICD-10-CM

## 2019-10-11 DIAGNOSIS — I5020 Unspecified systolic (congestive) heart failure: Secondary | ICD-10-CM

## 2019-10-11 LAB — BASIC METABOLIC PANEL
Anion gap: 7 (ref 5–15)
BUN: 6 mg/dL — ABNORMAL LOW (ref 8–23)
CO2: 22 mmol/L (ref 22–32)
Calcium: 8.1 mg/dL — ABNORMAL LOW (ref 8.9–10.3)
Chloride: 110 mmol/L (ref 98–111)
Creatinine, Ser: 0.67 mg/dL (ref 0.61–1.24)
GFR calc Af Amer: >60 mL/min (ref >60)
GFR calc non Af Amer: >60 mL/min (ref >60)
Glucose, Bld: 120 mg/dL — ABNORMAL HIGH (ref 70–99)
Potassium: 3.7 mmol/L (ref 3.5–5.1)
Sodium: 139 mmol/L (ref 135–145)

## 2019-10-11 LAB — CBC
HCT: 31.5 % — ABNORMAL LOW (ref 39.0–52.0)
Hemoglobin: 10.5 g/dL — ABNORMAL LOW (ref 13.0–17.0)
MCH: 29.7 pg (ref 26.0–34.0)
MCHC: 33.3 g/dL (ref 30.0–36.0)
MCV: 89 fL (ref 80.0–100.0)
Platelets: 167 10*3/uL (ref 150–400)
RBC: 3.54 MIL/uL — ABNORMAL LOW (ref 4.22–5.81)
RDW: 14 % (ref 11.5–15.5)
WBC: 9 10*3/uL (ref 4.0–10.5)
nRBC: 0 % (ref 0.0–0.2)

## 2019-10-11 LAB — SURGICAL PATHOLOGY

## 2019-10-11 LAB — GLUCOSE, CAPILLARY: Glucose-Capillary: 130 mg/dL — ABNORMAL HIGH (ref 70–99)

## 2019-10-11 MED ORDER — GLUCERNA SHAKE PO LIQD: 237.0000 mL | Freq: Three times a day (TID) | ORAL | Status: DC

## 2019-10-11 MED ORDER — TRAZODONE HCL 50 MG PO TABS
50.0000 mg | ORAL_TABLET | Freq: Every day | ORAL | Status: DC
  Administered 2019-10-11 – 2019-10-12 (×2): 50 mg via ORAL
  Filled 2019-10-11 (×2): qty 1

## 2019-10-11 NOTE — Progress Notes (Signed)
Family Medicine Teaching Service Daily Progress Note Intern Pager: [phone]  Patient name: [name] Medical record number: [id number] Date of birth: [birth year] Age: 69 y.o. Gender: male  Primary Care Provider: Patient, No Pcp Per Consultants: GI, surgery Code Status: full  Pt Overview and Major Events to Date:  5/5 admitted for GI bleed, hgb 11.4 5/7 colonoscopy revealing fungating mass 5/10 right colectomy with anastamosis  Assessment and Plan: [name] [facility] a 68 y.o.malepresenting with rectal bleeding, found to have fungating mass in colon concerning for malignancy. PMH is significant forA. Fib,HTN, HLD, CHF,OSA.  GI Bleed 2/2 endocrine tumor  s/p R colectomy w/ anastomosis POD #1 [name] is a 69 year old man presenting with GI bleed x1 week. Has not has eliquis (for A fib) since 4/29. Asymptomatic, hgb remains stable above 10.  GI was consulted and performed colonoscopy that revealed a large, fungating, ulcerated, polypoid mass in cecum, most likely a malignancy. 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 A polyp in the descending colonwas also removed during colonoscopy which proved to be a tubular adenoma with high grade dysplasia; however, the stalk had clean margins.  CEA WNL at 3.9. CT chest/abd/pelv showed mass discussed above as well as right ileocolic mesenteric lymphadenopathy compatible with metastic disease. Surgery has been consulted, appreciate their recommendations. Patient underwent successful resection yesterday, resulting in an anastomosis. He is doing well post-operatively, UOP is appropriate, pain controlled, tolerating CLD, 1x emesis with coffee, passing flatus and had loose BM. - GI consulted, appreciate recs. - Surgery consulted, appreciate recs - will need to consult oncology - Cardiac telemetry - Transfuse if hgb <8 - AM  CBC, BMP - Clear liquid diet per surgery - SCDs for dvt ppx -PT/OT   A Fib HR today ranges from 70-92. Patient is so far rate controlled post-op. Previously converted in 2015 but NSR did not hold, is rate controlled with Lopressor 100 mg twice daily, takes Eliquis 5 mg twice daily. Last dose of Eliquis was 4/29 due to rectal bleeding.  CHA2DS2-VASc score of 4 (age, CHF, HTN, moderate non-obstructing atherosclerosis in LAD). For more cardiac information, see HFrEF section below.  - continue lopressor BID - hold eliquis  - cardiac monitoring  HFrEF, EF 30-35% Weight mildly increased from 98.3kg to 99.2kg today. UOP appropriate at 1.02L. Non-ischemic cardiomyopathy, thought most likely to be due to tachycardia.Last Echo 10/2018 with LVEF 30-35%.NYHA class I, no SOB when able to afford and take [name] (currently he is able to afford it). Patient had heart cath in 2015 that showed moderate diffuse atherosclerosis in the LAD artery without significant stenosis, otherwise widely patent cardiac arteries.Homemedicationsinclude Entresto 24-26 mg twice daily, furosemide 40 mg twice daily, Lopressor 100 mg twice daily. Patient appears euvolemic. Cardiology consulted for optimization prior to surgery: acceptable risk for planned procedure without additional cardiac work up. Recommend strict I/Os, daily weights, avoid significant volume overload. If he develops an ileus after surgery, can use IV metoprolol or IV cardizem to help with ventricular rate control. - Continue home medications: lopressor, entresto - Hold lasix  - Strict I/Os - Daily Weights - Avoid volume overload  Pulmonary nodules Few scattered pulmonary nodule in both lungs, largest 16m.  Pt is a reported nonsmoker.   - 3 month CT chest follow up.   Thrombocytopenia- Improved Platelets WNL at 170 upon admission, reduced to 138 on 5/6, stable today at 167. Previously thrombocytopenia noted in  January 2020 to 103. Will continue to  monitor, recommend outpatient follow up.  -AM CBC  Groin rash Patient haspruriticscaling plaque along intertriginous zones of inguinal folds and gluteal cleft present for greater than 1 year. Most likely tinea cruris, will treat with antifungal cream.  Patient states that he feels great relief with the application of this antifungal cream, has not checked for progress since surgery yesterday. - Clotrimazole cream to affected areas   Sinus pressure, L ear fullness Patient reports seasonal allergies. Takes xyzal at home which he states relieves sinus pressure and feeling of fullness he regularly experiences in L ear. the hospital formulary does not carry xyzal, also called levocetirizine. However, we do have cetirizine, and will try this and flonase. Also debrox for ear wax. 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 No claudication or known PAD, remote h/o smoking, bilateral ABIs WNL in February, 2020. Stable. Onychomycosis present on toenails.  HTN Currently normotensive. Home medication includes amlodipine 5 mg daily. - Continue amlodipine  HLD Lipid panel from 2019 with LDL 45, total cholesterol 110.Home medication includes atorvastatin 40 mg daily. - continue atorvastatin  OSA Patient has OSA and has a CPAP at home but has not been using it and does not want to use it in hospital.  FEN/GI: NPO PPx: SCDs  Disposition: admitted to med-surg, disposition to home pending recovery from surgery  Subjective:  Patient feeling overall well today, comfortable, no pain. Tolerating CLD well with apple juice, nausea/emesis with coffee x1. Passing flatus, one loose BM last night.  Objective: Temp:  [97.7 F (36.5 C)-98.4 F (36.9 C)] 97.9 F (36.6 C) (05/11 0433) Pulse Rate:  [70-92] 70 (05/11 0433) Resp:  [16-21] 20 (05/11 0433) BP: (99-151)/(58-94) 114/66 (05/11 0433) SpO2:  [92 %-99 %] 92 %  (05/11 0433) Weight:  [99.2 kg] 99.2 kg (05/11 0500) Physical Exam: General: Alert, oriented.  No acute distress. Cardiovascular: Irregularly irregular rhythm.  Normal rate.  No murmurs. Respiratory: Lungs clear to auscultation bilaterally. Abdomen: Soft, mildly tender at insiscion sites, ND.  Normal bowel sounds. Derm: honeycomb dressing clean, dry, intact on abdomen Extremities: No swelling. SCDs on and working  Laboratory: Recent Labs  Lab 10/09/19 0559 10/10/19 0739 10/11/19 0315  WBC 6.4 6.0 9.0  HGB 11.8* 11.4* 10.5*  HCT 35.1* 33.6* 31.5*  PLT 148* 154 167   Recent Labs  Lab 10/05/19 1248 10/06/19 0616 10/09/19 0559 10/10/19 0739 10/11/19 0315  NA 140   < > 138 139 139  K 4.1   < > 4.1 4.0 3.7  CL 106   < > 105 107 110  CO2 22   < > '25 24 22  '$ BUN 12   < > 7* 7* 6*  CREATININE 0.70   < > 0.76 0.82 0.67  CALCIUM 8.9   < > 8.6* 8.5* 8.1*  PROT 6.4*  --   --   --   --   BILITOT 0.6  --   --   --   --   ALKPHOS 76  --   --   --   --   ALT 21  --   --   --   --   AST 23  --   --   --   --   GLUCOSE 104*   < > 108* 108* 120*   < > = values in this interval not displayed.   CEA  4.9 Cecum/ascending colon pathology: well -differentiated neuroendocrine tumor; Ki-67 will be ordered and reported in pathology addendum Descending colon pathology: tubular adenoma with high grade dysplasia; stalk margin is negative for dysplasia  Imaging/Diagnostic Tests: 1. Irregular annular colonic mass in the cecum/ascending colon centered on the ileocecal valve region, compatible with primary colonic malignancy. No bowel obstruction. 2. Right ileocolic mesenteric lymphadenopathy compatible with metastatic disease. No additional findings highly suspicious for metastatic disease. 3. Few scattered tiny pulmonary nodules in both lungs, largest 3 mm. Recommend attention on follow-up chest CT in 3 months. 4. Coronary atherosclerosis. 5. Aortic Atherosclerosis  Gladys Damme,  MD 10/11/2019, 6:59 AM PGY-1, [place] Intern pager: [phone], text pages welcome

## 2019-10-11 NOTE — Progress Notes (Signed)
Per NT patient had one emesis in the course of eating breakfast. Patient reassured and made comfortable in bed. Will cont. to monitor.

## 2019-10-11 NOTE — Progress Notes (Signed)
Brief oncology note:  We were notified of request for consult on this patient on 10/10/2019.  He was in the OR that day.  I have reviewed this case with Dr. Benay Spice who will be his medical oncologist.  We will plan to either do a full consult on him on Thursday 10/13/2019 versus scheduling him for an outpatient follow-up at the cancer center after discharge.   Mikey Bussing, DNP, AGPCNP-BC, AOCNP Mon/Tues/Thurs/Fri 7am-5pm; Off Wednesdays Cell: [phone]

## 2019-10-11 NOTE — Progress Notes (Signed)
Patient encouraged to get up and ambulate but stated "I am comfortable right now, I don't want to get up". Educated patient the need for early ambulation after surgery. Verbalized understanding and suggested to get later in the day. Will cont. to monitor.

## 2019-10-11 NOTE — Evaluation (Signed)
Physical Therapy Evaluation and Discharge Patient Details Name: [name] MRN: [id number] DOB: Apr 15, 1951 Today's Date: 10/11/2019   History of Present Illness  69 y.o. male presented 10/05/19 with rectal bleeding. 5/7 colonoscopy revealed malignancy. 5/10 laparoscopic rt colectomy with ileocolic anastomosis PMH is significant for A. Fib, HTN, HLD, CHF EF 30-35%, OSA, rt 2nd toe amputation  Clinical Impression   Patient evaluated by Physical Therapy with no further acute PT needs identified. All education has been completed and the patient has no further questions.  PT is signing off. Thank you for this referral.     Follow Up Recommendations No PT follow up    Equipment Recommendations  None recommended by PT    Recommendations for Other Services       Precautions / Restrictions Precautions Precautions: None      Mobility  Bed Mobility                  Transfers Overall transfer level: Independent Equipment used: None                Ambulation/Gait Ambulation/Gait assistance: Independent Gait Distance (Feet): 400 Feet Assistive device: None Gait Pattern/deviations: WFL(Within Functional Limits)   Gait velocity interpretation: >2.62 ft/sec, indicative of community ambulatory General Gait Details: able to vary speed, walk backwards, tandem walk   Stairs            Wheelchair Mobility    Modified Rankin (Stroke Patients Only)       Balance Overall balance assessment: Independent                                           Pertinent Vitals/Pain Pain Assessment: No/denies pain    Home Living Family/patient expects to be discharged to:: Private residence Living Arrangements: Spouse/significant other Available Help at Discharge: Family;Available 24 hours/day Type of Home: House Home Access: Stairs to enter Entrance Stairs-Rails: Right Entrance Stairs-Number of Steps: 3 Home Layout: Two level;Bed/bath upstairs Home  Equipment: None      Prior Function Level of Independence: Independent         Comments: 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 [employer]        Extremity/Trunk Assessment   Upper Extremity Assessment Upper Extremity Assessment: Overall WFL for tasks assessed    Lower Extremity Assessment Lower Extremity Assessment: Overall WFL for tasks assessed    Cervical / Trunk Assessment Cervical / Trunk Assessment: Normal  Communication   Communication: HOH(rt ear worse)  Cognition Arousal/Alertness: Awake/alert Behavior During Therapy: WFL for tasks assessed/performed Overall Cognitive Status: Within Functional Limits for tasks assessed                                        General Comments General comments (skin integrity, edema, etc.): pt amazed he has had no pain post-op    Exercises     Assessment/Plan    PT Assessment Patent does not need any further PT services  PT Problem List         PT Treatment Interventions      PT Goals (Current goals can be found in the Care Plan section)  Acute Rehab PT Goals Patient Stated Goal: get home PT Goal Formulation: All assessment and education complete, DC therapy    Frequency  Barriers to discharge        Co-evaluation               AM-PAC PT "6 Clicks" Mobility  Outcome Measure Help needed turning from your back to your side while in a flat bed without using bedrails?: None Help needed moving from lying on your back to sitting on the side of a flat bed without using bedrails?: None Help needed moving to and from a bed to a chair (including a wheelchair)?: None Help needed standing up from a chair using your arms (e.g., wheelchair or bedside chair)?: None Help needed to walk in hospital room?: None Help needed climbing 3-5 steps with a railing? : None 6 Click Score: 24    End of Session   Activity Tolerance: Patient tolerated treatment well Patient left: in chair;with call bell/phone within  reach Nurse Communication: Mobility status PT Visit Diagnosis: Difficulty in walking, not elsewhere classified (R26.2)    Time: [id number] PT Time Calculation (min) (ACUTE ONLY): 17 min   Charges:   PT Evaluation $PT Eval Low Complexity: 1 Low           Arby Barrette, PT Pager [phone]   Rexanne Mano 10/11/2019, 4:36 PM

## 2019-10-11 NOTE — Progress Notes (Addendum)
Progress Note  Patient Name: [name] Date of Encounter: 10/11/2019  Primary Cardiologist: Larae Grooms, MD   Subjective   Pathology showed  tubular adenoma with high grade dysplasia. Surgery yesterday went well with no complications. Patient had some nasuea and vomiting x 1 after drinking coffee but feeling better. No chest pain. Rates are still controlled.   Inpatient Medications    Scheduled Meds:  acetaminophen  1,000 mg Oral Q6H   amLODipine  5 mg Oral q AM   atorvastatin  40 mg Oral QHS   carbamide peroxide  5 drop Left EAR BID   cetirizine  10 mg Oral Daily   clotrimazole   Topical BID   enoxaparin (LOVENOX) injection  40 mg Subcutaneous Q24H   feeding supplement  237 mL Oral BID BM   fluticasone  2 spray Each Nare Daily   gabapentin  300 mg Oral BID   guaiFENesin  600 mg Oral BID   melatonin  3 mg Oral QHS   metoprolol tartrate  100 mg Oral BID   saccharomyces boulardii  250 mg Oral BID   sacubitril-valsartan  1 tablet Oral BID   Continuous Infusions:  sodium chloride 75 mL/hr at 10/11/19 0249   PRN Meds: acetaminophen, alum & mag hydroxide-simeth, diphenhydrAMINE **OR** diphenhydrAMINE, metoprolol tartrate, morphine injection, ondansetron **OR** ondansetron (ZOFRAN) IV, oxyCODONE, sodium chloride, traMADol   Vital Signs    Vitals:   10/11/19 0025 10/11/19 0253 10/11/19 0433 10/11/19 0500  BP: 104/69 99/67 114/66   Pulse: 92 82 70   Resp: 20 19 20    Temp: 98.4 F (36.9 C) 98.4 F (36.9 C) 97.9 F (36.6 C)   TempSrc: Oral Oral Oral   SpO2: 92% 95% 92%   Weight:    99.2 kg  Height:        Intake/Output Summary (Last 24 hours) at 10/11/2019 0810 Last data filed at 10/11/2019 [id number] Gross per 24 hour  Intake 3693.04 ml  Output 1045 ml  Net 2648.04 ml   Last 3 Weights 10/11/2019 10/09/2019 10/05/2019  Weight (lbs) 218 lb 12.8 oz 216 lb 11.2 oz 212 lb  Weight (kg) 99.247 kg 98.294 kg 96.163 kg      Telemetry    N/A - Personally  Reviewed  ECG    No new - Personally Reviewed  Physical Exam   GEN: No acute distress.   Neck: No JVD Cardiac: RRR, no murmurs, rubs, or gallops.  Respiratory: Clear to auscultation bilaterally. GI: Soft, nontender, non-distended  MS: No edema; No deformity. Neuro:  Nonfocal  Psych: Normal affect   Labs    High Sensitivity Troponin:  No results for input(s): TROPONINIHS in the last 720 hours.    Chemistry Recent Labs  Lab 10/05/19 1248 10/06/19 0616 10/09/19 0559 10/10/19 0739 10/11/19 0315  NA 140   < > 138 139 139  K 4.1   < > 4.1 4.0 3.7  CL 106   < > 105 107 110  CO2 22   < > 25 24 22   GLUCOSE 104*   < > 108* 108* 120*  BUN 12   < > 7* 7* 6*  CREATININE 0.70   < > 0.76 0.82 0.67  CALCIUM 8.9   < > 8.6* 8.5* 8.1*  PROT 6.4*  --   --   --   --   ALBUMIN 3.6  --   --   --   --   AST 23  --   --   --   --  ALT 21  --   --   --   --   ALKPHOS 76  --   --   --   --   BILITOT 0.6  --   --   --   --   GFRNONAA >60   < > >60 >60 >60  GFRAA >60   < > >60 >60 >60  ANIONGAP 12   < > 8 8 7    < > = values in this interval not displayed.     Hematology Recent Labs  Lab 10/09/19 0559 10/10/19 0739 10/11/19 0315  WBC 6.4 6.0 9.0  RBC 3.93* 3.77* 3.54*  HGB 11.8* 11.4* 10.5*  HCT 35.1* 33.6* 31.5*  MCV 89.3 89.1 89.0  MCH 30.0 30.2 29.7  MCHC 33.6 33.9 33.3  RDW 14.3 14.1 14.0  PLT 148* 154 167    BNPNo results for input(s): BNP, PROBNP in the last 168 hours.   DDimer No results for input(s): DDIMER in the last 168 hours.   Radiology    No results found.  Cardiac Studies   Echo 01/2019  1. The left ventricle has moderate-severely reduced systolic function,  with an ejection fraction of 30-35%. The cavity size was severely dilated.  Left ventricular diastolic function could not be evaluated secondary to  atrial fibrillation. Left  ventricular diffuse hypokinesis.   2. The right ventricle has normal systolic function. The cavity was  normal. There is  no increase in right ventricular wall thickness.   3. Left atrial size was severely dilated.   4. There is moderate mitral annular calcification present. Mitral valve  regurgitation is moderate by color flow Doppler. The MR jet is  posteriorly-directed.   5. The aortic valve is tricuspid. Mild sclerosis of the aortic valve.   6. The aorta is normal unless otherwise noted.   Patient Profile     69 y.o. male with pmh of Afib, HTN, HLD, CHF, OSA who came in with GIB. Cardiology consulted for pre-op evaluation.   Assessment & Plan    CHF/presumed nonischemic cardiomyopathy - EF 30-35% on echo 01/2019 - lopressor and entresto - home lasix 40 mg BID>>held prior to surgery - euvolemic on exam  Permanent Afib - rate controlled with BB - Eliquis held for GIB>> resume post-procedure per surgery recs  GIB 2/2 colonic mass - pathology showed tubular adenoma with high grade dysplasia - oncology on board - Hgb 10.5 today - per surgery/IM  HTN - amlodipine 5mg  and lopressor - pressures stable  HLD - atorvastatin - LDL 45 in 2019  For questions or updates, please contact [place] Please consult [email or web address] for contact info under        Signed, Cadence Ninfa Meeker, PA-C  10/11/2019, 8:10 AM    Doing well post op. Euvolemic resume lasix to keep  I/O's equal Resume eliquis for afib when ok with surgery Rate control is fine  Jenkins Rouge MD [facility]

## 2019-10-11 NOTE — Anesthesia Postprocedure Evaluation (Signed)
Anesthesia Post Note  Patient: AZAREL MCNIFF  Procedure(s) Performed: LAPAROSCOPIC PARTIAL COLECTOMY WITH ILEOCOLIC ANASTOMOSIS (N/A Abdomen)     Patient location during evaluation: PACU Anesthesia Type: General Level of consciousness: awake and alert Pain management: pain level controlled Vital Signs Assessment: post-procedure vital signs reviewed and stable Respiratory status: spontaneous breathing, nonlabored ventilation, respiratory function stable and patient connected to nasal cannula oxygen Cardiovascular status: blood pressure returned to baseline and stable Postop Assessment: no apparent nausea or vomiting Anesthetic complications: no    Last Vitals:  Vitals:   10/11/19 1007 10/11/19 1246  BP: 125/67 126/69  Pulse: (!) 41 65  Resp: 19 16  Temp: 36.7 C 36.6 C  SpO2: 94% 97%    Last Pain:  Vitals:   10/11/19 1246  TempSrc: Oral  PainSc:                  Fumiko Cham L Anisha Starliper

## 2019-10-11 NOTE — Progress Notes (Addendum)
1 Day Post-Op  Subjective: CC: Doing well. No abdominal pain. Was tolerating CLD yesterday. Did have an episode of emesis this am after coffee but ate CLD tray after without further emesis. Passing flatus and had a loose non-bloody BM yesterday night and this am. Mobilizing in the room with walker. OOB in chair this AM  Objective: Vital signs in last 24 hours: Temp:  [97.7 F (36.5 C)-98.4 F (36.9 C)] 98 F (36.7 C) (05/11 1007) Pulse Rate:  [41-92] 41 (05/11 1007) Resp:  [16-21] 19 (05/11 1007) BP: (99-151)/(58-94) 125/67 (05/11 1007) SpO2:  [92 %-99 %] 94 % (05/11 1007) Weight:  [99.2 kg] 99.2 kg (05/11 0500) Last BM Date: 10/09/19  Intake/Output from previous day: 05/10 0701 - 05/11 0700 In: 8309 [P.O.:600; I.V.:2843; IV Piggyback:250] Out: 4076 [Urine:1020; Blood:25] Intake/Output this shift: No intake/output data recorded.  PE: Gen:  Alert, NAD, pleasant Pulm: Normal rate and effort  Abd: Soft, mild distension, appropriately tender around midline wound on the right. Slightly active bowel sounds. Midline wound with honeycomb dressing in place with staples underneath c/d/i. Laparoscopic incisions with bandages in place, c/d/i Psych: A&Ox3  Skin: no rashes noted, warm and dry  Lab Results:  Recent Labs    10/10/19 0739 10/11/19 0315  WBC 6.0 9.0  HGB 11.4* 10.5*  HCT 33.6* 31.5*  PLT 154 167   BMET Recent Labs    10/10/19 0739 10/11/19 0315  NA 139 139  K 4.0 3.7  CL 107 110  CO2 24 22  GLUCOSE 108* 120*  BUN 7* 6*  CREATININE 0.82 0.67  CALCIUM 8.5* 8.1*   PT/INR No results for input(s): LABPROT, INR in the last 72 hours. CMP     Component Value Date/Time   NA 139 10/11/2019 0315   NA 142 07/16/2018 1055   K 3.7 10/11/2019 0315   CL 110 10/11/2019 0315   CO2 22 10/11/2019 0315   GLUCOSE 120 (H) 10/11/2019 0315   BUN 6 (L) 10/11/2019 0315   BUN 14 07/16/2018 1055   CREATININE 0.67 10/11/2019 0315   CREATININE 0.64 (L) 08/29/2015 1004    CALCIUM 8.1 (L) 10/11/2019 0315   PROT 6.4 (L) 10/05/2019 1248   PROT 6.3 01/08/2018 1527   ALBUMIN 3.6 10/05/2019 1248   ALBUMIN 4.0 01/08/2018 1527   AST 23 10/05/2019 1248   ALT 21 10/05/2019 1248   ALKPHOS 76 10/05/2019 1248   BILITOT 0.6 10/05/2019 1248   BILITOT 0.6 01/08/2018 1527   GFRNONAA >60 10/11/2019 0315   GFRAA >60 10/11/2019 0315   Lipase     Component Value Date/Time   LIPASE 34 06/15/2018 0759       Studies/Results: No results found.  Anti-infectives: Anti-infectives (From admission, onward)   Start     Dose/Rate Route Frequency Ordered Stop   10/10/19 0846  sodium chloride 0.9 % with cefoTEtan (CEFOTAN) ADS Med    Note to Pharmacy: Henrine Screws   : cabinet override      10/10/19 0846 10/10/19 1035   10/10/19 0845  cefoTEtan (CEFOTAN) 2 g in sodium chloride 0.9 % 100 mL IVPB     2 g 200 mL/hr over 30 Minutes Intravenous  Once 10/10/19 0842 10/10/19 2307   10/09/19 1400  neomycin (MYCIFRADIN) tablet 1,000 mg     1,000 mg Oral 3 times per day 10/09/19 0943 10/09/19 2138   10/09/19 1400  metroNIDAZOLE (FLAGYL) tablet 1,000 mg     1,000 mg Oral 3 times per day 10/09/19  9155 10/09/19 2137   10/09/19 1030  cefoTEtan (CEFOTAN) 2 g in sodium chloride 0.9 % 100 mL IVPB  Status:  Discontinued     2 g 200 mL/hr over 30 Minutes Intravenous On call to O.R. 10/09/19 0271 10/10/19 0559       Assessment/Plan CHF, EF 30-35% HTN HLD OSA A fib, on eliquis - currently being held  Ascending colon mass S/p laparoscopic right colectomy with ileocolic anastomosis - Dr. 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 Ki-67 is approximately 1% consistent with a low grade per report - Colonoscopy Path with Tubular adenoma with high grade dysplasia for descending colon poylpectomy. Stalk margin is negative for dysplasia.  - CEA 3.9 - CT A/P showed colonic mass of the cecum/ascending colon with  Right ileocolic mesenteric lymphadenopathy compatible with metastatic disease - Surgical Path Pending  - Med Onc following - Leave on CLD today - Mobilize, Pulm Toilet   FEN - CLD VTE - SCDs, Lovenox  ID - Cefotetan peri-op. None currently needed Foley - Removed Follow-Up - Dr. Kieth Brightly    LOS: 5 days    Jillyn Ledger , [facility] 10/11/2019, 10:29 AM Please see Amion for pager number during day hours 7:00am-4:30pm

## 2019-10-11 NOTE — Progress Notes (Signed)
Per On-call Provider, patient cardiac monitor can be d/ced since there is no order.

## 2019-10-12 ENCOUNTER — Other Ambulatory Visit: Payer: Self-pay | Admitting: Interventional Cardiology

## 2019-10-12 DIAGNOSIS — I5020 Unspecified systolic (congestive) heart failure: Secondary | ICD-10-CM

## 2019-10-12 LAB — BASIC METABOLIC PANEL
Anion gap: 9 (ref 5–15)
BUN: 7 mg/dL — ABNORMAL LOW (ref 8–23)
CO2: 23 mmol/L (ref 22–32)
Calcium: 8.5 mg/dL — ABNORMAL LOW (ref 8.9–10.3)
Chloride: 104 mmol/L (ref 98–111)
Creatinine, Ser: 0.64 mg/dL (ref 0.61–1.24)
GFR calc Af Amer: >60 mL/min (ref >60)
GFR calc non Af Amer: >60 mL/min (ref >60)
Glucose, Bld: 116 mg/dL — ABNORMAL HIGH (ref 70–99)
Potassium: 3.9 mmol/L (ref 3.5–5.1)
Sodium: 136 mmol/L (ref 135–145)

## 2019-10-12 LAB — CBC
HCT: 34.5 % — ABNORMAL LOW (ref 39.0–52.0)
Hemoglobin: 11.4 g/dL — ABNORMAL LOW (ref 13.0–17.0)
MCH: 30 pg (ref 26.0–34.0)
MCHC: 33 g/dL (ref 30.0–36.0)
MCV: 90.8 fL (ref 80.0–100.0)
Platelets: 194 10*3/uL (ref 150–400)
RBC: 3.8 MIL/uL — ABNORMAL LOW (ref 4.22–5.81)
RDW: 14.2 % (ref 11.5–15.5)
WBC: 10 10*3/uL (ref 4.0–10.5)
nRBC: 0 % (ref 0.0–0.2)

## 2019-10-12 MED ORDER — CLOTRIMAZOLE 1 % EX CREA: TOPICAL_CREAM | Freq: Two times a day (BID) | CUTANEOUS | 0 refills | Status: AC

## 2019-10-12 MED ORDER — FUROSEMIDE 40 MG PO TABS
40.0000 mg | ORAL_TABLET | Freq: Every day | ORAL | Status: DC
  Filled 2019-10-12 (×2): qty 1

## 2019-10-12 MED ORDER — FUROSEMIDE 40 MG PO TABS: 40.0000 mg | ORAL_TABLET | Freq: Every day | ORAL | Status: DC

## 2019-10-12 MED ORDER — OXYCODONE HCL 5 MG PO TABS: 5.0000 mg | ORAL_TABLET | Freq: Four times a day (QID) | ORAL | 0 refills | Status: DC | PRN

## 2019-10-12 NOTE — Telephone Encounter (Signed)
Prescription refill request for Eliquis received.  Last office visit: Irish Lack, 01/25/2019 Scr: 0.67, 10/11/2018 Age: 69 y.o. Weight: 99.2 kg   Prescription refill sent.

## 2019-10-12 NOTE — Progress Notes (Addendum)
2 Days Post-Op  Subjective: CC: Doing well. No abdominal pain, n/v. Tolerating solids. Passing flatus. BM yesterday and this morning that he reports was soft. Mobilizing without difficulty. Voiding without difficulty.   Objective: Vital signs in last 24 hours: Temp:  [97.8 F (36.6 C)-98.4 F (36.9 C)] 98.1 F (36.7 C) (05/12 0443) Pulse Rate:  [41-78] 46 (05/12 0443) Resp:  [16-20] 20 (05/11 2125) BP: (125-155)/(67-74) 152/67 (05/12 0443) SpO2:  [94 %-97 %] 94 % (05/12 0443) Last BM Date: 10/09/19  Intake/Output from previous day: No intake/output data recorded. Intake/Output this shift: No intake/output data recorded.  PE: Gen:  Alert, NAD, pleasant Pulm: Normal rate and effort  Abd: Soft, ND, appropriately tender around midline wound on the right. +BS. Midline wound with honeycomb dressing in place with staples underneath c/d/i. Laparoscopic incisions with bandages in place, c/d/i Psych: A&Ox3  Skin: no rashes noted, warm and dry  Lab Results:  Recent Labs    10/11/19 0315 10/12/19 0703  WBC 9.0 10.0  HGB 10.5* 11.4*  HCT 31.5* 34.5*  PLT 167 194   BMET Recent Labs    10/11/19 0315 10/12/19 0703  NA 139 136  K 3.7 3.9  CL 110 104  CO2 22 23  GLUCOSE 120* 116*  BUN 6* 7*  CREATININE 0.67 0.64  CALCIUM 8.1* 8.5*   PT/INR No results for input(s): LABPROT, INR in the last 72 hours. CMP     Component Value Date/Time   NA 136 10/12/2019 0703   NA 142 07/16/2018 1055   K 3.9 10/12/2019 0703   CL 104 10/12/2019 0703   CO2 23 10/12/2019 0703   GLUCOSE 116 (H) 10/12/2019 0703   BUN 7 (L) 10/12/2019 0703   BUN 14 07/16/2018 1055   CREATININE 0.64 10/12/2019 0703   CREATININE 0.64 (L) 08/29/2015 1004   CALCIUM 8.5 (L) 10/12/2019 0703   PROT 6.4 (L) 10/05/2019 1248   PROT 6.3 01/08/2018 1527   ALBUMIN 3.6 10/05/2019 1248   ALBUMIN 4.0 01/08/2018 1527   AST 23 10/05/2019 1248   ALT 21 10/05/2019 1248   ALKPHOS 76 10/05/2019 1248   BILITOT 0.6  10/05/2019 1248   BILITOT 0.6 01/08/2018 1527   GFRNONAA >60 10/12/2019 0703   GFRAA >60 10/12/2019 0703   Lipase     Component Value Date/Time   LIPASE 34 06/15/2018 0759       Studies/Results: No results found.  Anti-infectives: Anti-infectives (From admission, onward)   Start     Dose/Rate Route Frequency Ordered Stop   10/10/19 0846  sodium chloride 0.9 % with cefoTEtan (CEFOTAN) ADS Med    Note to Pharmacy: Henrine Screws   : cabinet override      10/10/19 0846 10/10/19 1035   10/10/19 0845  cefoTEtan (CEFOTAN) 2 g in sodium chloride 0.9 % 100 mL IVPB     2 g 200 mL/hr over 30 Minutes Intravenous  Once 10/10/19 0842 10/10/19 2307   10/09/19 1400  neomycin (MYCIFRADIN) tablet 1,000 mg     1,000 mg Oral 3 times per day 10/09/19 0943 10/09/19 2138   10/09/19 1400  metroNIDAZOLE (FLAGYL) tablet 1,000 mg     1,000 mg Oral 3 times per day 10/09/19 0943 10/09/19 2137   10/09/19 1030  cefoTEtan (CEFOTAN) 2 g in sodium chloride 0.9 % 100 mL IVPB  Status:  Discontinued     2 g 200 mL/hr over 30 Minutes Intravenous On call to O.R. 10/09/19 2025 10/10/19 0559  Assessment/Plan CHF, EF 30-35% HTN HLD OSA A fib, on eliquis - hgb stable, okay to restart at d/c   Ascending colon mass S/p laparoscopic right colectomy with ileocolic anastomosis - Dr. 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 Ki-67 is approximately 1% consistent with a low grade per report - Colonoscopy Path with Tubular adenoma with high grade dysplasia for descending colon poylpectomy. Stalk margin is negative for dysplasia.  - CEA 3.9 - CT A/P showed colonic mass of the cecum/ascending colon with Right ileocolic mesenteric lymphadenopathy compatible with metastatic disease - Surgical Path Pending  - Med Onc following (consult inpatient vs outpatient follow up) - Mobilize, Pulm Toilet  - Patient is tolerating a diet without  n/v. Pain well controlled on oral medications. Voiding without difficulty. Mobilizing (PT rec no f/u). Okay for discharge from our standpoint with follow up in the office and with Oncology.  FEN -Soft  VTE - SCDs, Lovenox  ID -Cefotetan peri-op. None currently needed Foley - Removed Follow-Up - Dr. Kieth Brightly, Oncology    LOS: 6 days    [name] , [facility] 10/12/2019, 9:58 AM Please see Amion for pager number during day hours 7:00am-4:30pm

## 2019-10-12 NOTE — Progress Notes (Signed)
OT Cancellation Note  Patient Details Name: [name] MRN: [id number] DOB: March 05, 1951   Cancelled Treatment:    Reason Eval/Treat Not Completed: OT screened, no needs identified, will sign off  Malka So 10/12/2019, 7:50 AM  Nestor Lewandowsky, OTR/L Acute Rehabilitation Services Pager: [phone] Office: [phone]

## 2019-10-12 NOTE — Progress Notes (Signed)
Progress Note  Patient Name: [name] Date of Encounter: 10/12/2019  Primary Cardiologist: Larae Grooms, MD   Subjective   Pathology showed  tubular adenoma with high grade dysplasia. Surgery yesterday went well with no complications. Patient had some nasuea and vomiting x 1 after drinking coffee but feeling better. No chest pain. Rates are still controlled.   Inpatient Medications    Scheduled Meds: . acetaminophen  1,000 mg Oral Q6H  . amLODipine  5 mg Oral q AM  . atorvastatin  40 mg Oral QHS  . carbamide peroxide  5 drop Left EAR BID  . cetirizine  10 mg Oral Daily  . clotrimazole   Topical BID  . enoxaparin (LOVENOX) injection  40 mg Subcutaneous Q24H  . feeding supplement  237 mL Oral BID BM  . fluticasone  2 spray Each Nare Daily  . gabapentin  300 mg Oral BID  . guaiFENesin  600 mg Oral BID  . melatonin  3 mg Oral QHS  . metoprolol tartrate  100 mg Oral BID  . saccharomyces boulardii  250 mg Oral BID  . sacubitril-valsartan  1 tablet Oral BID  . traZODone  50 mg Oral QHS   Continuous Infusions:  PRN Meds: acetaminophen, alum & mag hydroxide-simeth, diphenhydrAMINE **OR** diphenhydrAMINE, metoprolol tartrate, morphine injection, ondansetron **OR** ondansetron (ZOFRAN) IV, oxyCODONE, sodium chloride, traMADol   Vital Signs    Vitals:   10/11/19 2125 10/12/19 0443 10/12/19 1004 10/12/19 1224  BP: (!) 155/72 (!) 152/67 (!) 147/96 119/78  Pulse: (!) 51 (!) 46 98 (!) 57  Resp: 20   16  Temp: 98.4 F (36.9 C) 98.1 F (36.7 C)  98.2 F (36.8 C)  TempSrc: Oral Oral  Oral  SpO2: 95% 94%  97%  Weight:      Height:       No intake or output data in the 24 hours ending 10/12/19 1244 Last 3 Weights 10/11/2019 10/09/2019 10/05/2019  Weight (lbs) 218 lb 12.8 oz 216 lb 11.2 oz 212 lb  Weight (kg) 99.247 kg 98.294 kg 96.163 kg      Telemetry    N/A - Personally Reviewed  ECG    No new - Personally Reviewed  Physical Exam   Affect  appropriate Healthy:  appears stated age HEENT: normal Neck supple with no adenopathy JVP normal no bruits no thyromegaly Lungs clear with no wheezing and good diaphragmatic motion Heart:  S1/S2 no murmur, no rub, gallop or click PMI increased  Abdomen post colectomy BS positive has had BM  no bruit.  No HSM or HJR Distal pulses intact with no bruits No edema Neuro non-focal Skin warm and dry No muscular weakness   Labs    High Sensitivity Troponin:  No results for input(s): TROPONINIHS in the last 720 hours.    Chemistry Recent Labs  Lab 10/05/19 1248 10/06/19 0616 10/10/19 0739 10/11/19 0315 10/12/19 0703  NA 140   < > 139 139 136  K 4.1   < > 4.0 3.7 3.9  CL 106   < > 107 110 104  CO2 22   < > 24 22 23   GLUCOSE 104*   < > 108* 120* 116*  BUN 12   < > 7* 6* 7*  CREATININE 0.70   < > 0.82 0.67 0.64  CALCIUM 8.9   < > 8.5* 8.1* 8.5*  PROT 6.4*  --   --   --   --   ALBUMIN 3.6  --   --   --   --  AST 23  --   --   --   --   ALT 21  --   --   --   --   ALKPHOS 76  --   --   --   --   BILITOT 0.6  --   --   --   --   GFRNONAA >60   < > >60 >60 >60  GFRAA >60   < > >60 >60 >60  ANIONGAP 12   < > 8 7 9    < > = values in this interval not displayed.     Hematology Recent Labs  Lab 10/10/19 0739 10/11/19 0315 10/12/19 0703  WBC 6.0 9.0 10.0  RBC 3.77* 3.54* 3.80*  HGB 11.4* 10.5* 11.4*  HCT 33.6* 31.5* 34.5*  MCV 89.1 89.0 90.8  MCH 30.2 29.7 30.0  MCHC 33.9 33.3 33.0  RDW 14.1 14.0 14.2  PLT 154 167 194    BNPNo results for input(s): BNP, PROBNP in the last 168 hours.   DDimer No results for input(s): DDIMER in the last 168 hours.   Radiology    No results found.  Cardiac Studies   Echo 01/2019  1. The left ventricle has moderate-severely reduced systolic function,  with an ejection fraction of 30-35%. The cavity size was severely dilated.  Left ventricular diastolic function could not be evaluated secondary to  atrial fibrillation. Left   ventricular diffuse hypokinesis.   2. The right ventricle has normal systolic function. The cavity was  normal. There is no increase in right ventricular wall thickness.   3. Left atrial size was severely dilated.   4. There is moderate mitral annular calcification present. Mitral valve  regurgitation is moderate by color flow Doppler. The MR jet is  posteriorly-directed.   5. The aortic valve is tricuspid. Mild sclerosis of the aortic valve.   6. The aorta is normal unless otherwise noted.   Patient Profile     69 y.o. male with pmh of Afib, HTN, HLD, CHF, OSA who came in with GIB. Cardiology consulted for pre-op evaluation.   Assessment & Plan    CHF/presumed nonischemic cardiomyopathy - EF 30-35% on echo 01/2019 - lopressor and entresto written for Should discuss with Dr Irish Lack d/c norvasc and increasing dose of Entresto as outpatient  - resume home lasix dose he indicated only taking 40 mg daily not bid  Weight is up no I/O's have written for today    Permanent Afib - rate controlled with BB - Eliquis held for GIB. Surgery indicates ok to resume eliquis 5 mg bid on d/c   GIB 2/2 colonic mass - post surgery taking PO has had BM Evidence of metastatic disease will need adjuvant chemo and f/u with oncology Hct improved today 34.5 Interestingly CEA was normal at 3.9   HTN - amlodipine 5mg  and lopressor - pressures stable  HLD - atorvastatin - LDL 45 in 2019   Resume lasix 40 mg daily  Resume eliquis 5 mg bid On d/c  I will arrange f/u with Dr Irish Lack Cardiology will sign off   Jenkins Rouge MD [facility]

## 2019-10-12 NOTE — Progress Notes (Signed)
Patient refusing to start PO lasix until tomorrow. MD Nishan aware.

## 2019-10-12 NOTE — Discharge Instructions (Addendum)
[facility], [place] [phone]  OPEN ABDOMINAL SURGERY: POST OP INSTRUCTIONS  Always review your discharge instruction sheet given to you by the facility where your surgery was performed.  IF YOU HAVE DISABILITY OR FAMILY LEAVE FORMS, YOU MUST BRING THEM TO THE OFFICE FOR PROCESSING.  PLEASE DO NOT GIVE THEM TO YOUR DOCTOR.  1. A prescription for pain medication may be given to you upon discharge.  Take your pain medication as prescribed, if needed.  If narcotic pain medicine is not needed, then you may take acetaminophen (Tylenol) or ibuprofen (Advil) as needed. 2. Take your usually prescribed medications unless otherwise directed. 3. If you need a refill on your pain medication, please contact your pharmacy. They will contact our office to request authorization.  Prescriptions will not be filled after 5pm or on week-ends. 4. You should follow a light diet the first few days after arrival home, such as soup and crackers, pudding, etc.unless your doctor has advised otherwise. A high-fiber, low fat diet can be resumed as tolerated.   Be sure to include lots of fluids daily. Most patients will experience some swelling and bruising on the chest and neck area.  Ice packs will help.  Swelling and bruising can take several days to resolve 5. Most patients will experience some swelling and bruising in the area of the incision. Ice pack will help. Swelling and bruising can take several days to resolve..  6. It is common to experience some constipation if taking pain medication after surgery.  Increasing fluid intake and taking a stool softener will usually help or prevent this problem from occurring.  A mild laxative (Milk of Magnesia or Miralax) should be taken according to package directions if there are no bowel movements after 48 hours. 7.  You may have steri-strips (small skin tapes) in place directly over the incision.  These strips should be left on the skin for 7-10 days.  If your  surgeon used skin glue on the incision, you may shower in 24 hours.  The glue will flake off over the next 2-3 weeks.  Any sutures or staples will be removed at the office during your follow-up visit. You may find that a light gauze bandage over your incision may keep your staples from being rubbed or pulled. You may shower and replace the bandage daily. 8. The honeycomb dressing can be removed on 5/14. The staples will stay in place until your follow up appointment.  9. ACTIVITIES:  You may resume regular (light) daily activities beginning the next day--such as daily self-care, walking, climbing stairs--gradually increasing activities as tolerated.  You may have sexual intercourse when it is comfortable.  Refrain from any heavy lifting or straining until approved by your doctor. a. You may drive when you no longer are taking prescription pain medication, you can comfortably wear a seatbelt, and you can safely maneuver your car and apply brakes b. Return to Work: ___________________________________ 76. You should see your doctor in the office for a follow-up appointment approximately two weeks after your surgery.  Make sure that you call for this appointment within a day or two after you arrive home to insure a convenient appointment time. OTHER INSTRUCTIONS:  _____________________________________________________________ _____________________________________________________________  WHEN TO CALL YOUR DOCTOR: 1. Fever over 101.0 2. Inability to urinate 3. Nausea and/or vomiting 4. Extreme swelling or bruising 5. Continued bleeding from incision. 6. Increased pain, redness, or drainage from the incision. 7. Difficulty swallowing or breathing 8. Muscle  cramping or spasms. 9. Numbness or tingling in hands or feet or around lips.  The clinic staff is available to answer your questions during regular business hours.  Please don't hesitate to call and ask to speak to one of the nurses if you have  concerns.  For further questions, please visit [email or web address]   -------------------------------------------------------------------------------------------------------------------------------------------------------------------------------------  We have not changed your home medications.    Please make sure you have follow up appointments with your cardiologist, general surgeon, and oncology.  The contact information is listed in this discharge packet.

## 2019-10-12 NOTE — Progress Notes (Signed)
Family Medicine Teaching Service Daily Progress Note Intern Pager: [phone]  Patient name: [name] Medical record number: [id number] Date of birth: 18-Aug-1950 Age: 68 y.o. Gender: male  Primary Care Provider: Patient, No Pcp Per Consultants: GI, surgery Code Status: full  Pt Overview and Major Events to Date:  5/5 admitted for GI bleed, hgb 11.4 5/7 colonoscopy revealing fungating mass 5/10 right colectomy with anastamosis  Assessment and Plan: [name] [facility] a 68 y.o.malepresenting with rectal bleeding, found to have fungating mass in colon concerning for malignancy. PMH is significant forA. Fib,HTN, HLD, CHF,OSA.  GI Bleed 2/2 endocrine tumor  s/p R colectomy w/ anastomosis POD #1 [name] is a 69 year old man presenting with GI bleed x1 week. Has not has eliquis (for A fib) since 4/29. Asymptomatic, hgb remains stable above 10.  GI was consulted and performed colonoscopy that revealed a large, fungating, ulcerated, polypoid mass in cecum, most likely a malignancy. 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 A polyp in the descending colonwas also removed during colonoscopy which proved to be a tubular adenoma with high grade dysplasia; however, the stalk had clean margins.  CEA WNL at 3.9. CT chest/abd/pelv showed mass discussed above as well as right ileocolic mesenteric lymphadenopathy compatible with metastic disease. Surgery has been consulted, appreciate their recommendations. Patient underwent successful resection yesterday, resulting in an anastomosis. He is doing well post-operatively, UOP is appropriate, pain controlled, tolerating soft diet, passing flatus and had several loose BMs. Surgery and cardiology ok for discharge, patient is hesitant to be discharged as he had discomfort eating spaghetti. Will hold overnight and discharge tomorrow.  -  GI consulted, appreciate recs. - Surgery consulted, appreciate recs - will need to consult oncology - Cardiac telemetry - Transfuse if hgb <8 - AM CBC, BMP - Soft diet per surgery - Restart eliquis - PT/OT   A Fib HR today 46. Patient has so far been rate controlled post-op. Patient has not yet received lopressor yet today, hold parameters added for HR <60, MAP <65. Previously converted in 2015 but NSR did not hold, is rate controlled with Lopressor 100 mg twice daily, takes Eliquis 5 mg twice daily. Last dose of Eliquis was 4/29 due to rectal bleeding.  CHA2DS2-VASc score of 4 (age, CHF, HTN, moderate non-obstructing atherosclerosis in LAD). For more cardiac information, see HFrEF section below.  - continue lopressor BID - restart eliquis  - cardiac monitoring  HFrEF, EF 30-35% Awaiting weight today. UOP appropriate on 5/10 at 1.02L, not charted 5/11. Non-ischemic cardiomyopathy, thought most likely to be due to tachycardia.Last Echo 10/2018 with LVEF 30-35%.NYHA class I, no SOB when able to afford and take [name] (currently he is able to afford it). Patient had heart cath in 2015 that showed moderate diffuse atherosclerosis in the LAD artery without significant stenosis, otherwise widely patent cardiac arteries.Homemedicationsinclude Entresto 24-26 mg twice daily, furosemide 40 mg twice daily, Lopressor 100 mg twice daily. Patient appears euvolemic. Cardiology consulted for optimization prior to surgery: acceptable risk for planned procedure without additional cardiac work up. Recommend strict I/Os, daily weights, avoid significant volume overload. If he develops an ileus after surgery, can use IV metoprolol or IV cardizem to help with ventricular rate control. - Continue home medications: lopressor, entresto - Restart lasix tomorrow per patient's request - Strict I/Os - Daily Weights - Avoid volume overload  Pulmonary nodules Few scattered pulmonary nodule in both lungs,  largest 22m.  Pt is a reported nonsmoker.   - 3 month CT chest follow up.   Thrombocytopenia- Improved Platelets WNL at 170 upon admission, reduced to 138 on 5/6, stable today at 194. Previously thrombocytopenia noted in January 2020 to 103. Will continue to monitor, recommend outpatient follow up.  -AM CBC  Groin rash Patient haspruriticscaling plaque along intertriginous zones of inguinal folds and gluteal cleft present for greater than 1 year. Most likely tinea cruris, will treat with antifungal cream.  Patient states that he feels great relief with the application of this antifungal cream, has not checked for progress since surgery yesterday. - Clotrimazole cream to affected areas   Sinus pressure, L ear fullness Patient reports seasonal allergies. Takes xyzal at home which he states relieves sinus pressure and feeling of fullness he regularly experiences in L ear. the hospital formulary does not carry xyzal, also called levocetirizine. However, we do have cetirizine, and will try this and flonase. Also debrox for ear wax. 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 No claudication or known PAD, remote h/o smoking, bilateral ABIs WNL in February, 2020. Stable. Onychomycosis present on toenails.  HTN Currently normotensive. Home medication includes amlodipine 5 mg daily. - Continue amlodipine  HLD Lipid panel from 2019 with LDL 45, total cholesterol 110.Home medication includes atorvastatin 40 mg daily. - continue atorvastatin  OSA Patient has OSA and has a CPAP at home but has not been using it and does not want to use it in hospital.  FEN/GI: NPO PPx: SCDs  Disposition: admitted to med-surg, disposition to home pending recovery from surgery, likely tomorrow  Subjective:  Patient feeling overall well today, comfortable, no pain. Tolerating soft diet well. Tolerated mashed potatoes and  soup yesterday. Passing flatus, one loose BM last night. No pain, only on tylenol. Recovering quite well so far.  Objective: Temp:  [97.8 F (36.6 C)-98.4 F (36.9 C)] 98.1 F (36.7 C) (05/12 0443) Pulse Rate:  [41-78] 46 (05/12 0443) Resp:  [16-20] 20 (05/11 2125) BP: (125-155)/(67-81) 152/67 (05/12 0443) SpO2:  [94 %-97 %] 94 % (05/12 0443) Physical Exam: General: Alert, oriented.  No acute distress. Cardiovascular: Irregularly irregular rhythm.  Normal rate.  No murmurs. Respiratory: Lungs clear to auscultation bilaterally. Abdomen: Soft, mildly tender at insiscion sites, ND.  Normal bowel sounds. Derm: honeycomb dressing clean, dry, intact on abdomen Extremities: No swelling.   Laboratory: Recent Labs  Lab 10/09/19 0559 10/10/19 0739 10/11/19 0315  WBC 6.4 6.0 9.0  HGB 11.8* 11.4* 10.5*  HCT 35.1* 33.6* 31.5*  PLT 148* 154 167   Recent Labs  Lab 10/05/19 1248 10/06/19 0616 10/09/19 0559 10/10/19 0739 10/11/19 0315  NA 140   < > 138 139 139  K 4.1   < > 4.1 4.0 3.7  CL 106   < > 105 107 110  CO2 22   < > _0 BUN 12   < > 7* 7* 6*  CREATININE 0.70   < > 0.76 0.82 0.67  CALCIUM 8.9   < > 8.6* 8.5* 8.1*  PROT 6.4*  --   --   --   --   BILITOT 0.6  --   --   --   --   ALKPHOS 76  --   --   --   --   ALT 21  --   --   --   --  AST 23  --   --   --   --   GLUCOSE 104*   < > 108* 108* 120*   < > = values in this interval not displayed.   CEA 4.9 Cecum/ascending colon pathology: well -differentiated neuroendocrine tumor; Ki-67 will be ordered and reported in pathology addendum Descending colon pathology: tubular adenoma with high grade dysplasia; stalk margin is negative for dysplasia  Imaging/Diagnostic Tests: 1. Irregular annular colonic mass in the cecum/ascending colon centered on the ileocecal valve region, compatible with primary colonic malignancy. No bowel obstruction. 2. Right ileocolic mesenteric lymphadenopathy compatible with metastatic  disease. No additional findings highly suspicious for metastatic disease. 3. Few scattered tiny pulmonary nodules in both lungs, largest 3 mm. Recommend attention on follow-up chest CT in 3 months. 4. Coronary atherosclerosis. 5. Aortic Atherosclerosis  Gladys Damme, MD 10/12/2019, 6:47 AM PGY-1, [place] Intern pager: [phone], text pages welcome

## 2019-10-13 ENCOUNTER — Other Ambulatory Visit: Payer: Self-pay | Admitting: Oncology

## 2019-10-13 DIAGNOSIS — Z9049 Acquired absence of other specified parts of digestive tract: Secondary | ICD-10-CM

## 2019-10-13 DIAGNOSIS — C7A8 Other malignant neuroendocrine tumors: Secondary | ICD-10-CM

## 2019-10-13 LAB — CBC
HCT: 34.5 % — ABNORMAL LOW (ref 39.0–52.0)
Hemoglobin: 11.3 g/dL — ABNORMAL LOW (ref 13.0–17.0)
MCH: 29.3 pg (ref 26.0–34.0)
MCHC: 32.8 g/dL (ref 30.0–36.0)
MCV: 89.4 fL (ref 80.0–100.0)
Platelets: 184 10*3/uL (ref 150–400)
RBC: 3.86 MIL/uL — ABNORMAL LOW (ref 4.22–5.81)
RDW: 14 % (ref 11.5–15.5)
WBC: 7.9 10*3/uL (ref 4.0–10.5)
nRBC: 0 % (ref 0.0–0.2)

## 2019-10-13 LAB — BASIC METABOLIC PANEL
Anion gap: 10 (ref 5–15)
BUN: 6 mg/dL — ABNORMAL LOW (ref 8–23)
CO2: 22 mmol/L (ref 22–32)
Calcium: 8.5 mg/dL — ABNORMAL LOW (ref 8.9–10.3)
Chloride: 105 mmol/L (ref 98–111)
Creatinine, Ser: 0.63 mg/dL (ref 0.61–1.24)
GFR calc Af Amer: >60 mL/min (ref >60)
GFR calc non Af Amer: >60 mL/min (ref >60)
Glucose, Bld: 131 mg/dL — ABNORMAL HIGH (ref 70–99)
Potassium: 3.9 mmol/L (ref 3.5–5.1)
Sodium: 137 mmol/L (ref 135–145)

## 2019-10-13 MED ORDER — APIXABAN 5 MG PO TABS
5.0000 mg | ORAL_TABLET | Freq: Two times a day (BID) | ORAL | Status: DC
  Administered 2019-10-13: 5 mg via ORAL
  Filled 2019-10-13: qty 1

## 2019-10-13 NOTE — Progress Notes (Addendum)
[name] to be D/C'd Home per MD order.  Discussed with the patient and all questions fully answered.   VSS, Skin clean, dry and intact without evidence of skin break down, no evidence of skin tears noted. IV catheter discontinued intact. Site without signs and symptoms of complications. Dressing and pressure applied.   An After Visit Summary was printed and given to the patient.    D/C education completed with patient/family including follow up instructions, medication list, d/c activities limitations if indicated, with other d/c instructions as indicated by MD - patient able to verbalize understanding, all questions fully answered.    Patient instructed to return to ED, call 911, or call MD for any changes in condition.    Patient escorted via [place], and D/C home via private car.

## 2019-10-13 NOTE — Progress Notes (Signed)
3 Days Post-Op  Subjective: No complaints today.  Only taking tylenol.  Moving his bowels well.  Doesn't like hospital food so not really eating it. No nausea.  Denies distention  ROS: See above, otherwise other systems negative  Objective: Vital signs in last 24 hours: Temp:  [98.2 F (36.8 C)-98.3 F (36.8 C)] 98.3 F (36.8 C) (05/12 1849) Pulse Rate:  [57-81] 81 (05/12 1849) Resp:  [16] 16 (05/12 1849) BP: (119-144)/(78-87) 144/87 (05/12 1849) SpO2:  [97 %] 97 % (05/12 1849) Last BM Date: 10/12/19  Intake/Output from previous day: No intake/output data recorded. Intake/Output this shift: No intake/output data recorded.  PE: Abd: soft, appropriately tender, +BS, ND, incisions c/d/i with staples and honeycomb dressing in place  Lab Results:  Recent Labs    10/12/19 0703 10/13/19 0301  WBC 10.0 7.9  HGB 11.4* 11.3*  HCT 34.5* 34.5*  PLT 194 184   BMET Recent Labs    10/12/19 0703 10/13/19 0301  NA 136 137  K 3.9 3.9  CL 104 105  CO2 23 22  GLUCOSE 116* 131*  BUN 7* 6*  CREATININE 0.64 0.63  CALCIUM 8.5* 8.5*   PT/INR No results for input(s): LABPROT, INR in the last 72 hours. CMP     Component Value Date/Time   NA 137 10/13/2019 0301   NA 142 07/16/2018 1055   K 3.9 10/13/2019 0301   CL 105 10/13/2019 0301   CO2 22 10/13/2019 0301   GLUCOSE 131 (H) 10/13/2019 0301   BUN 6 (L) 10/13/2019 0301   BUN 14 07/16/2018 1055   CREATININE 0.63 10/13/2019 0301   CREATININE 0.64 (L) 08/29/2015 1004   CALCIUM 8.5 (L) 10/13/2019 0301   PROT 6.4 (L) 10/05/2019 1248   PROT 6.3 01/08/2018 1527   ALBUMIN 3.6 10/05/2019 1248   ALBUMIN 4.0 01/08/2018 1527   AST 23 10/05/2019 1248   ALT 21 10/05/2019 1248   ALKPHOS 76 10/05/2019 1248   BILITOT 0.6 10/05/2019 1248   BILITOT 0.6 01/08/2018 1527   GFRNONAA >60 10/13/2019 0301   GFRAA >60 10/13/2019 0301   Lipase     Component Value Date/Time   LIPASE 34 06/15/2018 0759       Studies/Results: No  results found.  Anti-infectives: Anti-infectives (From admission, onward)   Start     Dose/Rate Route Frequency Ordered Stop   10/10/19 0846  sodium chloride 0.9 % with cefoTEtan (CEFOTAN) ADS Med    Note to Pharmacy: Henrine Screws   : cabinet override      10/10/19 0846 10/10/19 1035   10/10/19 0845  cefoTEtan (CEFOTAN) 2 g in sodium chloride 0.9 % 100 mL IVPB     2 g 200 mL/hr over 30 Minutes Intravenous  Once 10/10/19 0842 10/10/19 2307   10/09/19 1400  neomycin (MYCIFRADIN) tablet 1,000 mg     1,000 mg Oral 3 times per day 10/09/19 0943 10/09/19 2138   10/09/19 1400  metroNIDAZOLE (FLAGYL) tablet 1,000 mg     1,000 mg Oral 3 times per day 10/09/19 0943 10/09/19 2137   10/09/19 1030  cefoTEtan (CEFOTAN) 2 g in sodium chloride 0.9 % 100 mL IVPB  Status:  Discontinued     2 g 200 mL/hr over 30 Minutes Intravenous On call to O.R. 10/09/19 4270 10/10/19 0559       Assessment/Plan CHF, EF 30-35% HTN HLD OSA A fib, on eliquis - hgb stable, okay to restart at d/c   Ascending colon mass S/plaparoscopic right colectomy  with ileocolic anastomosis- Dr. Kieth Brightly - 10/10/2019 - POD #3 - Colonoscopy Path with well differented neuroendocrine tumor of the ascending colon/cecum.Ki-67 is approximately 1% consistent with a low gradeper report - Colonoscopy Path with Tubular adenoma with high grade dysplasiafor descending colon poylpectomy. Stalk margin is negative for dysplasia.  - Mathis 3.9 - CT A/P showed colonic mass of the cecum/ascending colon withRight ileocolic mesenteric lymphadenopathy compatible with metastatic disease - Surgical Path Pending  - Med Onc following (consult inpatient vs outpatient follow up) - Mobilize, Pulm Toilet - Patient is without n/v. Pain well controlled on oral medications. Voiding without difficulty. Mobilizing (PT rec no f/u). Okay for discharge from our standpoint with follow up in the office and with Oncology.  FEN -Soft  VTE -SCDs, Lovenox ID  -Cefotetan peri-op. None currently needed Foley - Removed Follow-Up- Dr. Kieth Brightly, Oncology    LOS: 7 days    [name] , [facility] Surgery 10/13/2019, 10:06 AM Please see Amion for pager number during day hours 7:00am-4:30pm or 7:00am -11:30am on weekends

## 2019-10-13 NOTE — Plan of Care (Signed)
Patient understanding of Discharge instructions

## 2019-10-13 NOTE — Progress Notes (Signed)
Brief oncology note:  Dr. Benay Spice stopped by to see this patient earlier this morning.  He lives in [place], [place].  He requests outpatient follow-up at [facility].  I have placed an outpatient referral to [place] to be seen by Dr. Burney Gauze after discharge.  I have notified the nurse navigator at our [facility] office about this referral and the patient will be contacted with the date and time of his appointment.  Mikey Bussing, DNP, AGPCNP-BC, AOCNP Mon/Tues/Thurs/Fri 7am-5pm; Off Wednesdays Cell: [phone]

## 2019-10-13 NOTE — Progress Notes (Addendum)
NT reported to writer that when attempted to get AM vitals pt stated " why didn't they tell you that I don't want to be disturbed plus Im going home tomorrow, I don't need my vitals taken". NT reported that she apologized and left the room.  NT notified writer that pt once again refused vitals.

## 2019-10-14 ENCOUNTER — Encounter: Payer: Self-pay | Admitting: *Deleted

## 2019-10-14 LAB — SURGICAL PATHOLOGY

## 2019-10-14 NOTE — Progress Notes (Signed)
Reached out to [name] to introduce myself as the office RN Navigator and explain our new patient process. Reviewed the reason for their referral and scheduled their new patient appointment along with labs. Provided address and directions to the office including call back phone number. Reviewed with patient any concerns they may have or any possible barriers to attending their appointment.   Informed patient about my role as a navigator and that I will meet with them prior to their New Patient appointment and more fully discuss what services I can provide. At this time patient has no further questions or needs.

## 2019-10-15 NOTE — Progress Notes (Signed)
Has appt with oncology

## 2019-10-17 ENCOUNTER — Inpatient Hospital Stay: Payer: Medicare Other | Admitting: Family Medicine

## 2019-10-17 NOTE — Progress Notes (Deleted)
    SUBJECTIVE:   CHIEF COMPLAINT / HPI:   Establish care/hospital follow-up: [name] is a very pleasant 68 year old male the presents today for hospital follow-up.  He was discharged from the hospital on 5/13 after being admitted for rectal bleeding, found to have colonic mass concerning for primary malignancy.  PERTINENT  PMH / PSH: Colonic mass status post right colectomy  OBJECTIVE:   There were no vitals taken for this visit.   General: NAD, pleasant, able to participate in exam Cardiac: RRR, no murmurs. Respiratory: CTAB, normal effort, No wheezes, rales or rhonchi Abdomen: Bowel sounds present, nontender, nondistended, no hepatosplenomegaly. Extremities: no edema or cyanosis. Skin: warm and dry, no rashes noted Neuro: alert, no obvious focal deficits Psych: Normal affect and mood  ASSESSMENT/PLAN:   No problem-specific Assessment & Plan notes found for this encounter. -Scheduled appointment to establish care   Lurline Del, [place]

## 2019-10-20 ENCOUNTER — Encounter: Payer: Self-pay | Admitting: *Deleted

## 2019-10-20 ENCOUNTER — Encounter: Payer: Self-pay | Admitting: Interventional Cardiology

## 2019-10-20 ENCOUNTER — Other Ambulatory Visit: Payer: Self-pay

## 2019-10-20 ENCOUNTER — Other Ambulatory Visit: Payer: Medicare Other

## 2019-10-20 ENCOUNTER — Ambulatory Visit: Payer: Medicare Other | Admitting: Interventional Cardiology

## 2019-10-20 VITALS — BP 100/62 | HR 72 | Ht 71.5 in | Wt 216.8 lb

## 2019-10-20 DIAGNOSIS — I4821 Permanent atrial fibrillation: Secondary | ICD-10-CM

## 2019-10-20 DIAGNOSIS — I5022 Chronic systolic (congestive) heart failure: Secondary | ICD-10-CM | POA: Diagnosis not present

## 2019-10-20 DIAGNOSIS — I428 Other cardiomyopathies: Secondary | ICD-10-CM | POA: Diagnosis not present

## 2019-10-20 DIAGNOSIS — Z5181 Encounter for therapeutic drug level monitoring: Secondary | ICD-10-CM

## 2019-10-20 DIAGNOSIS — Z7901 Long term (current) use of anticoagulants: Secondary | ICD-10-CM

## 2019-10-20 NOTE — Patient Instructions (Signed)
Medication Instructions:  Your physician recommends that you continue on your current medications as directed. Please refer to the Current Medication list given to you today.  *If you need a refill on your cardiac medications before your next appointment, please call your pharmacy*   Lab Work: CBC and BMET (already ordered)  If you have labs (blood work) drawn today and your tests are completely normal, you will receive your results only by: Marland Kitchen MyChart Message (if you have MyChart) OR . A paper copy in the mail If you have any lab test that is abnormal or we need to change your treatment, we will call you to review the results.   Testing/Procedures: None ordered   Follow-Up: At [facility], you and your health needs are our priority.  As part of our continuing mission to provide you with exceptional heart care, we have created designated Provider Care Teams.  These Care Teams include your primary Cardiologist (physician) and Advanced Practice Providers (APPs -  Physician Assistants and Nurse Practitioners) who all work together to provide you with the care you need, when you need it.  We recommend signing up for the patient portal called "MyChart".  Sign up information is provided on this After Visit Summary.  MyChart is used to connect with patients for Virtual Visits (Telemedicine).  Patients are able to view lab/test results, encounter notes, upcoming appointments, etc.  Non-urgent messages can be sent to your provider as well.   To learn more about what you can do with MyChart, go to [email or web address].    Your next appointment:   6 month(s)  The format for your next appointment:   In Person  Provider:   You may see Larae Grooms, MD or one of the following Advanced Practice Providers on your designated Care Team:    Melina Copa, PA-C  Ermalinda Barrios, PA-C    Other Instructions None
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
## 2019-10-21 ENCOUNTER — Encounter: Payer: Self-pay | Admitting: *Deleted

## 2019-10-21 ENCOUNTER — Inpatient Hospital Stay (HOSPITAL_BASED_OUTPATIENT_CLINIC_OR_DEPARTMENT_OTHER): Payer: Medicare Other | Admitting: Hematology & Oncology

## 2019-10-21 ENCOUNTER — Encounter: Payer: Self-pay | Admitting: Hematology & Oncology

## 2019-10-21 ENCOUNTER — Inpatient Hospital Stay: Payer: Medicare Other | Attending: Hematology & Oncology

## 2019-10-21 VITALS — BP 124/73 | HR 73 | Temp 97.7°F | Resp 18 | Ht 71.0 in | Wt 217.8 lb

## 2019-10-21 DIAGNOSIS — I110 Hypertensive heart disease with heart failure: Secondary | ICD-10-CM | POA: Insufficient documentation

## 2019-10-21 DIAGNOSIS — Z79899 Other long term (current) drug therapy: Secondary | ICD-10-CM | POA: Diagnosis not present

## 2019-10-21 DIAGNOSIS — Z9049 Acquired absence of other specified parts of digestive tract: Secondary | ICD-10-CM

## 2019-10-21 DIAGNOSIS — I4891 Unspecified atrial fibrillation: Secondary | ICD-10-CM | POA: Insufficient documentation

## 2019-10-21 DIAGNOSIS — C7A8 Other malignant neuroendocrine tumors: Secondary | ICD-10-CM

## 2019-10-21 DIAGNOSIS — Z87891 Personal history of nicotine dependence: Secondary | ICD-10-CM | POA: Diagnosis not present

## 2019-10-21 DIAGNOSIS — I509 Heart failure, unspecified: Secondary | ICD-10-CM | POA: Diagnosis not present

## 2019-10-21 DIAGNOSIS — Z7901 Long term (current) use of anticoagulants: Secondary | ICD-10-CM | POA: Diagnosis not present

## 2019-10-21 DIAGNOSIS — R599 Enlarged lymph nodes, unspecified: Secondary | ICD-10-CM | POA: Insufficient documentation

## 2019-10-21 DIAGNOSIS — M1990 Unspecified osteoarthritis, unspecified site: Secondary | ICD-10-CM | POA: Insufficient documentation

## 2019-10-21 DIAGNOSIS — Z833 Family history of diabetes mellitus: Secondary | ICD-10-CM | POA: Insufficient documentation

## 2019-10-21 DIAGNOSIS — Z8711 Personal history of peptic ulcer disease: Secondary | ICD-10-CM

## 2019-10-21 DIAGNOSIS — Z809 Family history of malignant neoplasm, unspecified: Secondary | ICD-10-CM | POA: Insufficient documentation

## 2019-10-21 LAB — CMP (CANCER CENTER ONLY)
ALT: 14 U/L (ref 0–44)
AST: 13 U/L — ABNORMAL LOW (ref 15–41)
Albumin: 3.9 g/dL (ref 3.5–5.0)
Alkaline Phosphatase: 77 U/L (ref 38–126)
Anion gap: 8 (ref 5–15)
BUN: 16 mg/dL (ref 8–23)
CO2: 26 mmol/L (ref 22–32)
Calcium: 9.5 mg/dL (ref 8.9–10.3)
Chloride: 104 mmol/L (ref 98–111)
Creatinine: 0.71 mg/dL (ref 0.61–1.24)
GFR, Est AFR Am: >60 mL/min (ref >60)
GFR, Estimated: >60 mL/min (ref >60)
Glucose, Bld: 130 mg/dL — ABNORMAL HIGH (ref 70–99)
Potassium: 4.3 mmol/L (ref 3.5–5.1)
Sodium: 138 mmol/L (ref 135–145)
Total Bilirubin: 0.3 mg/dL (ref 0.3–1.2)
Total Protein: 6.5 g/dL (ref 6.5–8.1)

## 2019-10-21 LAB — CBC WITH DIFFERENTIAL (CANCER CENTER ONLY)
Abs Immature Granulocytes: 0.03 10*3/uL (ref 0.00–0.07)
Basophils Absolute: 0.1 10*3/uL (ref 0.0–0.1)
Basophils Relative: 1 %
Eosinophils Absolute: 0.5 10*3/uL (ref 0.0–0.5)
Eosinophils Relative: 6 %
HCT: 34 % — ABNORMAL LOW (ref 39.0–52.0)
Hemoglobin: 11.3 g/dL — ABNORMAL LOW (ref 13.0–17.0)
Immature Granulocytes: 0 %
Lymphocytes Relative: 21 %
Lymphs Abs: 1.9 10*3/uL (ref 0.7–4.0)
MCH: 29.2 pg (ref 26.0–34.0)
MCHC: 33.2 g/dL (ref 30.0–36.0)
MCV: 87.9 fL (ref 80.0–100.0)
Monocytes Absolute: 0.9 10*3/uL (ref 0.1–1.0)
Monocytes Relative: 10 %
Neutro Abs: 5.6 10*3/uL (ref 1.7–7.7)
Neutrophils Relative %: 62 %
Platelet Count: 259 10*3/uL (ref 150–400)
RBC: 3.87 MIL/uL — ABNORMAL LOW (ref 4.22–5.81)
RDW: 13.6 % (ref 11.5–15.5)
WBC Count: 8.9 10*3/uL (ref 4.0–10.5)
nRBC: 0 % (ref 0.0–0.2)

## 2019-10-21 LAB — BASIC METABOLIC PANEL
BUN/Creatinine Ratio: 17 (ref 10–24)
BUN: 13 mg/dL (ref 8–27)
CO2: 22 mmol/L (ref 20–29)
Calcium: 9.4 mg/dL (ref 8.6–10.2)
Chloride: 100 mmol/L (ref 96–106)
Creatinine, Ser: 0.77 mg/dL (ref 0.76–1.27)
GFR calc Af Amer: 109 mL/min/{1.73_m2} (ref >59)
GFR calc non Af Amer: 95 mL/min/{1.73_m2} (ref >59)
Glucose: 92 mg/dL (ref 65–99)
Potassium: 4.7 mmol/L (ref 3.5–5.2)
Sodium: 136 mmol/L (ref 134–144)

## 2019-10-21 LAB — CBC
Hematocrit: 33.5 % — ABNORMAL LOW (ref 37.5–51.0)
Hemoglobin: 11.6 g/dL — ABNORMAL LOW (ref 13.0–17.7)
MCH: 29.7 pg (ref 26.6–33.0)
MCHC: 34.6 g/dL (ref 31.5–35.7)
MCV: 86 fL (ref 79–97)
Platelets: 280 10*3/uL (ref 150–450)
RBC: 3.91 x10E6/uL — ABNORMAL LOW (ref 4.14–5.80)
RDW: 14.1 % (ref 11.6–15.4)
WBC: 10.5 10*3/uL (ref 3.4–10.8)

## 2019-10-21 NOTE — Progress Notes (Signed)
Initial RN Navigator Patient Visit  Name: [name] Date of Referral : 10/13/2019 Diagnosis: Neuroendocrine Cancer of GI Tract  Met with patient prior to their visit with MD. Hanley Seamen patient "Your Patient Navigator" handout which explains my role, areas in which I am able to help, and all the contact information for myself and the office. Also gave patient MD and Navigator business card. Reviewed with patient the general overview of expected course after initial diagnosis and time frame for all steps to be completed.  New patient packet given to patient which includes: orientation to office and staff; campus directory; education on My Chart and Advance Directives; and patient centered education on neuroendocrine cancer of the GI tract.   Patient completed visit with Dr. Marin Olp  No further needs at this time.  Patient understands all follow up procedures and expectations. They have my number to reach out for any further clarification or additional needs.
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
## 2019-10-24 ENCOUNTER — Telehealth: Payer: Self-pay

## 2019-10-24 ENCOUNTER — Encounter: Payer: Self-pay | Admitting: *Deleted

## 2019-10-24 LAB — IRON AND TIBC
Iron: 39 ug/dL — ABNORMAL LOW (ref 42–163)
Saturation Ratios: 11 % — ABNORMAL LOW (ref 20–55)
TIBC: 364 ug/dL (ref 202–409)
UIBC: 324 ug/dL (ref 117–376)

## 2019-10-24 LAB — CHROMOGRANIN A: Chromogranin A (ng/mL): 60.4 ng/mL (ref 0.0–101.8)

## 2019-10-24 LAB — LACTATE DEHYDROGENASE: LDH: 160 U/L (ref 98–192)

## 2019-10-24 LAB — FERRITIN: Ferritin: 30 ng/mL (ref 24–336)

## 2019-10-24 NOTE — Telephone Encounter (Signed)
Late entry:  Patient was given patient assistance forms for both Eliquis and Entresto when he was in the office for his appointment on 5/20. He stated that he had trouble receiving these forms from the companies in the past. I have asked the patient to complete his part on both forms and bring them back to [place] and we will fill out our part and fax to the appropriate parties. He verbalized understanding.

## 2019-10-24 NOTE — Progress Notes (Signed)
Called and spoke with patient regarding scheduling of follow up appointment. Patient aware of time and date. He is also aware that we will plan to administer his first treatment at that appointment.

## 2019-10-26 ENCOUNTER — Telehealth: Payer: Self-pay | Admitting: Interventional Cardiology

## 2019-10-26 NOTE — Telephone Encounter (Signed)
° °  Pt c/o medication issue:  1. Name of Medication:   ENTRESTO 24-26 MG    2. How are you currently taking this medication (dosage and times per day)?   3. Are you having a reaction (difficulty breathing--STAT)?   4. What is your medication issue? Pt met with his oncology Dr. Marin Olp, he said he asked him why Dr. Irish Lack puts him on entresto. He just concern because his oncology questioned it. He would like to speak with [place]

## 2019-10-26 NOTE — Telephone Encounter (Signed)
Attempted to contact patient but there was no answer and VM was full. ?

## 2019-11-01 NOTE — Telephone Encounter (Signed)
Attempted to contact patient again but there was no answer and VM is full.

## 2019-11-08 ENCOUNTER — Ambulatory Visit: Payer: Medicare Other | Admitting: Family Medicine

## 2019-11-09 NOTE — Telephone Encounter (Signed)
Attempted to contact this patient again but there was no answer and VM still full.

## 2019-11-15 ENCOUNTER — Encounter: Payer: Self-pay | Admitting: *Deleted

## 2019-11-15 NOTE — Progress Notes (Signed)
Patient calling with complaints of infection to his incision. The call is difficult to understand as his phone is "on it's last legs" and the connection is poor. It sounds as though the patient started experiencing pain at the incision a few days ago and now today is red and possibly infected. He wants to know what to do next.  I instructed the patient to call his surgeon's office and I provided him with the number. Suggested that it needed to be assessed in the next day or two and to try to get an appointment. He will call me back once he has spoken to that office and inform me of appointment time.  He has an appointment with [place] on Thursday to start somatuline. I informed him that we would make a decision regarding that appointment once he was seen and assessed by the surgeon's office. He understood.

## 2019-11-16 ENCOUNTER — Encounter: Payer: Self-pay | Admitting: *Deleted

## 2019-11-16 ENCOUNTER — Other Ambulatory Visit: Payer: Self-pay | Admitting: *Deleted

## 2019-11-16 DIAGNOSIS — C7A8 Other malignant neuroendocrine tumors: Secondary | ICD-10-CM

## 2019-11-16 NOTE — Progress Notes (Signed)
Attempted to call patient to follow up on yesterdays concern with no answer, and no ability to leave message. Called and spoke to patient's wife, Horris Latino.  Patient was seen by the surgeons office yesterday. They started the patient on antibiotics and marked the area of infection. Wife states the area already looks better today.   We will plan to see the patient in this office tomorrow as previously scheduled. Wife appreciative of follow up call.

## 2019-11-17 ENCOUNTER — Other Ambulatory Visit: Payer: Self-pay

## 2019-11-17 ENCOUNTER — Encounter: Payer: Self-pay | Admitting: Hematology & Oncology

## 2019-11-17 ENCOUNTER — Encounter: Payer: Self-pay | Admitting: *Deleted

## 2019-11-17 ENCOUNTER — Inpatient Hospital Stay (HOSPITAL_BASED_OUTPATIENT_CLINIC_OR_DEPARTMENT_OTHER): Payer: Medicare Other | Admitting: Hematology & Oncology

## 2019-11-17 ENCOUNTER — Inpatient Hospital Stay: Payer: Medicare Other | Attending: Hematology & Oncology

## 2019-11-17 ENCOUNTER — Inpatient Hospital Stay: Payer: Medicare Other

## 2019-11-17 VITALS — BP 121/70 | HR 70 | Temp 96.9°F | Resp 18 | Wt 229.0 lb

## 2019-11-17 DIAGNOSIS — Z7901 Long term (current) use of anticoagulants: Secondary | ICD-10-CM | POA: Insufficient documentation

## 2019-11-17 DIAGNOSIS — L538 Other specified erythematous conditions: Secondary | ICD-10-CM | POA: Diagnosis not present

## 2019-11-17 DIAGNOSIS — C7A021 Malignant carcinoid tumor of the cecum: Secondary | ICD-10-CM | POA: Insufficient documentation

## 2019-11-17 DIAGNOSIS — C7B01 Secondary carcinoid tumors of distant lymph nodes: Secondary | ICD-10-CM | POA: Insufficient documentation

## 2019-11-17 DIAGNOSIS — Z79899 Other long term (current) drug therapy: Secondary | ICD-10-CM | POA: Insufficient documentation

## 2019-11-17 DIAGNOSIS — C7A8 Other malignant neuroendocrine tumors: Secondary | ICD-10-CM

## 2019-11-17 LAB — CBC WITH DIFFERENTIAL (CANCER CENTER ONLY)
Abs Immature Granulocytes: 0.02 10*3/uL (ref 0.00–0.07)
Basophils Absolute: 0 10*3/uL (ref 0.0–0.1)
Basophils Relative: 1 %
Eosinophils Absolute: 0.4 10*3/uL (ref 0.0–0.5)
Eosinophils Relative: 6 %
HCT: 36 % — ABNORMAL LOW (ref 39.0–52.0)
Hemoglobin: 11.4 g/dL — ABNORMAL LOW (ref 13.0–17.0)
Immature Granulocytes: 0 %
Lymphocytes Relative: 23 %
Lymphs Abs: 1.5 10*3/uL (ref 0.7–4.0)
MCH: 27.1 pg (ref 26.0–34.0)
MCHC: 31.7 g/dL (ref 30.0–36.0)
MCV: 85.7 fL (ref 80.0–100.0)
Monocytes Absolute: 0.9 10*3/uL (ref 0.1–1.0)
Monocytes Relative: 14 %
Neutro Abs: 3.5 10*3/uL (ref 1.7–7.7)
Neutrophils Relative %: 56 %
Platelet Count: 151 10*3/uL (ref 150–400)
RBC: 4.2 MIL/uL — ABNORMAL LOW (ref 4.22–5.81)
RDW: 14 % (ref 11.5–15.5)
WBC Count: 6.3 10*3/uL (ref 4.0–10.5)
nRBC: 0 % (ref 0.0–0.2)

## 2019-11-17 LAB — CMP (CANCER CENTER ONLY)
ALT: 14 U/L (ref 0–44)
AST: 15 U/L (ref 15–41)
Albumin: 4 g/dL (ref 3.5–5.0)
Alkaline Phosphatase: 108 U/L (ref 38–126)
Anion gap: 8 (ref 5–15)
BUN: 16 mg/dL (ref 8–23)
CO2: 25 mmol/L (ref 22–32)
Calcium: 9.8 mg/dL (ref 8.9–10.3)
Chloride: 106 mmol/L (ref 98–111)
Creatinine: 0.66 mg/dL (ref 0.61–1.24)
GFR, Est AFR Am: >60 mL/min (ref >60)
GFR, Estimated: >60 mL/min (ref >60)
Glucose, Bld: 106 mg/dL — ABNORMAL HIGH (ref 70–99)
Potassium: 4.3 mmol/L (ref 3.5–5.1)
Sodium: 139 mmol/L (ref 135–145)
Total Bilirubin: 0.3 mg/dL (ref 0.3–1.2)
Total Protein: 6.7 g/dL (ref 6.5–8.1)

## 2019-11-17 LAB — LACTATE DEHYDROGENASE: LDH: 165 U/L (ref 98–192)

## 2019-11-17 MED ORDER — LANREOTIDE ACETATE 120 MG/0.5ML ~~LOC~~ SOLN
120.0000 mg | Freq: Once | SUBCUTANEOUS | Status: AC
  Administered 2019-11-17: 120 mg via SUBCUTANEOUS

## 2019-11-17 MED ORDER — MUPIROCIN 2 % EX OINT: TOPICAL_OINTMENT | CUTANEOUS | 0 refills | Status: AC

## 2019-11-17 NOTE — Patient Instructions (Signed)
Lanreotide injection What is this medicine? LANREOTIDE (lan REE oh tide) is used to reduce blood levels of growth hormone in patients with a condition called acromegaly. It also works to slow or stop tumor growth in patients with neuroendocrine tumors and treat carcinoid syndrome. This medicine may be used for other purposes; ask your health care provider or pharmacist if you have questions. COMMON BRAND NAME(S): Somatuline [place] What should I tell my health care provider before I take this medicine? They need to know if you have any of these conditions:  diabetes  gallbladder disease  heart disease  kidney disease  liver disease  thyroid disease  an unusual or allergic reaction to lanreotide, other medicines, foods, dyes, or preservatives  pregnant or trying to get pregnant  breast-feeding How should I use this medicine? This medicine is for injection under the skin. It is given by a health care professional in a hospital or clinic setting. Contact your pediatrician or health care professional regarding the use of this medicine in children. Special care may be needed. Overdosage: If you think you have taken too much of this medicine contact a poison control center or emergency room at once. NOTE: This medicine is only for you. Do not share this medicine with others. What if I miss a dose? It is important not to miss your dose. Call your doctor or health care professional if you are unable to keep an appointment. What may interact with this medicine? This medicine may interact with the following medications:  bromocriptine  cyclosporine  certain medicines for blood pressure, heart disease, irregular heart beat  certain medicines for diabetes  quinidine  terfenadine This list may not describe all possible interactions. Give your health care provider a list of all the medicines, herbs, non-prescription drugs, or dietary supplements you use. Also tell them if you smoke,  drink alcohol, or use illegal drugs. Some items may interact with your medicine. What should I watch for while using this medicine? Tell your doctor or healthcare professional if your symptoms do not start to get better or if they get worse. Visit your doctor or health care professional for regular checks on your progress. Your condition will be monitored carefully while you are receiving this medicine. This medicine may increase blood sugar. Ask your healthcare provider if changes in diet or medicines are needed if you have diabetes. You may need blood work done while you are taking this medicine. Women should inform their doctor if they wish to become pregnant or think they might be pregnant. There is a potential for serious side effects to an unborn child. Talk to your health care professional or pharmacist for more information. Do not breast-feed an infant while taking this medicine or for 6 months after stopping it. This medicine has caused ovarian failure in some women. This medicine may interfere with the ability to have a child. Talk with your doctor or health care professional if you are concerned about your fertility. What side effects may I notice from receiving this medicine? Side effects that you should report to your doctor or health care professional as soon as possible:  allergic reactions like skin rash, itching or hives, swelling of the face, lips, or tongue  increased blood pressure  severe stomach pain  signs and symptoms of hgh blood sugar such as being more thirsty or hungry or having to urinate more than normal. You may also feel very tired or have blurry vision.  signs and symptoms of low blood   sugar such as feeling anxious; confusion; dizziness; increased hunger; unusually weak or tired; sweating; shakiness; cold; irritable; headache; blurred vision; fast heartbeat; loss of consciousness  unusually slow heartbeat Side effects that usually do not require medical  attention (report to your doctor or health care professional if they continue or are bothersome):  constipation  diarrhea  dizziness  headache  muscle pain  muscle spasms  nausea  pain, redness, or irritation at site where injected This list may not describe all possible side effects. Call your doctor for medical advice about side effects. You may report side effects to FDA at 1-800-FDA-1088. Where should I keep my medicine? This drug is given in a hospital or clinic and will not be stored at home. NOTE: This sheet is a summary. It may not cover all possible information. If you have questions about this medicine, talk to your doctor, pharmacist, or health care provider.  2020 Elsevier/Gold Standard (2018-02-25 09:13:08)  

## 2019-11-17 NOTE — Telephone Encounter (Signed)
Multiple attempts made to contact patient. Will send letter.

## 2019-11-17 NOTE — Progress Notes (Signed)
Visited with patient prior to his follow up appointment. His abdominal wound infection is better today as far as redness and size goes, but he states he's experiencing more pain. He is very worried about his injection today and how he will receive it, and if it will hurt. Reviewed the injection site with patient - far away from his abdominal infection, and explained that while the needle itself may hurt, the injection itself doesn't usually cause significant pain. He states he doesn't want to see the needle and wants to nurse to just give it. I notified the infusion RNs of his wishes.   Patient is anxious, but ready to start his treatment. He knows to reach out to the office with any concerns.

## 2019-11-18 LAB — FERRITIN: Ferritin: 36 ng/mL (ref 24–336)

## 2019-11-18 LAB — IRON AND TIBC
Iron: 33 ug/dL — ABNORMAL LOW (ref 42–163)
Saturation Ratios: 8 % — ABNORMAL LOW (ref 20–55)
TIBC: 394 ug/dL (ref 202–409)
UIBC: 361 ug/dL (ref 117–376)

## 2019-11-18 NOTE — Progress Notes (Signed)
Hematology and Oncology Follow Up Visit  [name] [id number] 30-Aug-1950 69 y.o. 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  This is a second office visit.  We first saw him back in May.  He has a locally advanced carcinoid tumor of the cecum.  He had 5/15+ lymph nodes.  There was lymphovascular space invasion.  I felt that this was reasonable for "adjuvant" therapy.  I think that this would be reasonable for Somatuline since this is a carcinoid malignancy.  He is doing okay.  He really has no specific complaints.  There is no diarrhea.  He has had no abdominal pain.  There is some infection trying to happen at the incision site with his laparotomy.  It is somewhat erythematous.  It is quite indurated.  He is going go see the surgeon tomorrow.  I will go ahead and give him some Bactroban ointment to apply twice a day.  He has had no cough.  There has been no fever.  He has had no bleeding.  There is no leg swelling.  Overall, his performance status is ECOG 0.  Medications:  Current Outpatient Medications:  .  NON FORMULARY, Take 1 tablet by mouth at bedtime as needed (Sleep 3)., Disp: , Rfl:  .  Acetaminophen 500 MG capsule, Take 500-1,000 mg by mouth every 6 (six) hours as needed (headaches or body aches). , Disp: , Rfl:  .  amLODipine (NORVASC) 5 MG tablet, TAKE 1 TABLET BY MOUTH  DAILY, Disp: 90 tablet, Rfl: 3 .  atorvastatin (LIPITOR) 40 MG tablet, TAKE 1 TABLET BY MOUTH AT  BEDTIME, Disp: 90 tablet, Rfl: 3 .  Cholecalciferol (VITAMIN D PO), Take 1 tablet by mouth daily., Disp: , Rfl:  .  clotrimazole (LOTRIMIN) 1 % cream, Apply topically 2 (two) times daily., Disp: 60 g, Rfl: 0 .  ELIQUIS 5 MG TABS tablet, TAKE 1 TABLET BY MOUTH  TWICE DAILY, Disp: 180 tablet, Rfl: 1 .   ENTRESTO 24-26 MG, TAKE 1 TABLET BY MOUTH  TWICE DAILY, Disp: 180 tablet, Rfl: 3 .  furosemide (LASIX) 40 MG tablet, Take 1 tablet (40 mg total) by mouth daily., Disp: 30 tablet, Rfl:  .  levocetirizine (XYZAL) 5 MG tablet, Take 5 mg by mouth at bedtime as needed for allergies (or sinus issues)., Disp: , Rfl:  .  metoprolol tartrate (LOPRESSOR) 100 MG tablet, TAKE 1 TABLET BY MOUTH  TWICE DAILY, Disp: 180 tablet, Rfl: 3 .  Multiple Vitamins-Minerals (CENTRUM SILVER 50+MEN PO), Take 1 tablet by mouth daily., Disp: , Rfl:  .  mupirocin ointment (BACTROBAN) 2 %, Apply twice a day to abdominal wound, Disp: 22 g, Rfl: 0 .  OVER THE COUNTER MEDICATION, Take 1-2 tablets by mouth See admin instructions. Unnamed allergy/sinus tablet: Take 1-2 tablets by mouth once a day for allergies, rhinitis, or stopped up ears, Disp: , Rfl:  .  oxyCODONE (OXY IR/ROXICODONE) 5 MG immediate release tablet, Take 1 tablet (5 mg total) by mouth every 6 (six) hours as needed for breakthrough pain., Disp: 15 tablet, Rfl: 0  Allergies: No Known Allergies  Past Medical History, Surgical history, Social history, and Family History were reviewed and updated.  Review of Systems: Review of Systems  Constitutional: Negative.  HENT:  Negative.   Eyes: Negative.   Respiratory: Negative.   Cardiovascular: Negative.   Gastrointestinal: Negative.   Endocrine: Negative.   Genitourinary: Negative.    Musculoskeletal: Negative.   Skin: Positive for wound.  Neurological: Negative.   Hematological: Negative.   Psychiatric/Behavioral: Negative.     Physical Exam:  weight is 229 lb (103.9 kg). His temporal temperature is 96.9 F (36.1 C) (abnormal). His blood pressure is 121/70 and his pulse is 70. His respiration is 18 and oxygen saturation is 98%.   Wt Readings from Last 3 Encounters:  11/17/19 229 lb (103.9 kg)  10/21/19 217 lb 12.8 oz (98.8 kg)  10/20/19 216 lb 12.8 oz (98.3 kg)    Physical Exam Vitals reviewed.    HENT:     Head: Normocephalic and atraumatic.  Eyes:     Pupils: Pupils are equal, round, and reactive to light.  Cardiovascular:     Rate and Rhythm: Normal rate and regular rhythm.     Heart sounds: Normal heart sounds.  Pulmonary:     Effort: Pulmonary effort is normal.     Breath sounds: Normal breath sounds.  Abdominal:     General: Bowel sounds are normal.     Palpations: Abdomen is soft.     Comments: Abdominal exam shows the laparotomy scar in the midline.  He has an area of erythema in the laparotomy site.  This area of erythema induration probably measures 3 x 3 cm.  There is some tenderness.  There is no exudate that is being discharged.  Musculoskeletal:        General: No tenderness or deformity. Normal range of motion.     Cervical back: Normal range of motion.  Lymphadenopathy:     Cervical: No cervical adenopathy.  Skin:    General: Skin is warm and dry.     Findings: No erythema or rash.  Neurological:     Mental Status: He is alert and oriented to person, place, and time.  Psychiatric:        Behavior: Behavior normal.        Thought Content: Thought content normal.        Judgment: Judgment normal.      Lab Results  Component Value Date   WBC 6.3 11/17/2019   HGB 11.4 (L) 11/17/2019   HCT 36.0 (L) 11/17/2019   MCV 85.7 11/17/2019   PLT 151 11/17/2019     Chemistry      Component Value Date/Time   NA 139 11/17/2019 1027   NA 136 10/20/2019 1216   K 4.3 11/17/2019 1027   CL 106 11/17/2019 1027   CO2 25 11/17/2019 1027   BUN 16 11/17/2019 1027   BUN 13 10/20/2019 1216   CREATININE 0.66 11/17/2019 1027   CREATININE 0.64 (L) 08/29/2015 1004      Component Value Date/Time   CALCIUM 9.8 11/17/2019 1027   ALKPHOS 108 11/17/2019 1027   AST 15 11/17/2019 1027   ALT 14 11/17/2019 1027   BILITOT 0.3 11/17/2019 1027      Impression and Plan: [name] is a very nice 69 year old white male.  He has a very unusual problem with respect to the  carcinoid in the cecum.  Thankfully this is a carcinoid and not an actual adenocarcinoma.  Again I do think that he would be at risk for recurrent disease given the 5+ lymph nodes in the lymphovascular space invasion.  As such, we will try Somatuline for him.  I  think that 9 months of treatment would be reasonable.  We will start his treatment today.  I really do not anticipate any side effects from the Somatuline.  We will go ahead and plan on getting him back in 1 month for his second cycle of the Somatuline.    Volanda Napoleon, MD 6/18/20211:02 PM

## 2019-12-15 ENCOUNTER — Telehealth: Payer: Self-pay | Admitting: Family

## 2019-12-15 ENCOUNTER — Encounter: Payer: Self-pay | Admitting: Family

## 2019-12-15 ENCOUNTER — Inpatient Hospital Stay (HOSPITAL_BASED_OUTPATIENT_CLINIC_OR_DEPARTMENT_OTHER): Payer: Medicare Other | Admitting: Family

## 2019-12-15 ENCOUNTER — Inpatient Hospital Stay: Payer: Medicare Other

## 2019-12-15 ENCOUNTER — Encounter: Payer: Self-pay | Admitting: *Deleted

## 2019-12-15 ENCOUNTER — Inpatient Hospital Stay: Payer: Medicare Other | Attending: Hematology & Oncology

## 2019-12-15 ENCOUNTER — Other Ambulatory Visit: Payer: Self-pay

## 2019-12-15 VITALS — BP 144/92 | HR 68 | Temp 98.5°F | Resp 16 | Ht 71.0 in | Wt 225.0 lb

## 2019-12-15 DIAGNOSIS — C7A8 Other malignant neuroendocrine tumors: Secondary | ICD-10-CM

## 2019-12-15 DIAGNOSIS — Z79899 Other long term (current) drug therapy: Secondary | ICD-10-CM | POA: Diagnosis not present

## 2019-12-15 DIAGNOSIS — C7B01 Secondary carcinoid tumors of distant lymph nodes: Secondary | ICD-10-CM | POA: Insufficient documentation

## 2019-12-15 DIAGNOSIS — C7A021 Malignant carcinoid tumor of the cecum: Secondary | ICD-10-CM | POA: Diagnosis not present

## 2019-12-15 LAB — CMP (CANCER CENTER ONLY)
ALT: 22 U/L (ref 0–44)
AST: 22 U/L (ref 15–41)
Albumin: 4.6 g/dL (ref 3.5–5.0)
Alkaline Phosphatase: 102 U/L (ref 38–126)
Anion gap: 11 (ref 5–15)
BUN: 14 mg/dL (ref 8–23)
CO2: 24 mmol/L (ref 22–32)
Calcium: 9.7 mg/dL (ref 8.9–10.3)
Chloride: 104 mmol/L (ref 98–111)
Creatinine: 0.76 mg/dL (ref 0.61–1.24)
GFR, Est AFR Am: >60 mL/min (ref >60)
GFR, Estimated: >60 mL/min (ref >60)
Glucose, Bld: 144 mg/dL — ABNORMAL HIGH (ref 70–99)
Potassium: 4.1 mmol/L (ref 3.5–5.1)
Sodium: 139 mmol/L (ref 135–145)
Total Bilirubin: 0.4 mg/dL (ref 0.3–1.2)
Total Protein: 7.2 g/dL (ref 6.5–8.1)

## 2019-12-15 LAB — CBC WITH DIFFERENTIAL (CANCER CENTER ONLY)
Abs Immature Granulocytes: 0.04 10*3/uL (ref 0.00–0.07)
Basophils Absolute: 0 10*3/uL (ref 0.0–0.1)
Basophils Relative: 1 %
Eosinophils Absolute: 0.3 10*3/uL (ref 0.0–0.5)
Eosinophils Relative: 5 %
HCT: 41.8 % (ref 39.0–52.0)
Hemoglobin: 13.4 g/dL (ref 13.0–17.0)
Immature Granulocytes: 1 %
Lymphocytes Relative: 23 %
Lymphs Abs: 1.6 10*3/uL (ref 0.7–4.0)
MCH: 26.3 pg (ref 26.0–34.0)
MCHC: 32.1 g/dL (ref 30.0–36.0)
MCV: 82 fL (ref 80.0–100.0)
Monocytes Absolute: 0.7 10*3/uL (ref 0.1–1.0)
Monocytes Relative: 10 %
Neutro Abs: 4.3 10*3/uL (ref 1.7–7.7)
Neutrophils Relative %: 60 %
Platelet Count: 165 10*3/uL (ref 150–400)
RBC: 5.1 MIL/uL (ref 4.22–5.81)
RDW: 14.9 % (ref 11.5–15.5)
WBC Count: 7 10*3/uL (ref 4.0–10.5)
nRBC: 0 % (ref 0.0–0.2)

## 2019-12-15 MED ORDER — LANREOTIDE ACETATE 120 MG/0.5ML ~~LOC~~ SOLN
120.0000 mg | Freq: Once | SUBCUTANEOUS | Status: AC
  Administered 2019-12-15: 120 mg via SUBCUTANEOUS

## 2019-12-15 MED ORDER — LANREOTIDE ACETATE 120 MG/0.5ML ~~LOC~~ SOLN
SUBCUTANEOUS | Status: AC
  Filled 2019-12-15: qty 120

## 2019-12-15 NOTE — Patient Instructions (Signed)
Lanreotide injection What is this medicine? LANREOTIDE (lan REE oh tide) is used to reduce blood levels of growth hormone in patients with a condition called acromegaly. It also works to slow or stop tumor growth in patients with neuroendocrine tumors and treat carcinoid syndrome. This medicine may be used for other purposes; ask your health care provider or pharmacist if you have questions. COMMON BRAND NAME(S): Somatuline [place] What should I tell my health care provider before I take this medicine? They need to know if you have any of these conditions:  diabetes  gallbladder disease  heart disease  kidney disease  liver disease  thyroid disease  an unusual or allergic reaction to lanreotide, other medicines, foods, dyes, or preservatives  pregnant or trying to get pregnant  breast-feeding How should I use this medicine? This medicine is for injection under the skin. It is given by a health care professional in a hospital or clinic setting. Contact your pediatrician or health care professional regarding the use of this medicine in children. Special care may be needed. Overdosage: If you think you have taken too much of this medicine contact a poison control center or emergency room at once. NOTE: This medicine is only for you. Do not share this medicine with others. What if I miss a dose? It is important not to miss your dose. Call your doctor or health care professional if you are unable to keep an appointment. What may interact with this medicine? This medicine may interact with the following medications:  bromocriptine  cyclosporine  certain medicines for blood pressure, heart disease, irregular heart beat  certain medicines for diabetes  quinidine  terfenadine This list may not describe all possible interactions. Give your health care provider a list of all the medicines, herbs, non-prescription drugs, or dietary supplements you use. Also tell them if you smoke,  drink alcohol, or use illegal drugs. Some items may interact with your medicine. What should I watch for while using this medicine? Tell your doctor or healthcare professional if your symptoms do not start to get better or if they get worse. Visit your doctor or health care professional for regular checks on your progress. Your condition will be monitored carefully while you are receiving this medicine. This medicine may increase blood sugar. Ask your healthcare provider if changes in diet or medicines are needed if you have diabetes. You may need blood work done while you are taking this medicine. Women should inform their doctor if they wish to become pregnant or think they might be pregnant. There is a potential for serious side effects to an unborn child. Talk to your health care professional or pharmacist for more information. Do not breast-feed an infant while taking this medicine or for 6 months after stopping it. This medicine has caused ovarian failure in some women. This medicine may interfere with the ability to have a child. Talk with your doctor or health care professional if you are concerned about your fertility. What side effects may I notice from receiving this medicine? Side effects that you should report to your doctor or health care professional as soon as possible:  allergic reactions like skin rash, itching or hives, swelling of the face, lips, or tongue  increased blood pressure  severe stomach pain  signs and symptoms of hgh blood sugar such as being more thirsty or hungry or having to urinate more than normal. You may also feel very tired or have blurry vision.  signs and symptoms of low blood   sugar such as feeling anxious; confusion; dizziness; increased hunger; unusually weak or tired; sweating; shakiness; cold; irritable; headache; blurred vision; fast heartbeat; loss of consciousness  unusually slow heartbeat Side effects that usually do not require medical  attention (report to your doctor or health care professional if they continue or are bothersome):  constipation  diarrhea  dizziness  headache  muscle pain  muscle spasms  nausea  pain, redness, or irritation at site where injected This list may not describe all possible side effects. Call your doctor for medical advice about side effects. You may report side effects to FDA at 1-800-FDA-1088. Where should I keep my medicine? This drug is given in a hospital or clinic and will not be stored at home. NOTE: This sheet is a summary. It may not cover all possible information. If you have questions about this medicine, talk to your doctor, pharmacist, or health care provider.  2020 Elsevier/Gold Standard (2018-02-25 09:13:08)  

## 2019-12-15 NOTE — Progress Notes (Signed)
Oncology Nurse Navigator Documentation  Oncology Nurse Navigator Flowsheets 12/15/2019  Abnormal Finding Date -  Confirmed Diagnosis Date -  Planned Course of Treatment -  Phase of Treatment -  Chemotherapy Pending- Reason: -  Chemotherapy Actual Start Date: -  Navigator Follow Up Date: 01/16/2020  Navigator Follow Up Reason: Follow-up Appointment;Chemotherapy  Navigator Location [facility]  Referral Date to RadOnc/MedOnc -  Navigator Encounter Type Follow-up Appt  Telephone -  Treatment Initiated Date 11/17/2019  Patient Visit Type MedOnc  Treatment Phase Active Tx  Barriers/Navigation Needs Anxiety;Pain;Work Stage manager -  Interventions Psycho-Social Support  Acuity Level 2-Minimal Needs (1-2 Barriers Identified)  Coordination of Care -  Education Method -  Support Groups/Services Friends and Family  Time Spent with Patient 69

## 2019-12-15 NOTE — Telephone Encounter (Signed)
Appointments scheduled calendar printed & mailed per 7/15 los 

## 2019-12-15 NOTE — Progress Notes (Signed)
Hematology and Oncology Follow Up Visit  CHAITANYA AMEDEE [id number] November 13, 1950 69 y.o. 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 Ghosh is here today for follow-up and Somatuline injection. He is doing quite well but states that his days and nights are mixed up and he is not sleeping well at night. He naps all day.  He is ready to go back to work soon. He gets bored at home.  Chromogranin A in May was 60.4. Today's result is pending.  No fever, chills, n/v, cough, rash, dizziness, SOB, chest pain, palpitations, abdominal pain or changes in bowel or bladder habits.  No swelling, tenderness, numbness or tingling in his extremities.  No falls or syncopal episodes to report.  He has maintained a good appetite and is staying well hydrated. His weight is stable.   ECOG Performance Status: 0 - Asymptomatic  Medications:  Allergies as of 12/15/2019   No Known Allergies     Medication List       Accurate as of December 15, 2019 12:03 PM. If you have any questions, ask your nurse or doctor.        Acetaminophen 500 MG capsule Take 500-1,000 mg by mouth every 6 (six) hours as needed (headaches or body aches).   amLODipine 5 MG tablet Commonly known as: NORVASC TAKE 1 TABLET BY MOUTH  DAILY   atorvastatin 40 MG tablet Commonly known as: LIPITOR TAKE 1 TABLET BY MOUTH AT  BEDTIME   CENTRUM SILVER 50+MEN PO Take 1 tablet by mouth daily.   clotrimazole 1 % cream Commonly known as: LOTRIMIN Apply topically 2 (two) times daily.   Eliquis 5 MG Tabs tablet Generic drug: apixaban TAKE 1 TABLET BY MOUTH  TWICE DAILY   Entresto 24-26 MG Generic drug: sacubitril-valsartan TAKE 1 TABLET BY MOUTH  TWICE DAILY   furosemide 40 MG tablet Commonly known as: LASIX Take 1 tablet (40 mg total) by mouth  daily.   levocetirizine 5 MG tablet Commonly known as: XYZAL Take 5 mg by mouth at bedtime as needed for allergies (or sinus issues).   metoprolol tartrate 100 MG tablet Commonly known as: LOPRESSOR TAKE 1 TABLET BY MOUTH  TWICE DAILY   mupirocin ointment 2 % Commonly known as: Bactroban Apply twice a day to abdominal wound   NON FORMULARY Take 1 tablet by mouth at bedtime as needed (Sleep 3).   OVER THE COUNTER MEDICATION Take 1-2 tablets by mouth See admin instructions. Unnamed allergy/sinus tablet: Take 1-2 tablets by mouth once a day for allergies, rhinitis, or stopped up ears   oxyCODONE 5 MG immediate release tablet Commonly known as: Oxy IR/ROXICODONE Take 1 tablet (5 mg total) by mouth every 6 (six) hours as needed for breakthrough pain.   VITAMIN D PO Take 1 tablet by mouth daily.       Allergies: No Known Allergies  Past Medical History, Surgical history, Social history, and Family History were reviewed and updated.  Review of Systems: All other 10 point review of systems is negative.   Physical Exam:  vitals were not taken for this visit.   Wt Readings from Last 3 Encounters:  11/17/19 229 lb (103.9 kg)  10/21/19 217 lb 12.8 oz (98.8 kg)  10/20/19 216 lb 12.8 oz (98.3 kg)  Ocular: Sclerae unicteric, pupils equal, round and reactive to light Ear-nose-throat: Oropharynx clear, dentition fair Lymphatic: No cervical or supraclavicular adenopathy Lungs no rales or rhonchi, good excursion bilaterally Heart regular rate and rhythm, no murmur appreciated Abd soft, nontender, positive bowel sounds, no liver or spleen tip palpated on exam, no fluid wave  MSK no focal spinal tenderness, no joint edema Neuro: non-focal, well-oriented, appropriate affect Breasts: Deferred   Lab Results  Component Value Date   WBC 7.0 12/15/2019   HGB 13.4 12/15/2019   HCT 41.8 12/15/2019   MCV 82.0 12/15/2019   PLT 165 12/15/2019   Lab Results  Component Value Date    FERRITIN 36 11/17/2019   IRON 33 (L) 11/17/2019   TIBC 394 11/17/2019   UIBC 361 11/17/2019   IRONPCTSAT 8 (L) 11/17/2019   Lab Results  Component Value Date   RBC 5.10 12/15/2019   No results found for: KPAFRELGTCHN, LAMBDASER, KAPLAMBRATIO No results found for: IGGSERUM, IGA, IGMSERUM No results found for: Odetta Pink, SPEI   Chemistry      Component Value Date/Time   NA 139 11/17/2019 1027   NA 136 10/20/2019 1216   K 4.3 11/17/2019 1027   CL 106 11/17/2019 1027   CO2 25 11/17/2019 1027   BUN 16 11/17/2019 1027   BUN 13 10/20/2019 1216   CREATININE 0.66 11/17/2019 1027   CREATININE 0.64 (L) 08/29/2015 1004      Component Value Date/Time   CALCIUM 9.8 11/17/2019 1027   ALKPHOS 108 11/17/2019 1027   AST 15 11/17/2019 1027   ALT 14 11/17/2019 1027   BILITOT 0.3 11/17/2019 1027       Impression and Plan: Mr. Jallow is a very pleasant 69 yo caucasian gentleman with primary carcinoid of the cecum - Stage III (T3N2Mx), 5+ lymph nodes in the lymphovascular space.  He is tolerating Somatuline well and has no complaints at this time.  We will proceed with treatment 2/9.  We will plan to see him again in another month.  He can contact our office with any questions or concerns. We can certainly see him sooner if needed.   Laverna Peace, NP 7/15/202112:03 PM

## 2019-12-16 LAB — CHROMOGRANIN A: Chromogranin A (ng/mL): 23.7 ng/mL (ref 0.0–101.8)

## 2019-12-19 ENCOUNTER — Telehealth: Payer: Self-pay | Admitting: Hematology & Oncology

## 2019-12-19 NOTE — Telephone Encounter (Signed)
Appointments scheduled calendar printed & mailed per 7/15 los 

## 2020-01-16 ENCOUNTER — Inpatient Hospital Stay: Payer: Medicare Other | Admitting: Hematology & Oncology

## 2020-01-16 ENCOUNTER — Encounter: Payer: Self-pay | Admitting: *Deleted

## 2020-01-16 ENCOUNTER — Inpatient Hospital Stay: Payer: Medicare Other | Attending: Hematology & Oncology

## 2020-01-16 ENCOUNTER — Inpatient Hospital Stay: Payer: Medicare Other

## 2020-01-16 NOTE — Progress Notes (Signed)
Patient a no-show for his follow up appointment and treatment. Called an left message requesting call back to assess reason/possible needs/rescheduling for missed appointment.

## 2020-01-17 ENCOUNTER — Other Ambulatory Visit (HOSPITAL_BASED_OUTPATIENT_CLINIC_OR_DEPARTMENT_OTHER): Payer: Self-pay | Admitting: Student

## 2020-01-17 ENCOUNTER — Ambulatory Visit (HOSPITAL_COMMUNITY): Payer: Medicare Other

## 2020-01-17 ENCOUNTER — Encounter: Payer: Self-pay | Admitting: *Deleted

## 2020-01-17 ENCOUNTER — Other Ambulatory Visit (HOSPITAL_COMMUNITY): Payer: Self-pay | Admitting: Student

## 2020-01-17 DIAGNOSIS — R109 Unspecified abdominal pain: Secondary | ICD-10-CM

## 2020-01-17 NOTE — Progress Notes (Signed)
After several attempts was able to make contact with patient. He missed yesterday's appointment due to wound infection. He called his surgeon and was told to go to their office at the same time as our appointment. He now has a CT scan scheduled for tomorrow to assess the severity of infection. I will follow up with him later this week as to when we can reschedule his missed appointment and treatment. Patient in agreement with plan.  Oncology Nurse Navigator Documentation  Oncology Nurse Navigator Flowsheets 01/17/2020  Abnormal Finding Date -  Confirmed Diagnosis Date -  Planned Course of Treatment -  Phase of Treatment -  Chemotherapy Pending- Reason: -  Chemotherapy Actual Start Date: -  Navigator Follow Up Date: -  Navigator Follow Up Reason: -  Navigator Location [facility]  Referral Date to RadOnc/MedOnc -  Navigator Encounter Type Telephone  Telephone Appt Confirmation/Clarification  Treatment Initiated Date -  Patient Visit Type -  Treatment Phase -  Barriers/Navigation Needs -  Education -  Interventions -  Acuity -  Coordination of Care -  Education Method -  Support Groups/Services -  Time Spent with Patient 30   .

## 2020-01-18 ENCOUNTER — Other Ambulatory Visit: Payer: Self-pay

## 2020-01-18 ENCOUNTER — Ambulatory Visit (HOSPITAL_COMMUNITY)
Admission: RE | Admit: 2020-01-18 | Discharge: 2020-01-18 | Disposition: A | Discharge status: Home / Self Care | Payer: Medicare Other | Source: Ambulatory Visit | Attending: Student | Admitting: Student

## 2020-01-18 ENCOUNTER — Encounter (HOSPITAL_COMMUNITY): Payer: Self-pay

## 2020-01-18 DIAGNOSIS — R109 Unspecified abdominal pain: Secondary | ICD-10-CM | POA: Diagnosis not present

## 2020-01-18 LAB — POCT I-STAT CREATININE: Creatinine, Ser: 0.6 mg/dL — ABNORMAL LOW (ref 0.61–1.24)

## 2020-01-18 MED ORDER — IOHEXOL 9 MG/ML PO SOLN
500.0000 mL | ORAL | Status: AC
  Administered 2020-01-18 (×2): 500 mL via ORAL

## 2020-01-18 MED ORDER — IOHEXOL 9 MG/ML PO SOLN
ORAL | Status: AC
  Filled 2020-01-18: qty 1000

## 2020-01-18 MED ORDER — IOHEXOL 300 MG/ML  SOLN
100.0000 mL | Freq: Once | INTRAMUSCULAR | Status: AC | PRN
  Administered 2020-01-18: 100 mL via INTRAVENOUS

## 2020-01-18 MED ORDER — SODIUM CHLORIDE (PF) 0.9 % IJ SOLN
INTRAMUSCULAR | Status: AC
  Filled 2020-01-18: qty 50

## 2020-01-20 ENCOUNTER — Encounter: Payer: Self-pay | Admitting: *Deleted

## 2020-01-20 NOTE — Progress Notes (Signed)
Called patient x two. Left one message requesting that patient call back to reschedule missed appointment. He had his CT scan on Wednesday which didn't show any significant findings. Will continue to attempt contact to reschedule his appointment and treatment.   Oncology Nurse Navigator Documentation  Oncology Nurse Navigator Flowsheets 01/20/2020  Abnormal Finding Date -  Confirmed Diagnosis Date -  Planned Course of Treatment -  Phase of Treatment -  Chemotherapy Pending- Reason: -  Chemotherapy Actual Start Date: -  Navigator Follow Up Date: -  Navigator Follow Up Reason: -  Navigator Location [facility]  Referral Date to RadOnc/MedOnc -  Navigator Encounter Type Telephone  Telephone Appt Confirmation/Clarification  Treatment Initiated Date -  Patient Visit Type -  Treatment Phase -  Barriers/Navigation Needs -  Education -  Interventions -  Acuity -  Coordination of Care -  Education Method -  Support Groups/Services -  Time Spent with Patient 30

## 2020-01-26 ENCOUNTER — Encounter: Payer: Self-pay | Admitting: *Deleted

## 2020-01-26 NOTE — Progress Notes (Signed)
Continuing to attempt contact with patient to schedule follow up appointment. Will continue to follow.  Oncology Nurse Navigator Documentation  Oncology Nurse Navigator Flowsheets 01/26/2020  Abnormal Finding Date -  Confirmed Diagnosis Date -  Planned Course of Treatment -  Phase of Treatment -  Chemotherapy Pending- Reason: -  Chemotherapy Actual Start Date: -  Navigator Follow Up Date: -  Navigator Follow Up Reason: -  Navigator Location [facility]  Referral Date to RadOnc/MedOnc -  Navigator Encounter Type Telephone  Telephone Appt Confirmation/Clarification  Treatment Initiated Date -  Patient Visit Type -  Treatment Phase -  Barriers/Navigation Needs -  Education -  Interventions -  Acuity -  Coordination of Care -  Education Method -  Support Groups/Services -  Time Spent with Patient 15

## 2020-01-27 ENCOUNTER — Encounter: Payer: Self-pay | Admitting: *Deleted

## 2020-01-27 NOTE — Progress Notes (Signed)
Able to make verbal contact with patient today. His infection has cleared and he is ready to reschedule his missed appointments. Transferred to scheduler to make appointments.   Oncology Nurse Navigator Documentation  Oncology Nurse Navigator Flowsheets 01/27/2020  Abnormal Finding Date -  Confirmed Diagnosis Date -  Planned Course of Treatment -  Phase of Treatment -  Chemotherapy Pending- Reason: -  Chemotherapy Actual Start Date: -  Navigator Follow Up Date: 02/10/2020  Navigator Follow Up Reason: Follow-up Appointment;Chemotherapy  Navigator Location [facility]  Referral Date to RadOnc/MedOnc -  Navigator Encounter Type Telephone  Telephone Outgoing Call;Appt Confirmation/Clarification  Treatment Initiated Date -  Patient Visit Type MedOnc  Treatment Phase Active Tx  Barriers/Navigation Needs Anxiety;Pain;Work Conflicts;Coordination of Care  Education -  Interventions Coordination of Care;Psycho-Social Support  Acuity Level 2-Minimal Needs (1-2 Barriers Identified)  Coordination of Care Appts  Education Method -  Support Groups/Services Friends and Family  Time Spent with Patient 30

## 2020-02-10 ENCOUNTER — Inpatient Hospital Stay: Payer: Medicare Other | Attending: Hematology & Oncology

## 2020-02-10 ENCOUNTER — Inpatient Hospital Stay: Payer: Medicare Other

## 2020-02-10 ENCOUNTER — Inpatient Hospital Stay (HOSPITAL_BASED_OUTPATIENT_CLINIC_OR_DEPARTMENT_OTHER): Payer: Medicare Other | Admitting: Hematology & Oncology

## 2020-02-10 ENCOUNTER — Other Ambulatory Visit: Payer: Self-pay

## 2020-02-10 ENCOUNTER — Encounter: Payer: Self-pay | Admitting: *Deleted

## 2020-02-10 VITALS — BP 103/77 | HR 81 | Temp 97.9°F | Resp 19 | Wt 239.5 lb

## 2020-02-10 DIAGNOSIS — C7A8 Other malignant neuroendocrine tumors: Secondary | ICD-10-CM

## 2020-02-10 DIAGNOSIS — C7B01 Secondary carcinoid tumors of distant lymph nodes: Secondary | ICD-10-CM | POA: Insufficient documentation

## 2020-02-10 DIAGNOSIS — C7A021 Malignant carcinoid tumor of the cecum: Secondary | ICD-10-CM | POA: Insufficient documentation

## 2020-02-10 LAB — CBC WITH DIFFERENTIAL (CANCER CENTER ONLY)
Abs Immature Granulocytes: 0.04 10*3/uL (ref 0.00–0.07)
Basophils Absolute: 0.1 10*3/uL (ref 0.0–0.1)
Basophils Relative: 1 %
Eosinophils Absolute: 0.4 10*3/uL (ref 0.0–0.5)
Eosinophils Relative: 5 %
HCT: 39.9 % (ref 39.0–52.0)
Hemoglobin: 12.9 g/dL — ABNORMAL LOW (ref 13.0–17.0)
Immature Granulocytes: 1 %
Lymphocytes Relative: 24 %
Lymphs Abs: 1.6 10*3/uL (ref 0.7–4.0)
MCH: 26.1 pg (ref 26.0–34.0)
MCHC: 32.3 g/dL (ref 30.0–36.0)
MCV: 80.6 fL (ref 80.0–100.0)
Monocytes Absolute: 0.8 10*3/uL (ref 0.1–1.0)
Monocytes Relative: 12 %
Neutro Abs: 3.8 10*3/uL (ref 1.7–7.7)
Neutrophils Relative %: 57 %
Platelet Count: 149 10*3/uL — ABNORMAL LOW (ref 150–400)
RBC: 4.95 MIL/uL (ref 4.22–5.81)
RDW: 18.9 % — ABNORMAL HIGH (ref 11.5–15.5)
WBC Count: 6.7 10*3/uL (ref 4.0–10.5)
nRBC: 0 % (ref 0.0–0.2)

## 2020-02-10 LAB — CMP (CANCER CENTER ONLY)
ALT: 20 U/L (ref 0–44)
AST: 19 U/L (ref 15–41)
Albumin: 3.8 g/dL (ref 3.5–5.0)
Alkaline Phosphatase: 93 U/L (ref 38–126)
Anion gap: 7 (ref 5–15)
BUN: 15 mg/dL (ref 8–23)
CO2: 25 mmol/L (ref 22–32)
Calcium: 9 mg/dL (ref 8.9–10.3)
Chloride: 105 mmol/L (ref 98–111)
Creatinine: 0.63 mg/dL (ref 0.61–1.24)
GFR, Est AFR Am: >60 mL/min (ref >60)
GFR, Estimated: >60 mL/min (ref >60)
Glucose, Bld: 142 mg/dL — ABNORMAL HIGH (ref 70–99)
Potassium: 4.1 mmol/L (ref 3.5–5.1)
Sodium: 137 mmol/L (ref 135–145)
Total Bilirubin: 0.3 mg/dL (ref 0.3–1.2)
Total Protein: 6.2 g/dL — ABNORMAL LOW (ref 6.5–8.1)

## 2020-02-10 MED ORDER — LANREOTIDE ACETATE 120 MG/0.5ML ~~LOC~~ SOLN
SUBCUTANEOUS | Status: AC
  Filled 2020-02-10: qty 120

## 2020-02-10 MED ORDER — LANREOTIDE ACETATE 120 MG/0.5ML ~~LOC~~ SOLN
120.0000 mg | Freq: Once | SUBCUTANEOUS | Status: AC
  Administered 2020-02-10: 120 mg via SUBCUTANEOUS

## 2020-02-10 NOTE — Progress Notes (Signed)
Hematology and Oncology Follow Up Visit  [name] [id number] [birth year] 69 y.o. 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  Overall, he is doing quite nicely.  He forgot the laparotomy infection healed.  He just put on over-the-counter antibiotic cream.  He has had no problems with bowels or bladder.  He has had no diarrhea.  He has had no cough or shortness of breath.  He has had no fever.  There has been no nausea or vomiting.  His chromogranin A level back in July was 24.  This is coming down nicely.  He has had no leg swelling.  There is been no rashes.  Overall, his performance status is ECOG 0.    Medications:  Allergies as of 02/10/2020   No Known Allergies     Medication List       Accurate as of February 10, 2020  8:32 AM. If you have any questions, ask your nurse or doctor.        Acetaminophen 500 MG capsule Take 500-1,000 mg by mouth every 6 (six) hours as needed (headaches or body aches).   amLODipine 5 MG tablet Commonly known as: NORVASC TAKE 1 TABLET BY MOUTH  DAILY   atorvastatin 40 MG tablet Commonly known as: LIPITOR TAKE 1 TABLET BY MOUTH AT  BEDTIME   CENTRUM SILVER 50+MEN PO Take 1 tablet by mouth daily.   clotrimazole 1 % cream Commonly known as: LOTRIMIN Apply topically 2 (two) times daily.   Eliquis 5 MG Tabs tablet Generic drug: apixaban TAKE 1 TABLET BY MOUTH  TWICE DAILY   Entresto 24-26 MG Generic drug: sacubitril-valsartan TAKE 1 TABLET BY MOUTH  TWICE DAILY   furosemide 40 MG tablet Commonly known as: LASIX Take 1 tablet (40 mg total) by mouth daily.   levocetirizine 5 MG tablet Commonly known as: XYZAL Take 5 mg by mouth at bedtime as needed for allergies (or sinus  issues).   metoprolol tartrate 100 MG tablet Commonly known as: LOPRESSOR TAKE 1 TABLET BY MOUTH  TWICE DAILY   mupirocin ointment 2 % Commonly known as: Bactroban Apply twice a day to abdominal wound   NON FORMULARY Take 1 tablet by mouth at bedtime as needed (Sleep 3).   OVER THE COUNTER MEDICATION Take 1-2 tablets by mouth See admin instructions. Unnamed allergy/sinus tablet: Take 1-2 tablets by mouth once a day for allergies, rhinitis, or stopped up ears   oxyCODONE 5 MG immediate release tablet Commonly known as: Oxy IR/ROXICODONE Take 1 tablet (5 mg total) by mouth every 6 (six) hours as needed for breakthrough pain.   VITAMIN D PO Take 1 tablet by mouth daily.       Allergies: No Known Allergies  Past Medical History, Surgical history, Social history, and Family History were reviewed and updated.  Review of Systems: Review of Systems  Constitutional: Negative.   HENT: Negative.   Eyes: Negative.   Respiratory: Negative.   Cardiovascular: Negative.   Gastrointestinal: Negative.   Genitourinary: Negative.   Musculoskeletal: Negative.   Skin: Negative.   Neurological: Negative.   Endo/Heme/Allergies: Negative.   Psychiatric/Behavioral: Negative.      Physical Exam:  weight is 239 lb 8 oz (108.6  kg). His oral temperature is 97.9 F (36.6 C). His blood pressure is 103/77 and his pulse is 81. His respiration is 19 and oxygen saturation is 97%.   Wt Readings from Last 3 Encounters:  02/10/20 239 lb 8 oz (108.6 kg)  12/15/19 225 lb (102.1 kg)  11/17/19 229 lb (103.9 kg)    Physical Exam Vitals reviewed.  HENT:     Head: Normocephalic and atraumatic.  Eyes:     Pupils: Pupils are equal, round, and reactive to light.  Cardiovascular:     Rate and Rhythm: Normal rate and regular rhythm.     Heart sounds: Normal heart sounds.  Pulmonary:     Effort: Pulmonary effort is normal.     Breath sounds: Normal breath sounds.  Abdominal:     General: Bowel  sounds are normal.     Palpations: Abdomen is soft.     Comments: Abdomen is soft.  Has well-healed laparoscopy scar.  There is no fullness or thickness associated with the scar.  There is no associated erythema.  He has no fluid wave.  There is no palpable liver or spleen tip.  Musculoskeletal:        General: No tenderness or deformity. Normal range of motion.     Cervical back: Normal range of motion.  Lymphadenopathy:     Cervical: No cervical adenopathy.  Skin:    General: Skin is warm and dry.     Findings: No erythema or rash.  Neurological:     Mental Status: He is alert and oriented to person, place, and time.  Psychiatric:        Behavior: Behavior normal.        Thought Content: Thought content normal.        Judgment: Judgment normal.       Lab Results  Component Value Date   WBC 6.7 02/10/2020   HGB 12.9 (L) 02/10/2020   HCT 39.9 02/10/2020   MCV 80.6 02/10/2020   PLT 149 (L) 02/10/2020   Lab Results  Component Value Date   FERRITIN 36 11/17/2019   IRON 33 (L) 11/17/2019   TIBC 394 11/17/2019   UIBC 361 11/17/2019   IRONPCTSAT 8 (L) 11/17/2019   Lab Results  Component Value Date   RBC 4.95 02/10/2020   No results found for: KPAFRELGTCHN, LAMBDASER, KAPLAMBRATIO No results found for: IGGSERUM, IGA, IGMSERUM No results found for: Odetta Pink, SPEI   Chemistry      Component Value Date/Time   NA 137 02/10/2020 0751   NA 136 10/20/2019 1216   K 4.1 02/10/2020 0751   CL 105 02/10/2020 0751   CO2 25 02/10/2020 0751   BUN 15 02/10/2020 0751   BUN 13 10/20/2019 1216   CREATININE 0.63 02/10/2020 0751   CREATININE 0.64 (L) 08/29/2015 1004      Component Value Date/Time   CALCIUM 9.0 02/10/2020 0751   ALKPHOS 93 02/10/2020 0751   AST 19 02/10/2020 0751   ALT 20 02/10/2020 0751   BILITOT 0.3 02/10/2020 0751       Impression and Plan: [name] is a very pleasant 69 yo caucasian gentleman with  primary carcinoid of the cecum - Stage III (T3N2Mx), 5+ lymph nodes.  There was lymphovascular space invasion.   He is tolerating Somatuline well and has no complaints at this time.   We will go ahead with his fourth cycle of the Somatuline.  I do think that he would be  a candidate for Somatuline after his resection just because of the lymph nodes in the lymphovascular space invasion.  We will plan to see him again in another month.   He can contact our office with any questions or concerns. We can certainly see him sooner if needed.   Volanda Napoleon, MD 9/10/20218:32 AM

## 2020-02-10 NOTE — Progress Notes (Signed)
Oncology Nurse Navigator Documentation  Oncology Nurse Navigator Flowsheets 02/10/2020  Abnormal Finding Date -  Confirmed Diagnosis Date -  Planned Course of Treatment -  Phase of Treatment -  Chemotherapy Pending- Reason: -  Chemotherapy Actual Start Date: -  Navigator Follow Up Date: 03/09/2020  Navigator Follow Up Reason: Follow-up Appointment;Chemotherapy  Navigator Location [facility]  Referral Date to RadOnc/MedOnc -  Navigator Encounter Type Appt/Treatment Plan Review  Telephone -  Treatment Initiated Date -  Patient Visit Type MedOnc  Treatment Phase Active Tx  Barriers/Navigation Needs Anxiety;Pain;Work Conflicts;Coordination of Care  Education -  Interventions None Required  Acuity Level 2-Minimal Needs (1-2 Barriers Identified)  Coordination of Care -  Education Method -  Support Groups/Services Friends and Family  Time Spent with Patient 15

## 2020-02-10 NOTE — Patient Instructions (Signed)
Lanreotide injection What is this medicine? LANREOTIDE (lan REE oh tide) is used to reduce blood levels of growth hormone in patients with a condition called acromegaly. It also works to slow or stop tumor growth in patients with neuroendocrine tumors and treat carcinoid syndrome. This medicine may be used for other purposes; ask your health care provider or pharmacist if you have questions. COMMON BRAND NAME(S): Somatuline [place] What should I tell my health care provider before I take this medicine? They need to know if you have any of these conditions:  diabetes  gallbladder disease  heart disease  kidney disease  liver disease  thyroid disease  an unusual or allergic reaction to lanreotide, other medicines, foods, dyes, or preservatives  pregnant or trying to get pregnant  breast-feeding How should I use this medicine? This medicine is for injection under the skin. It is given by a health care professional in a hospital or clinic setting. Contact your pediatrician or health care professional regarding the use of this medicine in children. Special care may be needed. Overdosage: If you think you have taken too much of this medicine contact a poison control center or emergency room at once. NOTE: This medicine is only for you. Do not share this medicine with others. What if I miss a dose? It is important not to miss your dose. Call your doctor or health care professional if you are unable to keep an appointment. What may interact with this medicine? This medicine may interact with the following medications:  bromocriptine  cyclosporine  certain medicines for blood pressure, heart disease, irregular heart beat  certain medicines for diabetes  quinidine  terfenadine This list may not describe all possible interactions. Give your health care provider a list of all the medicines, herbs, non-prescription drugs, or dietary supplements you use. Also tell them if you smoke,  drink alcohol, or use illegal drugs. Some items may interact with your medicine. What should I watch for while using this medicine? Tell your doctor or healthcare professional if your symptoms do not start to get better or if they get worse. Visit your doctor or health care professional for regular checks on your progress. Your condition will be monitored carefully while you are receiving this medicine. This medicine may increase blood sugar. Ask your healthcare provider if changes in diet or medicines are needed if you have diabetes. You may need blood work done while you are taking this medicine. Women should inform their doctor if they wish to become pregnant or think they might be pregnant. There is a potential for serious side effects to an unborn child. Talk to your health care professional or pharmacist for more information. Do not breast-feed an infant while taking this medicine or for 6 months after stopping it. This medicine has caused ovarian failure in some women. This medicine may interfere with the ability to have a child. Talk with your doctor or health care professional if you are concerned about your fertility. What side effects may I notice from receiving this medicine? Side effects that you should report to your doctor or health care professional as soon as possible:  allergic reactions like skin rash, itching or hives, swelling of the face, lips, or tongue  increased blood pressure  severe stomach pain  signs and symptoms of hgh blood sugar such as being more thirsty or hungry or having to urinate more than normal. You may also feel very tired or have blurry vision.  signs and symptoms of low blood   sugar such as feeling anxious; confusion; dizziness; increased hunger; unusually weak or tired; sweating; shakiness; cold; irritable; headache; blurred vision; fast heartbeat; loss of consciousness  unusually slow heartbeat Side effects that usually do not require medical  attention (report to your doctor or health care professional if they continue or are bothersome):  constipation  diarrhea  dizziness  headache  muscle pain  muscle spasms  nausea  pain, redness, or irritation at site where injected This list may not describe all possible side effects. Call your doctor for medical advice about side effects. You may report side effects to FDA at 1-800-FDA-1088. Where should I keep my medicine? This drug is given in a hospital or clinic and will not be stored at home. NOTE: This sheet is a summary. It may not cover all possible information. If you have questions about this medicine, talk to your doctor, pharmacist, or health care provider.  2020 Elsevier/Gold Standard (2018-02-25 09:13:08)  

## 2020-02-13 LAB — CHROMOGRANIN A: Chromogranin A (ng/mL): 21 ng/mL (ref 0.0–101.8)

## 2020-03-09 ENCOUNTER — Inpatient Hospital Stay: Payer: Medicare Other | Admitting: Hematology & Oncology

## 2020-03-09 ENCOUNTER — Inpatient Hospital Stay: Payer: Medicare Other

## 2020-03-13 ENCOUNTER — Encounter: Payer: Self-pay | Admitting: *Deleted

## 2020-03-13 NOTE — Progress Notes (Signed)
Patient cancelled his appointment last week due to illness. Calling to reschedule. Message left on personal voicemail.  Oncology Nurse Navigator Documentation  Oncology Nurse Navigator Flowsheets 03/13/2020  Abnormal Finding Date -  Confirmed Diagnosis Date -  Planned Course of Treatment -  Phase of Treatment -  Chemotherapy Pending- Reason: -  Chemotherapy Actual Start Date: -  Navigator Follow Up Date: -  Navigator Follow Up Reason: -  Navigator Location [facility]  Referral Date to RadOnc/MedOnc -  Navigator Encounter Type Telephone  Telephone Appt Confirmation/Clarification;Outgoing Call  Treatment Initiated Date -  Patient Visit Type MedOnc  Treatment Phase Active Tx  Barriers/Navigation Needs Anxiety;Pain;Work Conflicts;Coordination of Care  Education -  Interventions Coordination of Care  Acuity Level 2-Minimal Needs (1-2 Barriers Identified)  Coordination of Care Appts  Education Method -  Support Groups/Services Friends and Family  Time Spent with Patient 30

## 2020-03-16 ENCOUNTER — Encounter: Payer: Self-pay | Admitting: *Deleted

## 2020-03-16 NOTE — Progress Notes (Signed)
Called patient on 03/15/20 and 03/16/20 both times leaving a message on patient's voicemail. Will continue to follow for appointment rescheduling.   Oncology Nurse Navigator Documentation  Oncology Nurse Navigator Flowsheets 03/16/2020  Abnormal Finding Date -  Confirmed Diagnosis Date -  Planned Course of Treatment -  Phase of Treatment -  Chemotherapy Pending- Reason: -  Chemotherapy Actual Start Date: -  Navigator Follow Up Date: -  Navigator Follow Up Reason: -  Navigator Location [facility]  Referral Date to RadOnc/MedOnc -  Navigator Encounter Type Appt/Treatment Plan Review  Telephone Outgoing Call;Appt Confirmation/Clarification  Treatment Initiated Date -  Patient Visit Type MedOnc  Treatment Phase Active Tx  Barriers/Navigation Needs Anxiety;Pain;Work Conflicts;Coordination of Care  Education -  Interventions Coordination of Care  Acuity Level 2-Minimal Needs (1-2 Barriers Identified)  Coordination of Care Appts  Education Method -  Support Groups/Services Friends and Family  Time Spent with Patient 15

## 2020-03-20 ENCOUNTER — Encounter: Payer: Self-pay | Admitting: *Deleted

## 2020-03-20 NOTE — Progress Notes (Signed)
Called patient to attempt to reschedule his MD apt and treatment. Message left. Since multiple attempts had been made, also called and left a message on his wife's voicemail.   If we don't hear back by the end of the week, will send a letter.  Oncology Nurse Navigator Documentation  Oncology Nurse Navigator Flowsheets 03/20/2020  Abnormal Finding Date -  Confirmed Diagnosis Date -  Planned Course of Treatment -  Phase of Treatment -  Chemotherapy Pending- Reason: -  Chemotherapy Actual Start Date: -  Navigator Follow Up Date: -  Navigator Follow Up Reason: -  Navigator Location [facility]  Referral Date to RadOnc/MedOnc -  Navigator Encounter Type Appt/Treatment Plan Review  Telephone Outgoing Call;Appt Confirmation/Clarification  Treatment Initiated Date -  Patient Visit Type MedOnc  Treatment Phase Active Tx  Barriers/Navigation Needs Anxiety;Pain;Work Conflicts;Coordination of Care  Education -  Interventions Coordination of Care  Acuity Level 2-Minimal Needs (1-2 Barriers Identified)  Coordination of Care Appts  Education Method -  Support Groups/Services Friends and Family  Time Spent with Patient 15

## 2020-03-23 ENCOUNTER — Encounter: Payer: Self-pay | Admitting: *Deleted

## 2020-03-23 NOTE — Progress Notes (Signed)
Still unable to make contact with patient after multiple attempts and messages left on patient, and wife's voicemail. Will send letter, to the patient's home, today requesting the patient call to reschedule.   Oncology Nurse Navigator Documentation  Oncology Nurse Navigator Flowsheets 03/23/2020  Abnormal Finding Date -  Confirmed Diagnosis Date -  Planned Course of Treatment -  Phase of Treatment -  Chemotherapy Pending- Reason: -  Chemotherapy Actual Start Date: -  Navigator Follow Up Date: -  Navigator Follow Up Reason: -  Navigator Location [facility]  Referral Date to RadOnc/MedOnc -  Navigator Encounter Type Appt/Treatment Plan Review;Telephone  Telephone Outgoing Call;Appt Confirmation/Clarification  Treatment Initiated Date -  Patient Visit Type MedOnc  Treatment Phase Active Tx  Barriers/Navigation Needs Anxiety;Pain;Work Conflicts;Coordination of Care  Education -  Interventions Coordination of Care  Acuity Level 2-Minimal Needs (1-2 Barriers Identified)  Coordination of Care Appts  Education Method -  Support Groups/Services Friends and Family  Time Spent with Patient 30

## 2020-03-27 ENCOUNTER — Telehealth: Payer: Self-pay | Admitting: Hematology & Oncology

## 2020-03-27 NOTE — Telephone Encounter (Signed)
Called and LM for patient w/ new date /time of appointments per 10/26 sch msg

## 2020-04-06 ENCOUNTER — Encounter: Payer: Self-pay | Admitting: *Deleted

## 2020-04-06 ENCOUNTER — Encounter: Payer: Self-pay | Admitting: Family

## 2020-04-06 ENCOUNTER — Inpatient Hospital Stay: Payer: Medicare Other

## 2020-04-06 ENCOUNTER — Other Ambulatory Visit: Payer: Self-pay

## 2020-04-06 ENCOUNTER — Inpatient Hospital Stay (HOSPITAL_BASED_OUTPATIENT_CLINIC_OR_DEPARTMENT_OTHER): Payer: Medicare Other | Admitting: Family

## 2020-04-06 ENCOUNTER — Inpatient Hospital Stay: Payer: Medicare Other | Attending: Hematology & Oncology

## 2020-04-06 VITALS — BP 126/72 | HR 77 | Temp 97.9°F | Resp 18 | Ht 71.0 in | Wt 239.4 lb

## 2020-04-06 DIAGNOSIS — C7A8 Other malignant neuroendocrine tumors: Secondary | ICD-10-CM

## 2020-04-06 DIAGNOSIS — Z9049 Acquired absence of other specified parts of digestive tract: Secondary | ICD-10-CM | POA: Diagnosis not present

## 2020-04-06 DIAGNOSIS — C7B01 Secondary carcinoid tumors of distant lymph nodes: Secondary | ICD-10-CM | POA: Diagnosis present

## 2020-04-06 DIAGNOSIS — C7A021 Malignant carcinoid tumor of the cecum: Secondary | ICD-10-CM | POA: Diagnosis not present

## 2020-04-06 DIAGNOSIS — Z79899 Other long term (current) drug therapy: Secondary | ICD-10-CM | POA: Insufficient documentation

## 2020-04-06 LAB — CBC WITH DIFFERENTIAL (CANCER CENTER ONLY)
Abs Immature Granulocytes: 0.02 10*3/uL (ref 0.00–0.07)
Basophils Absolute: 0.1 10*3/uL (ref 0.0–0.1)
Basophils Relative: 1 %
Eosinophils Absolute: 0.3 10*3/uL (ref 0.0–0.5)
Eosinophils Relative: 4 %
HCT: 43.6 % (ref 39.0–52.0)
Hemoglobin: 14.5 g/dL (ref 13.0–17.0)
Immature Granulocytes: 0 %
Lymphocytes Relative: 21 %
Lymphs Abs: 1.5 10*3/uL (ref 0.7–4.0)
MCH: 27.8 pg (ref 26.0–34.0)
MCHC: 33.3 g/dL (ref 30.0–36.0)
MCV: 83.5 fL (ref 80.0–100.0)
Monocytes Absolute: 0.6 10*3/uL (ref 0.1–1.0)
Monocytes Relative: 9 %
Neutro Abs: 4.7 10*3/uL (ref 1.7–7.7)
Neutrophils Relative %: 65 %
Platelet Count: 141 10*3/uL — ABNORMAL LOW (ref 150–400)
RBC: 5.22 MIL/uL (ref 4.22–5.81)
RDW: 17.2 % — ABNORMAL HIGH (ref 11.5–15.5)
WBC Count: 7.1 10*3/uL (ref 4.0–10.5)
nRBC: 0 % (ref 0.0–0.2)

## 2020-04-06 LAB — CMP (CANCER CENTER ONLY)
ALT: 29 U/L (ref 0–44)
AST: 26 U/L (ref 15–41)
Albumin: 4.1 g/dL (ref 3.5–5.0)
Alkaline Phosphatase: 99 U/L (ref 38–126)
Anion gap: 11 (ref 5–15)
BUN: 17 mg/dL (ref 8–23)
CO2: 24 mmol/L (ref 22–32)
Calcium: 9.2 mg/dL (ref 8.9–10.3)
Chloride: 101 mmol/L (ref 98–111)
Creatinine: 0.79 mg/dL (ref 0.61–1.24)
GFR, Estimated: >60 mL/min (ref >60)
Glucose, Bld: 147 mg/dL — ABNORMAL HIGH (ref 70–99)
Potassium: 3.4 mmol/L — ABNORMAL LOW (ref 3.5–5.1)
Sodium: 136 mmol/L (ref 135–145)
Total Bilirubin: 0.3 mg/dL (ref 0.3–1.2)
Total Protein: 7 g/dL (ref 6.5–8.1)

## 2020-04-06 LAB — LACTATE DEHYDROGENASE: LDH: 179 U/L (ref 98–192)

## 2020-04-06 MED ORDER — LANREOTIDE ACETATE 120 MG/0.5ML ~~LOC~~ SOLN
120.0000 mg | Freq: Once | SUBCUTANEOUS | Status: AC
  Administered 2020-04-06: 120 mg via SUBCUTANEOUS

## 2020-04-06 MED ORDER — LANREOTIDE ACETATE 120 MG/0.5ML ~~LOC~~ SOLN
SUBCUTANEOUS | Status: AC
  Filled 2020-04-06: qty 120

## 2020-04-06 NOTE — Progress Notes (Signed)
Hematology and Oncology Follow Up Visit  [name] [id number] 02-Jul-1950 69 y.o. 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 Chromogranin A level in September was 21. Today's result is pending.  He has a small dime sized scab in the middle of his mid line incision from previous colectomy surgery in May of this year with Dr. Kieth Brightly. He states that this will become infected at times and drain. No redness or edema noted at this time. Scab is dry and intact. No mass or abnormality noted on exam.  He has been putting triple antibiotic on that area daily.  He is concerned about something having been left in during surgery or recurrence carcinoid.  Patient states that he saw Dr. Kieth Brightly last week and he is aware and has assessed his wound when inflamed and draining. No additional intervention felt needed by surgery.  He denies any episodes of blood loss other than when his wound drains. No GI blood loss. No abnormal bruising or petechiae.  No fever, chills, n/v, cough, rash, dizziness, SOB, chest pain, palpitations, abdominal pain or changes in bowel or bladder habits.  No swelling, tenderness, numbness or tingling in his extremities.  No falls or syncopal episodes to report.  He has maintained a good appetite and is staying well hydrated. His weight is stable at 239 lbs.   ECOG Performance Status: 1 - Symptomatic but completely ambulatory  Medications:  Allergies as of 04/06/2020   No Known Allergies     Medication List       Accurate as of April 06, 2020  1:31 PM. If you have any questions, ask your nurse or doctor.        Acetaminophen 500 MG capsule Take 500-1,000 mg by mouth every 6 (six) hours as needed (headaches or body  aches).   amLODipine 5 MG tablet Commonly known as: NORVASC TAKE 1 TABLET BY MOUTH  DAILY   atorvastatin 40 MG tablet Commonly known as: LIPITOR TAKE 1 TABLET BY MOUTH AT  BEDTIME   CENTRUM SILVER 50+MEN PO Take 1 tablet by mouth daily.   clotrimazole 1 % cream Commonly known as: LOTRIMIN Apply topically 2 (two) times daily.   Eliquis 5 MG Tabs tablet Generic drug: apixaban TAKE 1 TABLET BY MOUTH  TWICE DAILY   Entresto 24-26 MG Generic drug: sacubitril-valsartan TAKE 1 TABLET BY MOUTH  TWICE DAILY   furosemide 40 MG tablet Commonly known as: LASIX Take 1 tablet (40 mg total) by mouth daily.   levocetirizine 5 MG tablet Commonly known as: XYZAL Take 5 mg by mouth at bedtime as needed for allergies (or sinus issues).   metoprolol tartrate 100 MG tablet Commonly known as: LOPRESSOR TAKE 1 TABLET BY MOUTH  TWICE DAILY   mupirocin ointment 2 % Commonly known as: Bactroban Apply twice a day to abdominal wound   NON FORMULARY Take 1 tablet by mouth at bedtime as needed (Sleep 3).   OVER THE COUNTER MEDICATION Take 1-2 tablets by mouth See admin instructions. Unnamed allergy/sinus tablet: Take 1-2 tablets by mouth once a day for allergies, rhinitis, or stopped up ears   oxyCODONE 5 MG immediate release tablet Commonly known as: Oxy IR/ROXICODONE Take 1 tablet (5 mg total) by mouth every 6 (six)  hours as needed for breakthrough pain.   VITAMIN D PO Take 1 tablet by mouth daily.       Allergies: No Known Allergies  Past Medical History, Surgical history, Social history, and Family History were reviewed and updated.  Review of Systems: All other 10 point review of systems is negative.   Physical Exam:  vitals were not taken for this visit.   Wt Readings from Last 3 Encounters:  02/10/20 239 lb 8 oz (108.6 kg)  12/15/19 225 lb (102.1 kg)  11/17/19 229 lb (103.9 kg)    Ocular: Sclerae unicteric, pupils equal, round and reactive to light Ear-nose-throat:  Oropharynx clear, dentition fair Lymphatic: No cervical or supraclavicular adenopathy Lungs no rales or rhonchi, good excursion bilaterally Heart regular rate and rhythm, no murmur appreciated Abd soft, nontender, positive bowel sounds MSK no focal spinal tenderness, no joint edema Neuro: non-focal, well-oriented, appropriate affect Breasts: Deferred   Lab Results  Component Value Date   WBC 6.7 02/10/2020   HGB 12.9 (L) 02/10/2020   HCT 39.9 02/10/2020   MCV 80.6 02/10/2020   PLT 149 (L) 02/10/2020   Lab Results  Component Value Date   FERRITIN 36 11/17/2019   IRON 33 (L) 11/17/2019   TIBC 394 11/17/2019   UIBC 361 11/17/2019   IRONPCTSAT 8 (L) 11/17/2019   Lab Results  Component Value Date   RBC 4.95 02/10/2020   No results found for: KPAFRELGTCHN, LAMBDASER, KAPLAMBRATIO No results found for: IGGSERUM, IGA, IGMSERUM No results found for: Odetta Pink, SPEI   Chemistry      Component Value Date/Time   NA 137 02/10/2020 0751   NA 136 10/20/2019 1216   K 4.1 02/10/2020 0751   CL 105 02/10/2020 0751   CO2 25 02/10/2020 0751   BUN 15 02/10/2020 0751   BUN 13 10/20/2019 1216   CREATININE 0.63 02/10/2020 0751   CREATININE 0.64 (L) 08/29/2015 1004      Component Value Date/Time   CALCIUM 9.0 02/10/2020 0751   ALKPHOS 93 02/10/2020 0751   AST 19 02/10/2020 0751   ALT 20 02/10/2020 0751   BILITOT 0.3 02/10/2020 0751       Impression and Plan: [name] is a very pleasant 69 yo caucasian gentleman with primary carcinoid of the cecum - Stage III (T3N2Mx), 5+ lymph nodes and lymphovascular space invasion.  We will proceed with Somatuline injection today as planned.  I spoke with Dr. Marin Olp at length about the patient's issue with incision and concerns for infection vs. recurrence. We will get a CT scan of the abdomen for further eval. The patient is agreeable to this and thankful for further assessment.    Follow-up in 1 month.  He was encouraged to contact our office with any questions or concerns.  Greater then 50% of his visit was spent counseling and coordinating care.   Laverna Peace, NP 11/5/20211:31 PM

## 2020-04-09 ENCOUNTER — Telehealth: Payer: Self-pay | Admitting: Hematology & Oncology

## 2020-04-09 LAB — IRON AND TIBC
Iron: 88 ug/dL (ref 42–163)
Saturation Ratios: 24 % (ref 20–55)
TIBC: 365 ug/dL (ref 202–409)
UIBC: 277 ug/dL (ref 117–376)

## 2020-04-09 LAB — FERRITIN: Ferritin: 37 ng/mL (ref 24–336)

## 2020-04-09 LAB — CHROMOGRANIN A: Chromogranin A (ng/mL): 22.2 ng/mL (ref 0.0–101.8)

## 2020-04-09 NOTE — Telephone Encounter (Signed)
Appointments scheduled calendar printed & mailed per 11/5 los 

## 2020-04-10 NOTE — Progress Notes (Signed)
Oncology Nurse Navigator Documentation  Oncology Nurse Navigator Flowsheets 04/06/2020  Abnormal Finding Date -  Confirmed Diagnosis Date -  Planned Course of Treatment -  Phase of Treatment -  Chemotherapy Pending- Reason: -  Chemotherapy Actual Start Date: -  Navigator Follow Up Date: 05/10/2020  Navigator Follow Up Reason: Follow-up Appointment  Navigator Location [facility]  Referral Date to RadOnc/MedOnc -  Navigator Encounter Type Treatment  Telephone -  Treatment Initiated Date -  Patient Visit Type MedOnc  Treatment Phase Active Tx  Barriers/Navigation Needs Anxiety;Pain;Work Conflicts;Coordination of Care  Education -  Interventions Psycho-Social Support  Acuity Level 2-Minimal Needs (1-2 Barriers Identified)  Coordination of Care -  Education Method -  Support Groups/Services Friends and Family  Time Spent with Patient 15

## 2020-04-16 ENCOUNTER — Telehealth: Payer: Self-pay

## 2020-04-16 NOTE — Telephone Encounter (Signed)
Recd inbasket to sch pt for CT.  Per imaging they had left 2 prior messages to sch CT.  I also called and left a vm with the appt, advised to p/u contrast and instructions at least the day prior to scan and mailed a calendar to the pt./.... AOM

## 2020-04-23 ENCOUNTER — Telehealth: Payer: Self-pay

## 2020-04-23 NOTE — Telephone Encounter (Signed)
Returned pts call as he left a vm to cancel ct due to work sch and to r/s in january , I am not sure he remembers that he has a lab and f/u as well so I asked that he c/b to confirm..... AOM

## 2020-05-09 ENCOUNTER — Ambulatory Visit (HOSPITAL_BASED_OUTPATIENT_CLINIC_OR_DEPARTMENT_OTHER): Admission: RE | Admit: 2020-05-09 | Payer: Medicare Other | Source: Ambulatory Visit

## 2020-05-09 ENCOUNTER — Other Ambulatory Visit: Payer: Medicare Other

## 2020-05-10 ENCOUNTER — Inpatient Hospital Stay: Payer: Medicare Other

## 2020-05-10 ENCOUNTER — Encounter: Payer: Self-pay | Admitting: *Deleted

## 2020-05-10 ENCOUNTER — Inpatient Hospital Stay: Payer: Medicare Other | Attending: Hematology & Oncology | Admitting: Hematology & Oncology

## 2020-05-10 NOTE — Progress Notes (Signed)
Patient was a no-show to his CT scan yesterday, and no-showed to his MD appointment today. Reviewed chart and saw that there was an attempt to reschedule the December appointments to January, but no confirmatory contact could be made.   Called patient today and left message asking patient to call back to reschedule missed appointments.   Oncology Nurse Navigator Documentation  Oncology Nurse Navigator Flowsheets 05/10/2020  Abnormal Finding Date -  Confirmed Diagnosis Date -  Planned Course of Treatment -  Phase of Treatment -  Chemotherapy Pending- Reason: -  Chemotherapy Actual Start Date: -  Navigator Follow Up Date: -  Navigator Follow Up Reason: -  Navigator Location [facility]  Referral Date to RadOnc/MedOnc -  Navigator Encounter Type Appt/Treatment Plan Review;Telephone  Telephone Outgoing Call  Treatment Initiated Date -  Patient Visit Type -  Treatment Phase -  Barriers/Navigation Needs -  Education -  Interventions -  Acuity -  Coordination of Care -  Education Method -  Support Groups/Services -  Time Spent with Patient 15

## 2020-05-15 ENCOUNTER — Telehealth: Payer: Self-pay

## 2020-05-15 NOTE — Telephone Encounter (Signed)
Called and left a vm to please give [place] a call to r/s his missed lab, scan and md visit.... AOM

## 2020-05-24 ENCOUNTER — Encounter: Payer: Self-pay | Admitting: *Deleted

## 2020-05-24 NOTE — Progress Notes (Signed)
Left a message requesting patient call the office to reschedule missed CT scans and appointment from early December.  Oncology Nurse Navigator Documentation  Oncology Nurse Navigator Flowsheets 05/24/2020  Abnormal Finding Date -  Confirmed Diagnosis Date -  Planned Course of Treatment -  Phase of Treatment -  Chemotherapy Pending- Reason: -  Chemotherapy Actual Start Date: -  Navigator Follow Up Date: -  Navigator Follow Up Reason: -  Navigator Location [facility]  Referral Date to RadOnc/MedOnc -  Navigator Encounter Type Appt/Treatment Plan Review;Telephone  Telephone Outgoing Call  Treatment Initiated Date -  Patient Visit Type -  Treatment Phase -  Barriers/Navigation Needs -  Education -  Interventions -  Acuity -  Coordination of Care -  Education Method -  Support Groups/Services -  Time Spent with Patient 15
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
## 2020-06-08 ENCOUNTER — Telehealth: Payer: Self-pay | Admitting: Interventional Cardiology

## 2020-06-08 ENCOUNTER — Ambulatory Visit: Payer: Medicare Other | Admitting: Interventional Cardiology

## 2020-06-08 DIAGNOSIS — I059 Rheumatic mitral valve disease, unspecified: Secondary | ICD-10-CM

## 2020-06-08 DIAGNOSIS — I428 Other cardiomyopathies: Secondary | ICD-10-CM

## 2020-06-08 DIAGNOSIS — I5022 Chronic systolic (congestive) heart failure: Secondary | ICD-10-CM

## 2020-06-08 DIAGNOSIS — Z7901 Long term (current) use of anticoagulants: Secondary | ICD-10-CM

## 2020-06-08 DIAGNOSIS — I4821 Permanent atrial fibrillation: Secondary | ICD-10-CM

## 2020-06-08 NOTE — Telephone Encounter (Signed)
Pt c/o medication issue:  1. Name of Medication:  ELIQUIS 5 MG TABS tablet ENTRESTO 24-26 MG  2. How are you currently taking this medication (dosage and times per day)? 1 tablet twice a day   3. Are you having a reaction (difficulty breathing--STAT)? no  4. What is your medication issue? Patient states he is in the donut hole and wants to know if he can be on generic medications. He states he was given paper work to fill out for the medication, but they wanted his w2 form and he did not want to do that. He states the medications work well for him, but they are too expensive.

## 2020-06-12 NOTE — Telephone Encounter (Signed)
Called and left message for patient to call back.

## 2020-06-21 ENCOUNTER — Other Ambulatory Visit: Payer: Self-pay | Admitting: Interventional Cardiology

## 2020-06-21 NOTE — Telephone Encounter (Signed)
Prescription refill request for Eliquis received.  Indication: Afib Last office visit: [place], 10/20/2019 Scr: 0.79, 04/06/2020 Age: 70 yo Weight: 108.6 kg   Prescription refill sent.

## 2020-07-04 ENCOUNTER — Encounter: Payer: Self-pay | Admitting: *Deleted

## 2020-07-04 NOTE — Progress Notes (Signed)
Patient has been unreachable by phone, and has not returned calls to reschedule his missed scans and appointment in December. Letter will be sent to patients home.   Oncology Nurse Navigator Documentation  Oncology Nurse Navigator Flowsheets 07/04/2020  Abnormal Finding Date -  Confirmed Diagnosis Date -  Planned Course of Treatment -  Phase of Treatment -  Chemotherapy Pending- Reason: -  Chemotherapy Actual Start Date: -  Navigator Follow Up Date: -  Navigator Follow Up Reason: -  Navigator Location [facility]  Referral Date to RadOnc/MedOnc -  Navigator Encounter Type Appt/Treatment Plan Review;Letter/Fax/Email  Telephone -  Treatment Initiated Date -  Patient Visit Type MedOnc  Treatment Phase Active Tx  Barriers/Navigation Needs Anxiety;Pain;Work Conflicts;Coordination of Care  Education -  Interventions Coordination of Care  Acuity Level 2-Minimal Needs (1-2 Barriers Identified)  Coordination of Care Appts  Education Method -  Support Groups/Services Friends and Family  Time Spent with Patient 30

## 2020-07-17 NOTE — Telephone Encounter (Signed)
Left message to call back  

## 2020-07-27 NOTE — Telephone Encounter (Signed)
Left another message for patient to call back. Patient has appt on 3/4.
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
## 2020-08-03 ENCOUNTER — Ambulatory Visit: Payer: Medicare PPO | Admitting: Interventional Cardiology

## 2020-08-03 ENCOUNTER — Other Ambulatory Visit: Payer: Self-pay

## 2020-08-03 ENCOUNTER — Encounter: Payer: Self-pay | Admitting: Interventional Cardiology

## 2020-08-03 VITALS — BP 134/90 | HR 92 | Ht 71.0 in | Wt 243.4 lb

## 2020-08-03 DIAGNOSIS — I10 Essential (primary) hypertension: Secondary | ICD-10-CM

## 2020-08-03 DIAGNOSIS — I428 Other cardiomyopathies: Secondary | ICD-10-CM | POA: Diagnosis not present

## 2020-08-03 DIAGNOSIS — I5022 Chronic systolic (congestive) heart failure: Secondary | ICD-10-CM

## 2020-08-03 DIAGNOSIS — Z7901 Long term (current) use of anticoagulants: Secondary | ICD-10-CM | POA: Diagnosis not present

## 2020-08-03 DIAGNOSIS — E785 Hyperlipidemia, unspecified: Secondary | ICD-10-CM

## 2020-08-03 DIAGNOSIS — I4821 Permanent atrial fibrillation: Secondary | ICD-10-CM

## 2020-08-03 MED ORDER — SILDENAFIL CITRATE 100 MG PO TABS: 50.0000 mg | ORAL_TABLET | Freq: Every day | ORAL | 3 refills | Status: AC | PRN

## 2020-08-03 MED ORDER — IRBESARTAN 150 MG PO TABS: 150.0000 mg | ORAL_TABLET | Freq: Every day | ORAL | 3 refills | Status: DC

## 2020-08-03 NOTE — Patient Instructions (Signed)
Medication Instructions:  Your physician has recommended you make the following change in your medication: [name] current supply of Entresto then stop.  Once Entresto stopped start Irbesartan 150 mg daily.  *If you need a refill on your cardiac medications before your next appointment, please call your pharmacy*   Lab Work: Your physician recommends that you return for lab work on April 1,2022.  BMP.  This is not fasting.  The lab is open from 7:30 AM to 4:15 PM  If you have labs (blood work) drawn today and your tests are completely normal, you will receive your results only by: Marland Kitchen MyChart Message (if you have MyChart) OR . A paper copy in the mail If you have any lab test that is abnormal or we need to change your treatment, we will call you to review the results.   Testing/Procedures: none   Follow-Up: At [facility], you and your health needs are our priority.  As part of our continuing mission to provide you with exceptional heart care, we have created designated Provider Care Teams.  These Care Teams include your primary Cardiologist (physician) and Advanced Practice Providers (APPs -  Physician Assistants and Nurse Practitioners) who all work together to provide you with the care you need, when you need it.  We recommend signing up for the patient portal called "MyChart".  Sign up information is provided on this After Visit Summary.  MyChart is used to connect with patients for Virtual Visits (Telemedicine).  Patients are able to view lab/test results, encounter notes, upcoming appointments, etc.  Non-urgent messages can be sent to your provider as well.   To learn more about what you can do with MyChart, go to [email or web address].    Your next appointment:   6 month(s)  The format for your next appointment:   In Person  Provider:   You may see Larae Grooms, MD or one of the following Advanced Practice Providers on your designated Care Team:    Melina Copa,  PA-C  Ermalinda Barrios, PA-C    Other Instructions

## 2020-08-03 NOTE — Telephone Encounter (Signed)
Patient saw Dr Varanasi today ?

## 2020-08-31 ENCOUNTER — Other Ambulatory Visit: Payer: Medicare PPO

## 2020-09-05 ENCOUNTER — Telehealth: Payer: Self-pay | Admitting: Interventional Cardiology

## 2020-09-05 NOTE — Telephone Encounter (Signed)
I spoke with patient.  He has been out of Eliquis for 2 weeks.  I told him I would leave samples of Eliquis and assistance program information at front desk for him.  Patient is aware of assistance program but has not completed paperwork in the past as he does not want to send income information in. Patient has also been out of Entresto for 2 weeks. He was to change to Irbesartan when he finished current supply of Entresto.  He states pharmacy did not have prescription when he went to pick it up.  I told patient I would check with pharmacy and make sure they received prescription.  I confirmed pharmacy is [facility] on [address] in [place].  Patient is also asking if Viagra prescription was sent. I spoke with pharmacy and confirmed they received prescriptions for Irbesartan and Viagra and will get ready for patient. 2 boxes of Eliquis 5 mg left at front desk for patient to pick up. Patient reports he has been having some shortness of breath since being out of his medications.  He states this always happens when he is out of his medicine.  He will let us know if shortness of breath continues.

## 2020-09-05 NOTE — Telephone Encounter (Signed)
Patient calling the office for samples of medication:   1.  What medication and dosage are you requesting samples for? Eliquis  2.  Are you currently out of this medication? Yes- been out of it for 2 weeks

## 2020-10-24 ENCOUNTER — Other Ambulatory Visit: Payer: Self-pay | Admitting: Interventional Cardiology

## 2020-10-24 MED ORDER — ATORVASTATIN CALCIUM 40 MG PO TABS: 1.0000 | ORAL_TABLET | Freq: Every day | ORAL | 3 refills | Status: DC

## 2020-10-24 MED ORDER — AMLODIPINE BESYLATE 5 MG PO TABS: 1.0000 | ORAL_TABLET | Freq: Every day | ORAL | 3 refills | Status: DC

## 2020-10-24 MED ORDER — ELIQUIS 5 MG PO TABS
1.0000 | ORAL_TABLET | Freq: Two times a day (BID) | ORAL | 1 refills | Status: DC
Start: 2020-10-24 — End: 2021-06-24

## 2020-10-24 MED ORDER — METOPROLOL TARTRATE 100 MG PO TABS
100.0000 mg | ORAL_TABLET | Freq: Two times a day (BID) | ORAL | 3 refills | Status: DC
Start: 2020-10-24 — End: 2021-08-05

## 2020-10-24 MED ORDER — FUROSEMIDE 40 MG PO TABS
40.0000 mg | ORAL_TABLET | Freq: Every day | ORAL | 3 refills | Status: DC
Start: 2020-10-24 — End: 2020-12-21

## 2020-10-24 NOTE — Telephone Encounter (Signed)
Pt's medications were sent to pt's pharmacy as requested. Confirmation received.  

## 2020-10-24 NOTE — Telephone Encounter (Signed)
*  STAT* If patient is at the pharmacy, call can be transferred to refill team.   1. Which medications need to be refilled? (please list name of each medication and dose if known)  atorvastatin (LIPITOR) 40 MG tablet amLODipine (NORVASC) 5 MG tablet ELIQUIS 5 MG TABS tablet furosemide (LASIX) 40 MG tablet metoprolol tartrate (LOPRESSOR) 100 MG tablet   2. Which pharmacy/location (including street and city if local pharmacy) is medication to be sent to? [place], [place]  3. Do they need a 30 day or 90 day supply? 90  Patient does not use other pharmacy. All meds need to be sent to the [facility]

## 2020-10-24 NOTE — Telephone Encounter (Signed)
Prescription refill request for Eliquis received. Indication: afib  Last office visit:Varanasi, 08/03/2020 Scr: 0.79, 04/06/2020 Age: 70 yo  Weight: 110.4 kg   Pt is on the correct dose of Eliquis per dosing criteria, prescription refill sent for Eliquis 5mg  bid.

## 2020-11-05 ENCOUNTER — Telehealth: Payer: Self-pay | Admitting: Interventional Cardiology

## 2020-11-05 NOTE — Telephone Encounter (Signed)
Pt c/o swelling: STAT is pt has developed SOB within 24 hours  1) How much weight have you gained and in what time span?  Patient assumes he has gained weight, but he states his weight always fluctuates.  2) If swelling, where is the swelling located?  Ankles   3) Are you currently taking a fluid pill? Yes, furosemide (LASIX) 40 MG tablet  4) Are you currently SOB?  Not, currently but patient states he became SOB last night and again this morning   5) Do you have a log of your daily weights (if so, list)? No   6) Have you gained 3 pounds in a day or 5 pounds in a week? No   7) Have you traveled recently? No    Pt c/o Shortness Of Breath: STAT if SOB developed within the last 24 hours or pt is noticeably SOB on the phone  1. Are you currently SOB (can you hear that pt is SOB on the phone)?  No   2. How long have you been experiencing SOB?  Patient states the SOB developed last night, but he is recovering from [place], so he assumes it's from having COVID. He states he has also been off Eliquis for 2 months so he assumes this may be causing the SOB as well.  3. Are you SOB when sitting or when up moving around?  When sitting down  4. Are you currently experiencing any other symptoms? Ankles swelling

## 2020-11-05 NOTE — Telephone Encounter (Signed)
Spoke with patient about shortness of breath.  Patient has recently had COVID 19 and was supposed to go back to work today but he reports his ankles are swollen.  Swelling is down in the mornings but builds up over the day.  Patient unable to lay down and sleep because of shortness of breath.  Patient says his weight fluctuates but can say how much. Advised patient to elevate extremities, wear compression stockings and take an extra lasix today and I would route message to Dr. Irish Lack and Fraser Din for further recommendations.  Advised patient if breathing worsens to go to the Emergency Room.  Patient verbalized understanding.

## 2020-11-05 NOTE — Telephone Encounter (Signed)
Ok to take Lasix 40 mg BID for 3 days.  Elevate legs and use compression stockings.  Did he take oral antiviral for COVID?

## 2020-11-06 NOTE — Telephone Encounter (Signed)
Left message to call office

## 2020-11-12 NOTE — Telephone Encounter (Signed)
Dr. 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  Pt states during that time he had breathing and swelling issues.  Pt states he went into work today for he now has a negative covid test and is feeling much better, and his new office manager sent him home advising him that he would have to obtain a Doctors note stating why he was out for the 3 weeks with covid, and that he is now clear to return to work doing his normal work duties.    Pt states he would like the note asap, for he states he needs to return back to work tomorrow, due to financial issues from being out with covid.  Did inform the pt that you and Fraser Din RN were out of the office today, but I would certainly send this message to you to further advise on, and triage will follow-up with him accordingly thereafter.  Pt agreed to this plan.

## 2020-11-12 NOTE — Telephone Encounter (Signed)
Patient is following up. He stated he is feeling better and his swelling has gone down tremendously. He states today he returned to work after being out several weeks due to SOB/swelling and COVID. 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 He states he tried explaining his symptoms that developed post-COVID, but his employer his requiring documentation. He is requesting to have a detailed note faxed to his employer at fax # [phone] (ATTN: Mattie Marlin). Please return call to patient with confirmation.

## 2020-11-14 NOTE — Telephone Encounter (Signed)
OK to write a work note that he was out with prolonged COVID symptoms.  Now he can return to work without restrictions.   JV   I placed call to patient and left message to call office

## 2020-11-22 NOTE — Telephone Encounter (Signed)
Left message to call office

## 2020-11-27 ENCOUNTER — Telehealth: Payer: Self-pay | Admitting: Interventional Cardiology

## 2020-11-27 DIAGNOSIS — I428 Other cardiomyopathies: Secondary | ICD-10-CM

## 2020-11-27 DIAGNOSIS — I5022 Chronic systolic (congestive) heart failure: Secondary | ICD-10-CM

## 2020-11-27 NOTE — Telephone Encounter (Signed)
Spoke with the patient who states that he has continued to have shortness of breath. He states that this started after he stopped taking Entresto. Patient denies any swelling or chest pain.  Patient confirms he is taking Lasix 40 mg daily. He reports shortness of breath is worse when lying down.  He states that he is concerned because he normally works 4 10 hour shifts per week and occasional overtime for 8 hours on Fridays. He states that his work informed them that they are going to have to start working on Saturdays. The patient states that he is not able to work that many hours per week due to his condition. He states that he would like a letter from Dr. Irish Lack stating that he can only work 4 10 hours shifts per week and occasional Fridays but that he cannot work on Saturdays. Advised patient that I would send a note to Dr. Irish Lack and his nurse for further advisement regarding a note.

## 2020-11-27 NOTE — Telephone Encounter (Signed)
Pt c/o Shortness Of Breath: STAT if SOB developed within the last 24 hours or pt is noticeably SOB on the phone  1. Are you currently SOB (can you hear that pt is SOB on the phone)? No , It comes and goes   2. How long have you been experiencing SOB? Pt stated this just started when his heart meds where changed   3. Are you SOB when sitting or when up moving around? It mostly when he sits down or lays down.  He stated he has to "catch up with his breath" when he is up moving around   4. Are you currently experiencing any other symptoms? Pt denies any other symptoms.    Pt stated that his work has told him that he is going to have to work Friday and Saturday starting after July.  He stated he is needing a note from [place] that he can not push his heart that much and in this heat.  He is good with the 10 hours 4 days a week? He stated by Friday he is wore out.  He would like to know if he could include Friday being optional?    Also he stated he wanted Dr Irish Lack to know that he had Covid 2 times within 4 months.  He was off his meds at this time.    Pt stated if he does not answer the 1st time try again.    Best number  [phone]

## 2020-11-29 ENCOUNTER — Encounter: Payer: Self-pay | Admitting: *Deleted

## 2020-11-29 NOTE — Telephone Encounter (Signed)
I think cost was the issue with Entresto.  Can we get a referral to PharmD for CHF med titration.    I am ok with giving him a letter saying he can limit work 40 hours a week due to nonischemic cardiomyopathy, but we cannot specify which days of the week it can be.  No restrictions while at work.   JV   Patient notified.  Appointment made for patient to see pharmacist on July 22 at 2:30. Patient has been previously cleared to return to work but letter was not sent as we had not been able to reach him to discuss.  Patient reports he has been working 48 hours per week. His concern at this time is working 16 hours extra in addition to this.  He requests letter be written for no more than 48 hours per week.  Patient will stop by office to pick up letter.  Letter left at front desk for patient to pick up

## 2020-12-21 ENCOUNTER — Ambulatory Visit (INDEPENDENT_AMBULATORY_CARE_PROVIDER_SITE_OTHER): Payer: Medicare PPO | Admitting: Pharmacist

## 2020-12-21 ENCOUNTER — Other Ambulatory Visit: Payer: Self-pay

## 2020-12-21 VITALS — BP 130/70 | HR 67

## 2020-12-21 DIAGNOSIS — I5022 Chronic systolic (congestive) heart failure: Secondary | ICD-10-CM | POA: Diagnosis not present

## 2020-12-21 MED ORDER — SPIRONOLACTONE 25 MG PO TABS: 12.5000 mg | ORAL_TABLET | Freq: Every day | ORAL | 3 refills | Status: DC

## 2020-12-21 MED ORDER — FUROSEMIDE 40 MG PO TABS
40.0000 mg | ORAL_TABLET | Freq: Two times a day (BID) | ORAL | 3 refills | Status: DC
Start: 2020-12-21 — End: 2020-12-24

## 2020-12-21 NOTE — Patient Instructions (Signed)
Start taking spironolactone 12.'5mg'$  daily  Continue irbesartan '150mg'$  daily, metoprolol '100mg'$  twice a day and furosemide '40mg'$  twice a day
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
## 2020-12-22 LAB — BASIC METABOLIC PANEL
BUN/Creatinine Ratio: 28 — ABNORMAL HIGH (ref 10–24)
BUN: 18 mg/dL (ref 8–27)
CO2: 24 mmol/L (ref 20–29)
Calcium: 8.7 mg/dL (ref 8.6–10.2)
Chloride: 103 mmol/L (ref 96–106)
Creatinine, Ser: 0.64 mg/dL — ABNORMAL LOW (ref 0.76–1.27)
Glucose: 198 mg/dL — ABNORMAL HIGH (ref 65–99)
Potassium: 3.7 mmol/L (ref 3.5–5.2)
Sodium: 142 mmol/L (ref 134–144)
eGFR: 104 mL/min/{1.73_m2} (ref >59)

## 2020-12-22 LAB — PRO B NATRIURETIC PEPTIDE: NT-Pro BNP: 636 pg/mL — ABNORMAL HIGH (ref 0–376)

## 2020-12-24 ENCOUNTER — Telehealth: Payer: Self-pay | Admitting: Interventional Cardiology

## 2020-12-24 ENCOUNTER — Other Ambulatory Visit: Payer: Self-pay | Admitting: *Deleted

## 2020-12-24 MED ORDER — FUROSEMIDE 40 MG PO TABS: 40.0000 mg | ORAL_TABLET | Freq: Two times a day (BID) | ORAL | 0 refills | Status: DC

## 2020-12-24 NOTE — Telephone Encounter (Signed)
Left message to call back.  Also see refill phone call from today

## 2020-12-24 NOTE — Telephone Encounter (Signed)
S/w pt talked about issue with Shortness of Breath, pt cannot lay down at night.  Pt stated ever since tier 4 medication has been discontinue pt has had problems, could not state Tier 4 medication.  Also stated in the donut hole, the expense of medications, and since pt has SOB needs to be seen sooner.  Will send lasix ( 40 mg) bid # 30 to local pharmacy due to new script was sent to mail order.  Stated to pt will send to Enis Slipper, RN, Dr. Hassell Done nurse to advise.

## 2020-12-24 NOTE — Telephone Encounter (Signed)
Patient Is calling back because he has no more meds and the ordered hasnt be fill yet. Patient is at the pharmacy now. Please advise

## 2020-12-24 NOTE — Telephone Encounter (Signed)
Jettie Booze, MD  12/23/2020  6:36 PM EDT Back to Top     Stable renal function. Contine Lasix and spironolactone.  Minimize salt and processed foods.  Can take extra Lasix 40 in AM for 2 days.  80 mg in AM, 40 mgin PM for 2 days, then return to 40 mg BID.   Left detailed message with above.  Requested call back to confirm Pt had received.

## 2020-12-24 NOTE — Telephone Encounter (Signed)
Pt c/o Shortness Of Breath: STAT if SOB developed within the last 24 hours or pt is noticeably SOB on the phone  1. Are you currently SOB (can you hear that pt is SOB on the phone)?  No right now  2. How long have you been experiencing SOB?  Ongoing issue  3. Are you SOB when sitting or when up moving around? All the time  4. Are you currently experiencing any other symptoms? anxiety

## 2020-12-24 NOTE — Telephone Encounter (Signed)
Rx(s) sent to pharmacy electronically.  Left message for patient informing him that we sent a 2 week supply of his medication to the local pharmacy. Advised a call back with any questions or concerns.

## 2020-12-24 NOTE — Telephone Encounter (Signed)
*  STAT* If patient is at the pharmacy, call can be transferred to refill team.   1. Which medications need to be refilled? (please list name of each medication and dose if known)  furosemide (LASIX) 40 MG tablet  2. Which pharmacy/location (including street and city if local pharmacy) is medication to be sent to? [facility]  [address] [phone]   3. Do they need a 30 day or 90 day supply? 30 day supply A script has already been sent to [facility] order for 90 days supply

## 2020-12-24 NOTE — Telephone Encounter (Signed)
Left message to call back  

## 2020-12-25 ENCOUNTER — Telehealth: Payer: Self-pay | Admitting: Interventional Cardiology

## 2020-12-25 DIAGNOSIS — R0600 Dyspnea, unspecified: Secondary | ICD-10-CM

## 2020-12-25 NOTE — Telephone Encounter (Signed)
Pt calling to discuss his continued SOB. He states he was doing well while on Entresto but cannot afford it. He will not fill out the patient assistance form because "its too much."  He had been spreading his lasix out to '40mg'$  tid because he "doesn't want to be in the bathroom." He states he is SOB and cannot lay flat causing him issues with sleep. He wants to see Dr. Irish Lack asap because "he's gotta do something."  We reviewed Dr. Hassell Done recommendation of increasing lasix to '80mg'$  AM and '40mg'$  PM versus tid. I advised him I would forward to Dr. Irish Lack and his RN for review, recommendation and follow up. He would like to be seen in the office.

## 2020-12-25 NOTE — Telephone Encounter (Signed)
Pt c/o Shortness Of Breath: STAT if SOB developed within the last 24 hours or pt is noticeably SOB on the phone  1. Are you currently SOB (can you hear that pt is SOB on the phone)? no  2. How long have you been experiencing SOB? About a month  3. Are you SOB when sitting or when up moving around? Can happen at any time. He said he  gets SOB occasionally when he lays down to sleep.  4. Are you currently experiencing any other symptoms?   Patient states that he was on a Tier 4 medication but it was d/c due to cost. When his other medication was stopped was when he started having the SOB.  The patient states that his appointment with the pharmacist was a "waste of time"    Patient also states he is taking 3 doses of a water pill now (7:00 am, Noon, 3:00 pm) and that seems to help with the fluid around his heart.  The patient is at work and is not supposed to have his phone near him. He asks that if we don't get him on the first try that the office try again in approx. 5 minutes

## 2020-12-25 NOTE — Telephone Encounter (Signed)
   Check BMet and BNP.  Check echo.  He had some mitral regurg noted in the past.  If this has gotten worse, then treatment may change.  Jettie Booze, MD

## 2020-12-26 NOTE — Telephone Encounter (Signed)
Left message for patient to call back. Placed orders for lab work and echo, so when patient calls back he can go ahead and schedule test.

## 2020-12-27 NOTE — Telephone Encounter (Signed)
See 7/26 phone note.

## 2021-01-02 NOTE — Telephone Encounter (Signed)
Echo has been scheduled for August 16,2022.  I spoke to patient to see if he would like to have lab work done sooner or same day as echo.  He would like to have lab work same day as echo.

## 2021-01-15 ENCOUNTER — Other Ambulatory Visit: Payer: Medicare PPO | Admitting: *Deleted

## 2021-01-15 ENCOUNTER — Ambulatory Visit (HOSPITAL_COMMUNITY): Payer: Medicare PPO | Attending: Cardiovascular Disease

## 2021-01-15 ENCOUNTER — Encounter (HOSPITAL_COMMUNITY): Payer: Self-pay | Admitting: Interventional Cardiology

## 2021-01-15 ENCOUNTER — Other Ambulatory Visit: Payer: Self-pay

## 2021-01-15 DIAGNOSIS — R0600 Dyspnea, unspecified: Secondary | ICD-10-CM

## 2021-01-16 LAB — BASIC METABOLIC PANEL
BUN/Creatinine Ratio: 20 (ref 10–24)
BUN: 19 mg/dL (ref 8–27)
CO2: 22 mmol/L (ref 20–29)
Calcium: 9.2 mg/dL (ref 8.6–10.2)
Chloride: 104 mmol/L (ref 96–106)
Creatinine, Ser: 0.93 mg/dL (ref 0.76–1.27)
Glucose: 101 mg/dL — ABNORMAL HIGH (ref 65–99)
Potassium: 3.9 mmol/L (ref 3.5–5.2)
Sodium: 141 mmol/L (ref 134–144)
eGFR: 90 mL/min/{1.73_m2} (ref >59)

## 2021-01-16 LAB — PRO B NATRIURETIC PEPTIDE: NT-Pro BNP: 588 pg/mL — ABNORMAL HIGH (ref 0–376)

## 2021-01-21 ENCOUNTER — Telehealth: Payer: Self-pay | Admitting: Pharmacist

## 2021-01-21 DIAGNOSIS — I428 Other cardiomyopathies: Secondary | ICD-10-CM

## 2021-01-21 NOTE — Telephone Encounter (Signed)
-----   Message from Jettie Booze, MD sent at 01/17/2021 11:02 PM EDT ----- BNP better and BMet stable. Can we go up on Spironolactone maybe?

## 2021-01-21 NOTE — Telephone Encounter (Signed)
Called pt to discussed increasing spironolactone. LVM for patient to call back.

## 2021-02-01 MED ORDER — SPIRONOLACTONE 25 MG PO TABS: 25.0000 mg | ORAL_TABLET | Freq: Every day | ORAL | 3 refills | Status: DC

## 2021-02-01 NOTE — Telephone Encounter (Signed)
States that since he got back on his medicine and is taking regularly his SOB is gone. He is agreeable to increasing spironolactone to '25mg'$  daily. He will get lab work done when he is here for echo on 9/8

## 2021-02-07 ENCOUNTER — Other Ambulatory Visit: Payer: Medicare PPO | Admitting: *Deleted

## 2021-02-07 ENCOUNTER — Other Ambulatory Visit: Payer: Self-pay

## 2021-02-07 ENCOUNTER — Ambulatory Visit (HOSPITAL_COMMUNITY): Payer: Medicare PPO | Attending: Interventional Cardiology

## 2021-02-07 DIAGNOSIS — I428 Other cardiomyopathies: Secondary | ICD-10-CM

## 2021-02-07 DIAGNOSIS — R0600 Dyspnea, unspecified: Secondary | ICD-10-CM | POA: Diagnosis not present

## 2021-02-08 ENCOUNTER — Telehealth: Payer: Self-pay | Admitting: Pharmacist

## 2021-02-08 LAB — BASIC METABOLIC PANEL
BUN/Creatinine Ratio: 26 — ABNORMAL HIGH (ref 10–24)
BUN: 21 mg/dL (ref 8–27)
CO2: 20 mmol/L (ref 20–29)
Calcium: 9.6 mg/dL (ref 8.6–10.2)
Chloride: 102 mmol/L (ref 96–106)
Creatinine, Ser: 0.8 mg/dL (ref 0.76–1.27)
Glucose: 147 mg/dL — ABNORMAL HIGH (ref 65–99)
Potassium: 4.1 mmol/L (ref 3.5–5.2)
Sodium: 141 mmol/L (ref 134–144)
eGFR: 97 mL/min/{1.73_m2} (ref >59)

## 2021-02-08 NOTE — Telephone Encounter (Signed)
BMP stable after increasing spironolactone '25mg'$  daily. Spoke with patient and reviewed results. Continue spironolactone '25mg'$  daily, irbesartan '150mg'$  daily, furosemide '40mg'$  BID and metoprolol '100mg'$  BID. Patient reports that he doesn't check his blood pressure He is feeling much better

## 2021-02-11 LAB — ECHOCARDIOGRAM COMPLETE
AR max vel: 1.36 cm2
AV Area VTI: 1.37 cm2
AV Area mean vel: 1.33 cm2
AV Mean grad: 6.3 mmHg
AV Peak grad: 12 mmHg
Ao pk vel: 1.73 m/s
Area-P 1/2: 3.01 cm2
MV M vel: 5.02 m/s
MV Peak grad: 100.6 mmHg
P 1/2 time: 349 msec
Radius: 1.1 cm
S' Lateral: 5.1 cm

## 2021-02-19 ENCOUNTER — Telehealth: Payer: Self-pay | Admitting: *Deleted

## 2021-02-19 NOTE — Telephone Encounter (Signed)
-----   Message from Jettie Booze, MD sent at 02/12/2021  3:16 PM EDT ----- We should set him up for left and right heart cath.  I will call him. ----- Message ----- From: Sherren Mocha, MD Sent: 02/12/2021  11:00 AM EDT To: Jettie Booze, MD, #  [name] - his MR is bad. I would think about moving forward with treating him. LV is big and I'm afraid he will continue to dilate and be a poor candidate later on. I would probably set him up for cath and TEE with one of the structural imagers. I'm happy to see him if you would like. Probably will be a good Clip candidate.   Ronalee Belts ----- Message ----- From: Jettie Booze, MD Sent: 02/11/2021  11:13 AM EDT To: Thompson Grayer, RN, Sherren Mocha, MD  LVEF has improved from prior, up to 40-45%.  Severe mitral regurgitation, more prominent than before based on report.  If his shortness of breath persists, will have to plan for L/R heart cath to see if the valve needs to be repaired.    Ronalee Belts, if you look at his 2020 echo result, we had a brief exchange about his mitral regurgitation.  He has had issues affording heart failure medications, and his symptoms have directly correlated to when he can get his medications.  We will see if his symptoms become refractory and he may need some valve intervention.

## 2021-02-19 NOTE — Telephone Encounter (Signed)
Dr Irish Lack was unable to reach patient.  I spoke with patient and reviewed echo results with him.  He reports his heart has been doing very well on new medication.  He has been having issues with decreased appetite and weight loss.  Vomited last week for 2 days and now having nausea.  He is seeing PCP later today.  I scheduled patient to see Dr Irish Lack on 02/28/21 at 10:00 to review echo and arrange tests.
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
## 2021-02-28 ENCOUNTER — Other Ambulatory Visit: Payer: Self-pay

## 2021-02-28 ENCOUNTER — Encounter: Payer: Self-pay | Admitting: Interventional Cardiology

## 2021-02-28 ENCOUNTER — Telehealth: Payer: Self-pay | Admitting: *Deleted

## 2021-02-28 ENCOUNTER — Ambulatory Visit: Payer: Medicare PPO | Admitting: Interventional Cardiology

## 2021-02-28 VITALS — BP 144/72 | HR 104 | Ht 71.0 in | Wt 235.0 lb

## 2021-02-28 DIAGNOSIS — I4821 Permanent atrial fibrillation: Secondary | ICD-10-CM | POA: Diagnosis not present

## 2021-02-28 DIAGNOSIS — I340 Nonrheumatic mitral (valve) insufficiency: Secondary | ICD-10-CM

## 2021-02-28 DIAGNOSIS — I428 Other cardiomyopathies: Secondary | ICD-10-CM | POA: Diagnosis not present

## 2021-02-28 DIAGNOSIS — I5022 Chronic systolic (congestive) heart failure: Secondary | ICD-10-CM | POA: Diagnosis not present

## 2021-02-28 DIAGNOSIS — Z7901 Long term (current) use of anticoagulants: Secondary | ICD-10-CM

## 2021-02-28 DIAGNOSIS — E782 Mixed hyperlipidemia: Secondary | ICD-10-CM

## 2021-02-28 LAB — COMPREHENSIVE METABOLIC PANEL
ALT: 21 IU/L (ref 0–44)
AST: 26 IU/L (ref 0–40)
Albumin/Globulin Ratio: 1.8 (ref 1.2–2.2)
Albumin: 4.3 g/dL (ref 3.8–4.8)
Alkaline Phosphatase: 114 IU/L (ref 44–121)
BUN/Creatinine Ratio: 22 (ref 10–24)
BUN: 18 mg/dL (ref 8–27)
Bilirubin Total: 0.7 mg/dL (ref 0.0–1.2)
CO2: 22 mmol/L (ref 20–29)
Calcium: 9.4 mg/dL (ref 8.6–10.2)
Chloride: 102 mmol/L (ref 96–106)
Creatinine, Ser: 0.82 mg/dL (ref 0.76–1.27)
Globulin, Total: 2.4 g/dL (ref 1.5–4.5)
Glucose: 99 mg/dL (ref 70–99)
Potassium: 4.2 mmol/L (ref 3.5–5.2)
Sodium: 141 mmol/L (ref 134–144)
Total Protein: 6.7 g/dL (ref 6.0–8.5)
eGFR: 96 mL/min/{1.73_m2} (ref >59)

## 2021-02-28 LAB — LIPID PANEL
Chol/HDL Ratio: 5 ratio (ref 0.0–5.0)
Cholesterol, Total: 135 mg/dL (ref 100–199)
HDL: 27 mg/dL — ABNORMAL LOW (ref >39)
LDL Chol Calc (NIH): 55 mg/dL (ref 0–99)
Triglycerides: 344 mg/dL — ABNORMAL HIGH (ref 0–149)
VLDL Cholesterol Cal: 53 mg/dL — ABNORMAL HIGH (ref 5–40)

## 2021-02-28 LAB — CBC
Hematocrit: 44.9 % (ref 37.5–51.0)
Hemoglobin: 15.7 g/dL (ref 13.0–17.7)
MCH: 30.5 pg (ref 26.6–33.0)
MCHC: 35 g/dL (ref 31.5–35.7)
MCV: 87 fL (ref 79–97)
Platelets: 155 10*3/uL (ref 150–450)
RBC: 5.15 x10E6/uL (ref 4.14–5.80)
RDW: 12.8 % (ref 11.6–15.4)
WBC: 9.2 10*3/uL (ref 3.4–10.8)

## 2021-02-28 NOTE — Telephone Encounter (Signed)
I tried again to reach patient but mailbox was full.

## 2021-02-28 NOTE — Telephone Encounter (Signed)
Cath and TEE have been rescheduled to be done on same day.  Patient is now scheduled for TEE at 10:00 with Dr Margaretann Loveless and cath at noon with Dr Irish Lack on October 13.  I placed call to patient to update him.  Mailbox is full.

## 2021-02-28 NOTE — Patient Instructions (Addendum)
edication Instructions:  Your physician recommends that you continue on your current medications as directed. Please refer to the Current Medication list given to you today.  Samples of Eliquis given in office today  *If you need a refill on your cardiac medications before your next appointment, please call your pharmacy*   Lab Work: Lab work to be done today--CBC, CMET and lipids If you have labs (blood work) drawn today and your tests are completely normal, you will receive your results only by: [place] (if you have MyChart) OR A paper copy in the mail If you have any lab test that is abnormal or we need to change your treatment, we will call you to review the results.   Testing/Procedures: Your physician has requested that you have a cardiac catheterization. Cardiac catheterization is used to diagnose and/or treat various heart conditions. Doctors may recommend this procedure for a number of different reasons. The most common reason is to evaluate chest pain. Chest pain can be a symptom of coronary artery disease (CAD), and cardiac catheterization can show whether plaque is narrowing or blocking your heart's arteries. This procedure is also used to evaluate the valves, as well as measure the blood flow and oxygen levels in different parts of your heart. For further information please visit [email or web address]. Please follow instruction sheet, as given. Scheduled for October 6,2022  Your physician has requested that you have a TEE. During a TEE, sound waves are used to create images of your heart. It provides your doctor with information about the size and shape of your heart and how well your heart's chambers and valves are working. In this test, a transducer is attached to the end of a flexible tube that's guided down your throat and into your esophagus (the tube leading from you mouth to your stomach) to get a more detailed image of your heart. You are not awake for the procedure.  Please see the instruction sheet given to you today. For further information please visit [email or web address].  Scheduled for October 10,2022     Follow-Up: At [facility], you and your health needs are our priority.  As part of our continuing mission to provide you with exceptional heart care, we have created designated Provider Care Teams.  These Care Teams include your primary Cardiologist (physician) and Advanced Practice Providers (APPs -  Physician Assistants and Nurse Practitioners) who all work together to provide you with the care you need, when you need it.  We recommend signing up for the patient portal called "MyChart".  Sign up information is provided on this After Visit Summary.  MyChart is used to connect with patients for Virtual Visits (Telemedicine).  Patients are able to view lab/test results, encounter notes, upcoming appointments, etc.  Non-urgent messages can be sent to your provider as well.   To learn more about what you can do with MyChart, go to [email or web address].    Your next appointment:   Based on test results  The format for your next appointment:   In Person  Provider:   You may see Larae Grooms, MD or one of the following Advanced Practice Providers on your designated Care Team:   Melina Copa, PA-C Ermalinda Barrios, PA-C   Other Instructions   [place] [facility] OFFICE [place], [place] [address] Dept: [phone] Loc: [place]  02/28/2021  You are scheduled for a Cardiac Catheterization on Thursday, October 6 with  Dr. Larae Grooms.  1. Please arrive at the [facility] (Main Entrance A) at [facility]: [address] [place], [place] [address] at 5:30 AM (This time is two hours before your procedure to ensure your preparation). Free valet parking service is available.   Special note: Every effort is made to  have your procedure done on time. Please understand that emergencies sometimes delay scheduled procedures.  2. Diet: Do not eat solid foods after midnight.  The patient may have clear liquids until 5am upon the day of the procedure.  3. Labs: done in office on 02/28/21  4. Medication instructions in preparation for your procedure:   Contrast Allergy: No  Stop Eliquis after evening dose on October 3,2022  Do not take furosemide the morning of the procedure.  Do not take Viagra for 4 days prior to procedure   On the morning of your procedure, take your Aspirin 81 mg and any morning medicines NOT listed above.  You may use sips of water.  5. Plan for one night stay--bring personal belongings. 6. Bring a current list of your medications and current insurance cards. 7. You MUST have a responsible person to drive you home. 8. Someone MUST be with you the first 24 hours after you arrive home or your discharge will be delayed. 9. Please wear clothes that are easy to get on and off and wear slip-on shoes.  Thank you for allowing [place] to care for you!   -- [facility]     You are scheduled for a TEE on March 11, 2021 with Dr. Johnsie Cancel.  Please arrive at the [facility] (Main Entrance A) at [facility]: [address] [place], [place] [address] at 8:30 am    DIET: Nothing to eat or drink after midnight except a sip of water with medications (see medication instructions below)  FYI: For your safety, and to allow [place] to monitor your vital signs accurately during the surgery/procedure we request that   if you have artificial nails, gel coating, SNS etc. Please have those removed prior to your surgery/procedure. Not having the nail coverings /polish removed may result in cancellation or delay of your surgery/procedure.   Medication Instructions: Hold furosemide the morning of the procedure  Continue your anticoagulant: Eliquis   Labs: to be done at  the hospital day of procedure  You must have a responsible person to drive you home and stay in the waiting area during your procedure. Failure to do so could result in cancellation.  Bring your insurance cards.  *Special Note: Every effort is made to have your procedure done on time. Occasionally there are emergencies that occur at the hospital that may cause delays. Please be patient if a delay does occur.

## 2021-02-28 NOTE — Telephone Encounter (Signed)
Tried again to reach patient but mailbox is full.  

## 2021-03-01 NOTE — Telephone Encounter (Signed)
Tired to call patient again. Voicemail is full.

## 2021-03-04 NOTE — Telephone Encounter (Signed)
I spoke with patient and gave him update on date change.  He is aware he should now stop Eliquis after evening dose on October 10 and arrive at hospital at 8:30 on October 13.

## 2021-03-04 NOTE — Telephone Encounter (Signed)
Tried again to reach patient.  Per DPR it is OK to leave message on wife's voicemail.  Left message on wife's voicemail requesting patient call office.

## 2021-03-05 ENCOUNTER — Encounter (HOSPITAL_COMMUNITY): Payer: Self-pay | Admitting: Internal Medicine

## 2021-03-05 NOTE — Progress Notes (Signed)
Attempted to obtain medical history via telephone, unable to reach at this time. Unable to leave voicemail to return pre surgical testing department's phone call,due to mailbox full.  

## 2021-03-13 ENCOUNTER — Other Ambulatory Visit: Payer: Self-pay | Admitting: Interventional Cardiology

## 2021-03-13 ENCOUNTER — Telehealth: Payer: Self-pay | Admitting: *Deleted

## 2021-03-13 DIAGNOSIS — I340 Nonrheumatic mitral (valve) insufficiency: Secondary | ICD-10-CM

## 2021-03-13 NOTE — Telephone Encounter (Signed)
Patient is returning Anne's call.

## 2021-03-13 NOTE — Telephone Encounter (Signed)
[name] voicemail full, voicemail message at wife's number, unable to review instructions.

## 2021-03-13 NOTE — Telephone Encounter (Signed)
Cardiac catheterization scheduled at [facility] for: Thursday March 14, 2021 12 Noon/TEE [facility] Main Entrance A [facility]) at: 9 AM   Nothing to eat or drink after midnight, may have sips of water to take medications.  Medication instructions: Hold: Eliquis-none 03/12/21 until post procedure Spironolactone-AM of procedure Lasix-AM of procedure  Except hold medications usual morning medications can be taken pre-cath with sips of water including aspirin 81 mg.    Confirmed patient has responsible adult to drive home post procedure and be with patient first 24 hours after arriving home.  [facility] does allow one visitor to accompany you and wait in the hospital waiting room while you are there for your procedure. You and your visitor will be asked to wear a mask once you enter the hospital.   Patient reports does not currently have any symptoms concerning for COVID-19 and no household members with COVID-19 like illness.       Call placed to patient to review procedure instructions, voicemail full, unable to leave message. LMTCB for patient's wife (DPR) to review procedure instructions.

## 2021-03-13 NOTE — Telephone Encounter (Signed)
Call placed to patient to review instructions-voicemail full, unable to leave message, left message for patient's wife (DPR), to call back to review procedure instructions.

## 2021-03-13 NOTE — Telephone Encounter (Signed)
Pt verbalized understanding for his TEE/ Cath instructions... he says he does not have any ASA but will go out now to purchase a small bottle... he says he has a stomach "virus" three days ago and had a few episodes of vomiting and has not been eating and drinking well since then but he feels much better.. he has a h/o GI upset... he does not have any other symptoms suggestive of COVID.   He says his wife will take him but will leave her cell phone for the hops to call him when he is almost ready to return home and with any updates.   PT took his last dose of Eliquis Sunday night 03/10/21 he wanted to be cautious that he would not have any bleeding problems during his procedures. I will let Dr. Irish Lack know.   PT will call back if he has any further questions or problems prior to his procedures tomorrow morning.

## 2021-03-14 ENCOUNTER — Ambulatory Visit (HOSPITAL_COMMUNITY)
Admission: RE | Admit: 2021-03-14 | Payer: Medicare PPO | Source: Ambulatory Visit | Admitting: Interventional Cardiology

## 2021-03-14 ENCOUNTER — Ambulatory Visit (HOSPITAL_COMMUNITY)
Admission: RE | Admit: 2021-03-14 | Discharge: 2021-03-14 | Disposition: A | Discharge status: Home / Self Care | Payer: Medicare PPO | Source: Ambulatory Visit | Attending: Internal Medicine | Admitting: Internal Medicine

## 2021-03-14 ENCOUNTER — Ambulatory Visit (HOSPITAL_BASED_OUTPATIENT_CLINIC_OR_DEPARTMENT_OTHER)
Admission: RE | Admit: 2021-03-14 | Discharge: 2021-03-14 | Disposition: A | Discharge status: Home / Self Care | Payer: Medicare PPO | Source: Ambulatory Visit | Attending: Interventional Cardiology | Admitting: Interventional Cardiology

## 2021-03-14 ENCOUNTER — Ambulatory Visit (HOSPITAL_COMMUNITY): Payer: Medicare PPO | Admitting: Certified Registered"

## 2021-03-14 ENCOUNTER — Encounter (HOSPITAL_COMMUNITY)
Admission: RE | Disposition: A | Discharge status: Home / Self Care | Payer: Self-pay | Source: Ambulatory Visit | Attending: Internal Medicine

## 2021-03-14 ENCOUNTER — Encounter (HOSPITAL_COMMUNITY): Payer: Self-pay | Admitting: Internal Medicine

## 2021-03-14 DIAGNOSIS — Z8616 Personal history of COVID-19: Secondary | ICD-10-CM | POA: Diagnosis not present

## 2021-03-14 DIAGNOSIS — I340 Nonrheumatic mitral (valve) insufficiency: Secondary | ICD-10-CM | POA: Insufficient documentation

## 2021-03-14 DIAGNOSIS — I2510 Atherosclerotic heart disease of native coronary artery without angina pectoris: Secondary | ICD-10-CM | POA: Diagnosis not present

## 2021-03-14 DIAGNOSIS — I4891 Unspecified atrial fibrillation: Secondary | ICD-10-CM | POA: Insufficient documentation

## 2021-03-14 DIAGNOSIS — Z79899 Other long term (current) drug therapy: Secondary | ICD-10-CM | POA: Diagnosis not present

## 2021-03-14 DIAGNOSIS — E785 Hyperlipidemia, unspecified: Secondary | ICD-10-CM | POA: Insufficient documentation

## 2021-03-14 DIAGNOSIS — I10 Essential (primary) hypertension: Secondary | ICD-10-CM | POA: Insufficient documentation

## 2021-03-14 DIAGNOSIS — I110 Hypertensive heart disease with heart failure: Secondary | ICD-10-CM | POA: Diagnosis not present

## 2021-03-14 DIAGNOSIS — I428 Other cardiomyopathies: Secondary | ICD-10-CM | POA: Diagnosis not present

## 2021-03-14 DIAGNOSIS — Z87891 Personal history of nicotine dependence: Secondary | ICD-10-CM | POA: Insufficient documentation

## 2021-03-14 DIAGNOSIS — I5022 Chronic systolic (congestive) heart failure: Secondary | ICD-10-CM | POA: Diagnosis not present

## 2021-03-14 DIAGNOSIS — Z7901 Long term (current) use of anticoagulants: Secondary | ICD-10-CM | POA: Diagnosis not present

## 2021-03-14 DIAGNOSIS — Z833 Family history of diabetes mellitus: Secondary | ICD-10-CM | POA: Insufficient documentation

## 2021-03-14 DIAGNOSIS — E119 Type 2 diabetes mellitus without complications: Secondary | ICD-10-CM | POA: Insufficient documentation

## 2021-03-14 HISTORY — PX: BUBBLE STUDY: SHX6837

## 2021-03-14 HISTORY — PX: RIGHT/LEFT HEART CATH AND CORONARY ANGIOGRAPHY: CATH118266

## 2021-03-14 HISTORY — PX: TEE WITHOUT CARDIOVERSION: SHX5443

## 2021-03-14 LAB — POCT I-STAT 7, (LYTES, BLD GAS, ICA,H+H)
Acid-base deficit: 2 mmol/L (ref 0.0–2.0)
Bicarbonate: 23.9 mmol/L (ref 20.0–28.0)
Calcium, Ion: 1.15 mmol/L (ref 1.15–1.40)
HCT: 47 % (ref 39.0–52.0)
Hemoglobin: 16 g/dL (ref 13.0–17.0)
O2 Saturation: 98 %
Potassium: 4.5 mmol/L (ref 3.5–5.1)
Sodium: 136 mmol/L (ref 135–145)
TCO2: 25 mmol/L (ref 22–32)
pCO2 arterial: 45.4 mmHg (ref 32.0–48.0)
pH, Arterial: 7.33 — ABNORMAL LOW (ref 7.350–7.450)
pO2, Arterial: 118 mmHg — ABNORMAL HIGH (ref 83.0–108.0)

## 2021-03-14 LAB — POCT I-STAT EG7
Acid-Base Excess: 0 mmol/L (ref 0.0–2.0)
Acid-base deficit: 2 mmol/L (ref 0.0–2.0)
Bicarbonate: 24.8 mmol/L (ref 20.0–28.0)
Bicarbonate: 26.6 mmol/L (ref 20.0–28.0)
Calcium, Ion: 1.15 mmol/L (ref 1.15–1.40)
Calcium, Ion: 1.18 mmol/L (ref 1.15–1.40)
HCT: 47 % (ref 39.0–52.0)
HCT: 47 % (ref 39.0–52.0)
Hemoglobin: 16 g/dL (ref 13.0–17.0)
Hemoglobin: 16 g/dL (ref 13.0–17.0)
O2 Saturation: 73 %
O2 Saturation: 75 %
Potassium: 4.6 mmol/L (ref 3.5–5.1)
Potassium: 4.7 mmol/L (ref 3.5–5.1)
Sodium: 136 mmol/L (ref 135–145)
Sodium: 137 mmol/L (ref 135–145)
TCO2: 26 mmol/L (ref 22–32)
TCO2: 28 mmol/L (ref 22–32)
pCO2, Ven: 47.6 mmHg (ref 44.0–60.0)
pCO2, Ven: 47.6 mmHg (ref 44.0–60.0)
pH, Ven: 7.325 (ref 7.250–7.430)
pH, Ven: 7.355 (ref 7.250–7.430)
pO2, Ven: 42 mmHg (ref 32.0–45.0)
pO2, Ven: 43 mmHg (ref 32.0–45.0)

## 2021-03-14 SURGERY — RIGHT/LEFT HEART CATH AND CORONARY ANGIOGRAPHY
Anesthesia: LOCAL

## 2021-03-14 SURGERY — ECHOCARDIOGRAM, TRANSESOPHAGEAL
Anesthesia: Monitor Anesthesia Care

## 2021-03-14 MED ORDER — SODIUM CHLORIDE 0.9% FLUSH: 3.0000 mL | INTRAVENOUS | Status: DC | PRN

## 2021-03-14 MED ORDER — LABETALOL HCL 5 MG/ML IV SOLN
10.0000 mg | INTRAVENOUS | Status: DC | PRN
  Administered 2021-03-14 (×2): 5 mg via INTRAVENOUS
  Filled 2021-03-14: qty 4

## 2021-03-14 MED ORDER — DEXAMETHASONE SODIUM PHOSPHATE 10 MG/ML IJ SOLN
INTRAMUSCULAR | Status: DC | PRN
  Administered 2021-03-14: 4 mg via INTRAVENOUS

## 2021-03-14 MED ORDER — HEPARIN SODIUM (PORCINE) 1000 UNIT/ML IJ SOLN
INTRAMUSCULAR | Status: DC | PRN
  Administered 2021-03-14: 5000 [IU] via INTRAVENOUS

## 2021-03-14 MED ORDER — AMLODIPINE BESYLATE 5 MG PO TABS
5.0000 mg | ORAL_TABLET | Freq: Every day | ORAL | Status: DC
  Administered 2021-03-14: 5 mg via ORAL
  Filled 2021-03-14: qty 1

## 2021-03-14 MED ORDER — SODIUM CHLORIDE 0.9 % IV SOLN: 250.0000 mL | INTRAVENOUS | Status: DC | PRN

## 2021-03-14 MED ORDER — SUCCINYLCHOLINE CHLORIDE 200 MG/10ML IV SOSY
PREFILLED_SYRINGE | INTRAVENOUS | Status: DC | PRN
  Administered 2021-03-14: 120 mg via INTRAVENOUS

## 2021-03-14 MED ORDER — ASPIRIN 81 MG PO CHEW
81.0000 mg | CHEWABLE_TABLET | ORAL | Status: AC
  Administered 2021-03-14: 81 mg via ORAL

## 2021-03-14 MED ORDER — SODIUM CHLORIDE 0.9 % IV SOLN: INTRAVENOUS | Status: DC

## 2021-03-14 MED ORDER — PROPOFOL 10 MG/ML IV BOLUS
INTRAVENOUS | Status: DC | PRN
  Administered 2021-03-14: 180 mg via INTRAVENOUS

## 2021-03-14 MED ORDER — METOPROLOL SUCCINATE ER 100 MG PO TB24
100.0000 mg | ORAL_TABLET | Freq: Every day | ORAL | Status: DC
  Administered 2021-03-14: 100 mg via ORAL
  Filled 2021-03-14: qty 1

## 2021-03-14 MED ORDER — METOPROLOL TARTRATE 5 MG/5ML IV SOLN
INTRAVENOUS | Status: DC | PRN
  Administered 2021-03-14: 5 mg via INTRAVENOUS

## 2021-03-14 MED ORDER — LIDOCAINE 2% (20 MG/ML) 5 ML SYRINGE
INTRAMUSCULAR | Status: DC | PRN
  Administered 2021-03-14: 60 mg via INTRAVENOUS

## 2021-03-14 MED ORDER — FENTANYL CITRATE (PF) 100 MCG/2ML IJ SOLN
INTRAMUSCULAR | Status: DC | PRN
  Administered 2021-03-14: 25 ug via INTRAVENOUS

## 2021-03-14 MED ORDER — FAMOTIDINE IN NACL 20-0.9 MG/50ML-% IV SOLN
20.0000 mg | Freq: Once | INTRAVENOUS | Status: AC
  Administered 2021-03-14: 20 mg via INTRAVENOUS
  Filled 2021-03-14: qty 50

## 2021-03-14 MED ORDER — ACETAMINOPHEN 325 MG PO TABS
650.0000 mg | ORAL_TABLET | ORAL | Status: DC | PRN
Start: 2021-03-14 — End: 2021-03-14

## 2021-03-14 MED ORDER — FENTANYL CITRATE (PF) 100 MCG/2ML IJ SOLN
INTRAMUSCULAR | Status: DC | PRN
  Administered 2021-03-14: 100 ug via INTRAVENOUS

## 2021-03-14 MED ORDER — PHENYLEPHRINE 40 MCG/ML (10ML) SYRINGE FOR IV PUSH (FOR BLOOD PRESSURE SUPPORT)
PREFILLED_SYRINGE | INTRAVENOUS | Status: DC | PRN
  Administered 2021-03-14: 120 ug via INTRAVENOUS

## 2021-03-14 MED ORDER — HYDRALAZINE HCL 20 MG/ML IJ SOLN: 10.0000 mg | INTRAMUSCULAR | Status: DC | PRN

## 2021-03-14 MED ORDER — SODIUM CHLORIDE 0.9% FLUSH: 3.0000 mL | Freq: Two times a day (BID) | INTRAVENOUS | Status: DC

## 2021-03-14 MED ORDER — ONDANSETRON HCL 4 MG/2ML IJ SOLN: 4.0000 mg | Freq: Four times a day (QID) | INTRAMUSCULAR | Status: DC | PRN

## 2021-03-14 MED ORDER — VERAPAMIL HCL 2.5 MG/ML IV SOLN
INTRAVENOUS | Status: DC | PRN
  Administered 2021-03-14 (×2): 10 mL via INTRA_ARTERIAL

## 2021-03-14 MED ORDER — ONDANSETRON HCL 4 MG/2ML IJ SOLN
INTRAMUSCULAR | Status: DC | PRN
  Administered 2021-03-14: 4 mg via INTRAVENOUS

## 2021-03-14 MED ORDER — ASPIRIN 81 MG PO CHEW
CHEWABLE_TABLET | ORAL | Status: AC
  Filled 2021-03-14: qty 1

## 2021-03-14 MED ORDER — LIDOCAINE HCL (PF) 1 % IJ SOLN
INTRAMUSCULAR | Status: DC | PRN
  Administered 2021-03-14 (×2): 2 mL

## 2021-03-14 MED ORDER — HEPARIN (PORCINE) IN NACL 1000-0.9 UT/500ML-% IV SOLN
INTRAVENOUS | Status: DC | PRN
  Administered 2021-03-14 (×2): 500 mL

## 2021-03-14 MED ORDER — IOHEXOL 350 MG/ML SOLN
INTRAVENOUS | Status: DC | PRN
  Administered 2021-03-14: 60 mL

## 2021-03-14 MED ORDER — MIDAZOLAM HCL 2 MG/2ML IJ SOLN
INTRAMUSCULAR | Status: DC | PRN
  Administered 2021-03-14: 1 mg via INTRAVENOUS

## 2021-03-14 SURGICAL SUPPLY — 14 items
BAG SNAP BAND KOVER 36X36 (MISCELLANEOUS) ×2 IMPLANT
CATH 5FR JL3.5 JR4 ANG PIG MP (CATHETERS) ×2 IMPLANT
CATH BALLN WEDGE 5F 110CM (CATHETERS) ×2 IMPLANT
DEVICE RAD COMP TR BAND LRG (VASCULAR PRODUCTS) ×4 IMPLANT
GLIDESHEATH SLEND SS 6F .021 (SHEATH) ×2 IMPLANT
GUIDEWIRE INQWIRE 1.5J.035X260 (WIRE) ×1 IMPLANT
INQWIRE 1.5J .035X260CM (WIRE) ×2
KIT HEART LEFT (KITS) ×2 IMPLANT
MAT PREVALON FULL STRYKER (MISCELLANEOUS) ×2 IMPLANT
PACK CARDIAC CATHETERIZATION (CUSTOM PROCEDURE TRAY) ×2 IMPLANT
SHEATH GLIDE SLENDER 4/5FR (SHEATH) ×2 IMPLANT
SHEATH PROBE COVER 6X72 (BAG) ×2 IMPLANT
TRANSDUCER W/STOPCOCK (MISCELLANEOUS) ×2 IMPLANT
TUBING CIL FLEX 10 FLL-RA (TUBING) ×2 IMPLANT

## 2021-03-14 NOTE — Transfer of Care (Signed)
Immediate Anesthesia Transfer of Care Note  Patient: [name]  Procedure(s) Performed: TRANSESOPHAGEAL ECHOCARDIOGRAM (TEE) BUBBLE STUDY  Patient Location: PACU  Anesthesia Type:General  Level of Consciousness: oriented and drowsy  Airway & Oxygen Therapy: Patient Spontanous Breathing and Patient connected to face mask oxygen  Post-op Assessment: Report given to RN  Post vital signs: Reviewed and stable  Last Vitals:  Vitals Value Taken Time  BP 159/68   Temp    Pulse 47 03/14/21 1117  Resp 27 03/14/21 1117  SpO2 97 % 03/14/21 1117  Vitals shown include unvalidated device data.  Last Pain:  Vitals:   03/14/21 0900  TempSrc: Temporal  PainSc: 0-No pain         Complications: No notable events documented.

## 2021-03-14 NOTE — CV Procedure (Signed)
INDICATIONS: Mitral valve regurgitation  PROCEDURE:   Informed consent was obtained prior to the procedure. The risks, benefits and alternatives for the procedure were discussed and the patient comprehended these risks.  Risks include, but are not limited to, cough, sore throat, vomiting, nausea, somnolence, esophageal and stomach trauma or perforation, bleeding, low blood pressure, aspiration, pneumonia, infection, trauma to the teeth and death.    After a procedural time-out, the oropharynx was anesthetized with 20% benzocaine spray.   During this procedure the patient was intubated and sedated per anesthesia.  The patient's heart rate, blood pressure, and oxygen saturation were monitored continuously during the procedure. The period of conscious sedation was 40 minutes, of which I was present face-to-face 100% of this time.  The transesophageal probe was inserted in the esophagus and stomach without difficulty and multiple views were obtained.  The patient was kept under observation until the patient left the procedure room.  The patient left the procedure room in stable condition.   Agitated microbubble saline contrast was administered.  COMPLICATIONS:    There were no immediate complications.  FINDINGS:   FORMAL ECHOCARDIOGRAM REPORT PENDING Severe mitral valve regurgitation, Carpentier type IIIb. There is restriction of the posterior leaflet of the mitral valve, with override of the anterior leaflet. No mitral valve prolapse noted. LVEF appears 35%. Right sided pulmonary veins seem to demonstrate some systolic reversal, however entire study performed with patient in afib RVR.  Trivial TR, AR, PR.    RECOMMENDATIONS:    Transport to cath lab for [facility].   Time Spent Directly with the Patient:  60 minutes   Sharnita Bogucki A Margaretann Loveless 03/14/2021, 11:06 AM

## 2021-03-14 NOTE — Interval H&P Note (Signed)
History and Physical Interval Note:  03/14/2021 12:04 PM  [name]  has presented today for surgery, with the diagnosis of MR.  The various methods of treatment have been discussed with the patient and family. After consideration of risks, benefits and other options for treatment, the patient has consented to  Procedure(s): RIGHT/LEFT HEART CATH AND CORONARY ANGIOGRAPHY (N/A) as a surgical intervention.  The patient's history has been reviewed, patient examined, no change in status, stable for surgery.  I have reviewed the patient's chart and labs.  Questions were answered to the patient's satisfaction.    Plan for diagnostic cath prior to MV repair.    Larae Grooms

## 2021-03-14 NOTE — Anesthesia Procedure Notes (Addendum)
Procedure Name: Intubation Date/Time: 03/14/2021 10:10 AM Performed by: Barrington Ellison, CRNA Pre-anesthesia Checklist: Patient identified, Emergency Drugs available, Suction available and Patient being monitored Patient Re-evaluated:Patient Re-evaluated prior to induction Oxygen Delivery Method: Circle System Utilized Preoxygenation: Pre-oxygenation with 100% oxygen Induction Type: IV induction, Rapid sequence and Cricoid Pressure applied Laryngoscope Size: Mac and 4 Tube type: Oral Tube size: 7.5 mm Number of attempts: 1 Airway Equipment and Method: Stylet and Oral airway Placement Confirmation: ETT inserted through vocal cords under direct vision, positive ETCO2 and breath sounds checked- equal and bilateral Secured at: 22 cm Tube secured with: Tape Dental Injury: Teeth and Oropharynx as per pre-operative assessment

## 2021-03-14 NOTE — Progress Notes (Signed)
Echocardiogram Echocardiogram Transesophageal has been performed.  Oneal Deputy Psalm Arman RDCS 03/14/2021, 11:17 AM

## 2021-03-14 NOTE — Interval H&P Note (Signed)
History and Physical Interval Note:  03/14/2021 9:15 AM  [name]  has presented today for surgery, with the diagnosis of MITRAL Regurgitation.  The various methods of treatment have been discussed with the patient and family. After consideration of risks, benefits and other options for treatment, the patient has consented to  Procedure(s): TRANSESOPHAGEAL ECHOCARDIOGRAM (TEE) (N/A) as a surgical intervention.  The patient's history has been reviewed, patient examined, no change in status, stable for surgery.  I have reviewed the patient's chart and labs.  Questions were answered to the patient's satisfaction.     [name]

## 2021-03-15 ENCOUNTER — Encounter (HOSPITAL_COMMUNITY): Payer: Self-pay | Admitting: Interventional Cardiology

## 2021-03-15 NOTE — Progress Notes (Signed)
[name] DOB [birth year]   This looks like a clippable valve. The fossa looks approachable for transseptal puncture in the Bicaval and SAXB views. LA dimensions are large enough for device steering and straddle. The MR jet is broad based and centrally located. The posterior leaflet measures 1.4 cm in the 133 LVOT grasping view. Gradient measures between 2-2.5 mmHg with a heart rate of 102 BPM; MVA measures around 7cm2. Based on this information, I'd start with an NTW or XTW and assess for gradient.

## 2021-03-16 LAB — ECHO TEE
MV M vel: 5.71 m/s
MV Peak grad: 130.4 mmHg
Radius: 1.2 cm

## 2021-03-17 ENCOUNTER — Encounter (HOSPITAL_COMMUNITY): Payer: Self-pay | Admitting: Internal Medicine

## 2021-03-20 ENCOUNTER — Telehealth (HOSPITAL_COMMUNITY): Payer: Self-pay | Admitting: Vascular Surgery

## 2021-03-20 NOTE — Telephone Encounter (Signed)
Called pt to schedule NEW CHF URGENT appt w. Db, pt VM is full will try back later

## 2021-03-21 ENCOUNTER — Institutional Professional Consult (permissible substitution): Payer: Medicare PPO | Admitting: Cardiovascular Disease

## 2021-03-21 NOTE — Anesthesia Postprocedure Evaluation (Signed)
Anesthesia Post Note  Patient: [name]  Procedure(s) Performed: TRANSESOPHAGEAL ECHOCARDIOGRAM (TEE) BUBBLE STUDY     Patient location during evaluation: PACU Anesthesia Type: General Level of consciousness: awake and alert Pain management: pain level controlled Vital Signs Assessment: post-procedure vital signs reviewed and stable Respiratory status: spontaneous breathing, nonlabored ventilation, respiratory function stable and patient connected to nasal cannula oxygen Cardiovascular status: blood pressure returned to baseline and stable Postop Assessment: no apparent nausea or vomiting Anesthetic complications: no   No notable events documented.  Last Vitals:  Vitals:   03/14/21 1600 03/14/21 1615  BP: (!) 157/93 (!) 144/94  Pulse: 78 92  Resp:    Temp:    SpO2: 92% 94%    Last Pain:  Vitals:   03/14/21 1316  TempSrc:   PainSc: 0-No pain                 Gordan Grell

## 2021-03-21 NOTE — Anesthesia Preprocedure Evaluation (Addendum)
Anesthesia Evaluation  Patient identified by MRN, date of birth, ID band Patient awake    Reviewed: Allergy & Precautions, NPO status , [name] Chart, lab work & pertinent test results, reviewed documented beta blocker date and time   Airway Mallampati: III  TM Distance: >3 FB Neck ROM: Full    Dental  (+) Edentulous Upper, Dental Advisory Given,    Pulmonary sleep apnea and Continuous Positive Airway Pressure Ventilation , former smoker,    breath sounds clear to auscultation       Cardiovascular hypertension, Pt. on medications and Pt. on home beta blockers +CHF  + dysrhythmias Atrial Fibrillation + Valvular Problems/Murmurs MR  Rhythm:Regular Rate:Normal  TTE 2020 1. The left ventricle has moderate-severely reduced systolic function, with an ejection fraction of 30-35%. The cavity size was severely dilated. Left ventricular diastolic function could not be evaluated secondary to atrial fibrillation. Left ventricular diffuse hypokinesis.  2. The right ventricle has normal systolic function. The cavity was normal. There is no increase in right ventricular wall thickness.  3. Left atrial size was severely dilated.  4. There is moderate mitral annular calcification present. Mitral valve regurgitation is moderate by color flow Doppler. The MR jet is posteriorly-directed.  5. The aortic valve is tricuspid. Mild sclerosis of the aortic valve.  6. The aorta is normal unless otherwise noted.    Neuro/Psych negative neurological ROS  negative psych ROS   GI/Hepatic negative GI ROS, Neg liver ROS,   Endo/Other  Hypothyroidism   Renal/GU negative Renal ROS  negative genitourinary   Musculoskeletal  (+) Arthritis ,   Abdominal   Peds  Hematology  (+) Blood dyscrasia (on eliquis), ,   Anesthesia Other Findings   Reproductive/Obstetrics                             Anesthesia Physical Anesthesia  Plan  ASA: 3  Anesthesia Plan: MAC and General   Post-op Pain Management:    Induction: Intravenous  PONV Risk Score and Plan: 2 and Ondansetron, Dexamethasone and Treatment may vary due to age or medical condition  Airway Management Planned: Oral ETT  Additional Equipment: None  Intra-op Plan:   Post-operative Plan: Extubation in OR  Informed Consent: I have reviewed the patients History and Physical, chart, labs and discussed the procedure including the risks, benefits and alternatives for the proposed anesthesia with the patient or authorized representative who has indicated his/her understanding and acceptance.     Dental advisory given  Plan Discussed with: CRNA and Anesthesiologist  Anesthesia Plan Comments:        Anesthesia Quick Evaluation

## 2021-03-21 NOTE — Telephone Encounter (Signed)
Second attempt to contact pt to schedule urgent appt, pt VM is full will try back

## 2021-04-02 ENCOUNTER — Telehealth: Payer: Self-pay

## 2021-04-02 NOTE — Telephone Encounter (Signed)
Multiple attempts have been made by the Structural Heart team and [facility]F Clinic to contact this pt to arrange evaluation in regards to Severe Mitral Regurgitation.  The pt's voicemail is full and will not allow [place] to leave a message.  I will mail the pt a letter to contact the office to arrange follow-up.

## 2021-04-30 ENCOUNTER — Institutional Professional Consult (permissible substitution): Payer: Medicare PPO | Admitting: Cardiovascular Disease

## 2021-05-22 ENCOUNTER — Other Ambulatory Visit: Payer: Self-pay | Admitting: Interventional Cardiology

## 2021-06-06 ENCOUNTER — Encounter: Payer: Self-pay | Admitting: Hematology & Oncology

## 2021-06-13 ENCOUNTER — Telehealth: Payer: Self-pay | Admitting: Interventional Cardiology

## 2021-06-13 NOTE — Telephone Encounter (Signed)
I ws able to leave a message on the wife's Horris Latino) voicemail.  You can try that number.  He is scheduled to see me next week.  JV

## 2021-06-14 ENCOUNTER — Ambulatory Visit: Payer: Self-pay | Admitting: Interventional Cardiology

## 2021-06-14 NOTE — Telephone Encounter (Signed)
Pt scheduled to see Dr Irish Lack today in clinic.  I have spoken with his nurse, Fraser Din, and she will arrange MitraClip consult with Dr Burt Knack from today's clinic visit.

## 2021-06-22 ENCOUNTER — Other Ambulatory Visit: Payer: Self-pay | Admitting: Interventional Cardiology

## 2021-06-22 DIAGNOSIS — I4821 Permanent atrial fibrillation: Secondary | ICD-10-CM

## 2021-06-24 ENCOUNTER — Other Ambulatory Visit: Payer: Self-pay | Admitting: *Deleted

## 2021-06-24 MED ORDER — FUROSEMIDE 40 MG PO TABS: 40.0000 mg | ORAL_TABLET | Freq: Two times a day (BID) | ORAL | 0 refills | Status: DC

## 2021-06-24 NOTE — Telephone Encounter (Signed)
I placed call to patient.  Mailbox is full

## 2021-06-24 NOTE — Telephone Encounter (Signed)
OK to refill.  WOuld continue to try to reach him.   JV

## 2021-06-24 NOTE — Telephone Encounter (Signed)
Eliquis 5mg  refill request received. Patient is 71 years old, weight-108.9kg, Crea-0.82 on 02/28/2021, Diagnosis-Afib, and last seen by Dr. Irish Lack on 02/28/2021. Dose is appropriate based on dosing criteria. Will send in refill to requested pharmacy.

## 2021-07-08 ENCOUNTER — Telehealth: Payer: Self-pay

## 2021-07-08 NOTE — Telephone Encounter (Signed)
Patient called stating he had an incident at work and has galvanized poisoning and saw his pcp and was given the wrong medication and is seeing if Dr.Ennever can help him as he hasnt been able to eat in 11 days. Called patient back and informed him he would need to see his pcp or go to ED or urgent care for tx for that. He verbalized understanding and will seek care via ED.

## 2021-07-29 NOTE — Telephone Encounter (Signed)
Weekly calls have been placed to the patient since he no-showed to Dr. Hassell Done visit 06/14/2021 with no response.  Letter sent to patient to contact the office to reschedule.

## 2021-08-03 ENCOUNTER — Other Ambulatory Visit: Payer: Self-pay | Admitting: Interventional Cardiology

## 2021-10-09 ENCOUNTER — Telehealth: Payer: Self-pay | Admitting: *Deleted

## 2021-10-09 NOTE — Telephone Encounter (Signed)
Closing encounter

## 2021-10-17 ENCOUNTER — Other Ambulatory Visit: Payer: Self-pay | Admitting: Interventional Cardiology

## 2021-12-20 ENCOUNTER — Ambulatory Visit: Payer: Medicare HMO | Admitting: Interventional Cardiology

## 2022-01-31 ENCOUNTER — Ambulatory Visit: Payer: Medicare HMO | Admitting: Interventional Cardiology

## 2022-03-12 ENCOUNTER — Other Ambulatory Visit: Payer: Self-pay | Admitting: Interventional Cardiology

## 2022-04-29 ENCOUNTER — Other Ambulatory Visit: Payer: Self-pay | Admitting: Interventional Cardiology

## 2022-05-19 ENCOUNTER — Other Ambulatory Visit: Payer: Self-pay | Admitting: Interventional Cardiology

## 2022-05-19 NOTE — Telephone Encounter (Signed)
Rx refill sent to pharmacy. 

## 2022-06-10 ENCOUNTER — Other Ambulatory Visit: Payer: Self-pay | Admitting: Interventional Cardiology

## 2022-06-17 ENCOUNTER — Other Ambulatory Visit: Payer: Self-pay

## 2022-06-17 ENCOUNTER — Telehealth: Payer: Self-pay | Admitting: Interventional Cardiology

## 2022-06-17 ENCOUNTER — Other Ambulatory Visit: Payer: Self-pay | Admitting: Interventional Cardiology

## 2022-06-17 ENCOUNTER — Encounter: Payer: Self-pay | Admitting: Hematology & Oncology

## 2022-06-17 DIAGNOSIS — I4821 Permanent atrial fibrillation: Secondary | ICD-10-CM

## 2022-06-17 MED ORDER — APIXABAN 5 MG PO TABS
5.0000 mg | ORAL_TABLET | Freq: Two times a day (BID) | ORAL | 0 refills | Status: DC
Start: 2022-06-17 — End: 2022-08-25

## 2022-06-17 MED ORDER — APIXABAN 5 MG PO TABS
5.0000 mg | ORAL_TABLET | Freq: Two times a day (BID) | ORAL | 0 refills | Status: DC
Start: 2022-06-17 — End: 2022-06-17

## 2022-06-17 NOTE — Telephone Encounter (Signed)
Prescription refill request for Eliquis received. Indication:afib Last office visit:needs appt Scr:1.4 Age: 72 Weight:108.9  kg  Prescription refilled

## 2022-06-17 NOTE — Telephone Encounter (Signed)
*  STAT* If patient is at the pharmacy, call can be transferred to refill team.   1. Which medications need to be refilled? (please list name of each medication and dose if known)   apixaban (ELIQUIS) 5 MG TABS tablet   2. Which pharmacy/location (including street and city if local pharmacy) is medication to be sent to?  [id number] - [place], [place] - [address]   3. Do they need a 30 day or 90 day supply? 14 days     Patient stated he will need 2 weeks supply sent to [facility] until his medication is delivered from [facility]. Patient stated he has been completely out of this medication for 2 days.

## 2022-06-17 NOTE — Telephone Encounter (Signed)
Eliquis '5mg'$  refill request received. Patient is 72 years old, weight-108.9kg, Crea-1.43 on 07/12/2021 via care Everywhere from [facility], Diagnosis-Afib, and last seen by Dr. Irish Lack on 02/28/2021 and pending appt on 07/04/22. Dose is appropriate based on dosing criteria. Will send in limited refill to requested pharmacy.    90 day supply sent by another user to mail order. Pt needs to adhere to appt and have updated labs for further refills since last seen 2022.   ORDERED LABS AND PLACED A NOTE ON APPT WITH ERNEST ON 07/04/2022

## 2022-06-17 NOTE — Telephone Encounter (Signed)
Prescription refill request for Eliquis received. Indication:afib Last office visit: Scr: Age:  Weight:

## 2022-06-27 ENCOUNTER — Other Ambulatory Visit: Payer: Self-pay | Admitting: Interventional Cardiology

## 2022-06-27 ENCOUNTER — Encounter: Payer: Self-pay | Admitting: Hematology & Oncology

## 2022-06-27 DIAGNOSIS — I4821 Permanent atrial fibrillation: Secondary | ICD-10-CM

## 2022-06-27 NOTE — Telephone Encounter (Signed)
Refill sent on 06/17/22 to last until upcoming appt on 07/04/22.
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
## 2022-07-04 ENCOUNTER — Encounter: Payer: Self-pay | Admitting: Nurse Practitioner

## 2022-07-04 ENCOUNTER — Ambulatory Visit: Payer: Medicare PPO

## 2022-07-04 ENCOUNTER — Ambulatory Visit: Payer: Medicare PPO | Attending: Nurse Practitioner | Admitting: Nurse Practitioner

## 2022-07-04 VITALS — BP 98/62 | HR 77 | Ht 70.5 in | Wt 238.2 lb

## 2022-07-04 DIAGNOSIS — I5022 Chronic systolic (congestive) heart failure: Secondary | ICD-10-CM

## 2022-07-04 DIAGNOSIS — I340 Nonrheumatic mitral (valve) insufficiency: Secondary | ICD-10-CM

## 2022-07-04 DIAGNOSIS — I4821 Permanent atrial fibrillation: Secondary | ICD-10-CM

## 2022-07-04 DIAGNOSIS — I10 Essential (primary) hypertension: Secondary | ICD-10-CM | POA: Diagnosis not present

## 2022-07-04 DIAGNOSIS — I428 Other cardiomyopathies: Secondary | ICD-10-CM

## 2022-07-04 DIAGNOSIS — E782 Mixed hyperlipidemia: Secondary | ICD-10-CM | POA: Diagnosis not present

## 2022-07-04 DIAGNOSIS — N23 Unspecified renal colic: Secondary | ICD-10-CM

## 2022-07-04 NOTE — Patient Instructions (Signed)
Medication Instructions:  Your physician recommends that you continue on your current medications as directed. Please refer to the Current Medication list given to you today. *If you need a refill on your cardiac medications before your next appointment, please call your pharmacy*   Lab Work: None ordered   Testing/Procedures: Your physician has requested that you have a renal artery duplex. During this test, an ultrasound is used to evaluate blood flow to the kidneys. Allow one hour for this exam. Do not eat after midnight the day before and avoid carbonated beverages. Take your medications as you usually do.   Follow-Up: At [facility], you and your health needs are our priority.  As part of our continuing mission to provide you with exceptional heart care, we have created designated Provider Care Teams.  These Care Teams include your primary Cardiologist (physician) and Advanced Practice Providers (APPs -  Physician Assistants and Nurse Practitioners) who all work together to provide you with the care you need, when you need it.  We recommend signing up for the patient portal called "MyChart".  Sign up information is provided on this After Visit Summary.  MyChart is used to connect with patients for Virtual Visits (Telemedicine).  Patients are able to view lab/test results, encounter notes, upcoming appointments, etc.  Non-urgent messages can be sent to your provider as well.   To learn more about what you can do with MyChart, go to [email or web address].    Your next appointment:   FIRST AVAILABLE    Provider:   Sherren Mocha, MD, Lauree Chandler, MD, Lenna Sciara, MD, Kathyrn Drown, NP, or Nell Range, PA-C    Other Instructions  You have been referred to [place]

## 2022-07-05 LAB — BASIC METABOLIC PANEL
BUN/Creatinine Ratio: 14 (ref 10–24)
BUN: 11 mg/dL (ref 8–27)
CO2: 21 mmol/L (ref 20–29)
Calcium: 9.3 mg/dL (ref 8.6–10.2)
Chloride: 103 mmol/L (ref 96–106)
Creatinine, Ser: 0.79 mg/dL (ref 0.76–1.27)
Glucose: 85 mg/dL (ref 70–99)
Potassium: 4.5 mmol/L (ref 3.5–5.2)
Sodium: 138 mmol/L (ref 134–144)
eGFR: 96 mL/min/{1.73_m2} (ref >59)

## 2022-07-05 LAB — CBC
Hematocrit: 44 % (ref 37.5–51.0)
Hemoglobin: 15.4 g/dL (ref 13.0–17.7)
MCH: 31 pg (ref 26.6–33.0)
MCHC: 35 g/dL (ref 31.5–35.7)
MCV: 89 fL (ref 79–97)
Platelets: 148 10*3/uL — ABNORMAL LOW (ref 150–450)
RBC: 4.96 x10E6/uL (ref 4.14–5.80)
RDW: 13 % (ref 11.6–15.4)
WBC: 8.6 10*3/uL (ref 3.4–10.8)

## 2022-07-09 ENCOUNTER — Telehealth: Payer: Self-pay

## 2022-07-09 ENCOUNTER — Other Ambulatory Visit: Payer: Self-pay | Admitting: Interventional Cardiology

## 2022-07-09 DIAGNOSIS — I340 Nonrheumatic mitral (valve) insufficiency: Secondary | ICD-10-CM

## 2022-07-09 NOTE — Telephone Encounter (Addendum)
Per Ambrose Pancoast, scheduled the patient for MR consult with Dr. Ali Lowe 08/08/2022. He has not had any imaging since 2022. Scheduled him for echocardiogram prior to visit as the patient can only do Friday visits. The patient was grateful for call and agreed with plan.

## 2022-07-15 ENCOUNTER — Other Ambulatory Visit: Payer: Self-pay | Admitting: Interventional Cardiology

## 2022-07-18 ENCOUNTER — Ambulatory Visit (HOSPITAL_COMMUNITY)
Admission: RE | Admit: 2022-07-18 | Discharge: 2022-07-18 | Disposition: A | Discharge status: Home / Self Care | Payer: Medicare PPO | Source: Ambulatory Visit | Attending: Internal Medicine | Admitting: Internal Medicine

## 2022-07-18 DIAGNOSIS — I4821 Permanent atrial fibrillation: Secondary | ICD-10-CM | POA: Diagnosis present

## 2022-07-18 DIAGNOSIS — I428 Other cardiomyopathies: Secondary | ICD-10-CM | POA: Diagnosis present

## 2022-07-18 DIAGNOSIS — I5022 Chronic systolic (congestive) heart failure: Secondary | ICD-10-CM | POA: Diagnosis not present

## 2022-07-18 DIAGNOSIS — I340 Nonrheumatic mitral (valve) insufficiency: Secondary | ICD-10-CM | POA: Diagnosis present

## 2022-07-18 DIAGNOSIS — E782 Mixed hyperlipidemia: Secondary | ICD-10-CM | POA: Diagnosis not present

## 2022-07-18 DIAGNOSIS — I10 Essential (primary) hypertension: Secondary | ICD-10-CM | POA: Diagnosis present
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
## 2022-08-08 ENCOUNTER — Encounter: Payer: Self-pay | Admitting: Internal Medicine

## 2022-08-08 ENCOUNTER — Ambulatory Visit (HOSPITAL_BASED_OUTPATIENT_CLINIC_OR_DEPARTMENT_OTHER): Payer: Medicare PPO

## 2022-08-08 ENCOUNTER — Ambulatory Visit (HOSPITAL_COMMUNITY): Payer: Medicare PPO | Attending: Internal Medicine | Admitting: Internal Medicine

## 2022-08-08 VITALS — BP 144/60 | HR 93 | Ht 70.5 in | Wt 240.2 lb

## 2022-08-08 DIAGNOSIS — I340 Nonrheumatic mitral (valve) insufficiency: Secondary | ICD-10-CM | POA: Insufficient documentation

## 2022-08-08 DIAGNOSIS — I4821 Permanent atrial fibrillation: Secondary | ICD-10-CM | POA: Insufficient documentation

## 2022-08-08 DIAGNOSIS — I428 Other cardiomyopathies: Secondary | ICD-10-CM | POA: Insufficient documentation

## 2022-08-08 LAB — ECHOCARDIOGRAM COMPLETE
AR max vel: 1.47 cm2
AV Area VTI: 1.5 cm2
AV Area mean vel: 1.46 cm2
AV Mean grad: 8 mmHg
AV Peak grad: 15.4 mmHg
Ao pk vel: 1.96 m/s
Area-P 1/2: 3.08 cm2
MV M vel: 5.62 m/s
MV Peak grad: 126.3 mmHg
S' Lateral: 4.5 cm

## 2022-08-08 NOTE — Patient Instructions (Signed)
Medication Instructions:  Your physician recommends that you continue on your current medications as directed. Please refer to the Current Medication list given to you today.  *If you need a refill on your cardiac medications before your next appointment, please call your pharmacy*  Lab Work: If you have labs (blood work) drawn today and your tests are completely normal, you will receive your results only by: [place] (if you have MyChart) OR A paper copy in the mail If you have any lab test that is abnormal or we need to change your treatment, we will call you to review the results.  Testing/Procedures: Your physician has requested that you have an echocardiogram in 6 months. Echocardiography is a painless test that uses sound waves to create images of your heart. It provides your doctor with information about the size and shape of your heart and how well your heart's chambers and valves are working. This procedure takes approximately one hour. There are no restrictions for this procedure. Please do NOT wear cologne, perfume, aftershave, or lotions (deodorant is allowed). Please arrive 15 minutes prior to your appointment time.  Follow-Up: At [facility], you and your health needs are our priority.  As part of our continuing mission to provide you with exceptional heart care, we have created designated Provider Care Teams.  These Care Teams include your primary Cardiologist (physician) and Advanced Practice Providers (APPs -  Physician Assistants and Nurse Practitioners) who all work together to provide you with the care you need, when you need it.  We recommend signing up for the patient portal called "MyChart".  Sign up information is provided on this After Visit Summary.  MyChart is used to connect with patients for Virtual Visits (Telemedicine).  Patients are able to view lab/test results, encounter notes, upcoming appointments, etc.  Non-urgent messages can be sent to your  provider as well.   To learn more about what you can do with MyChart, go to [email or web address].    Your next appointment:   6 month(s)  Provider:   Dr. Ali Lowe

## 2022-08-18 ENCOUNTER — Telehealth: Payer: Self-pay

## 2022-08-18 DIAGNOSIS — I340 Nonrheumatic mitral (valve) insufficiency: Secondary | ICD-10-CM

## 2022-08-18 DIAGNOSIS — R0600 Dyspnea, unspecified: Secondary | ICD-10-CM

## 2022-08-18 NOTE — Telephone Encounter (Signed)
Per Dr. Ali Lowe, called to arrange 1 month follow-up (instead of 3 month).   Unable to leave message as the patient's voicemail is full. Will try again later.

## 2022-08-22 NOTE — Telephone Encounter (Signed)
Scheduled the patient for visit with Dr. Ali Lowe 09/12/2022.

## 2022-08-23 ENCOUNTER — Other Ambulatory Visit: Payer: Self-pay | Admitting: Interventional Cardiology

## 2022-08-23 DIAGNOSIS — I4821 Permanent atrial fibrillation: Secondary | ICD-10-CM

## 2022-08-25 NOTE — Telephone Encounter (Signed)
Prescription refill request for Eliquis received. Indication: Afib  Last office visit: 08/08/22 [name])  Scr: 0.79 (07/04/22)  Age: 72 Weight: 109kg  Appropriate dose. Refill sent.

## 2022-09-04 NOTE — Addendum Note (Signed)
Addended by: Harland German A on: 09/04/2022 02:34 PM   Modules accepted: Orders

## 2022-09-05 NOTE — Telephone Encounter (Signed)
[name]' is historically very difficult to reach. There have been several attempts to call recently but his voicemail is full and therefore messages cannot be left.  GXTs are not scheduled on Fridays at [facility], but the patient is only available Fridays for appointments.  Spoke with stress test technician [name]). She generously offered to schedule stress test 09/12/2022 at 0900 (after visit with Dr. Lynnette Caffey at 0800). She understands the stress test is tentative based on visit with Dr. Lynnette Caffey and will be confirmed that day.  I have tried and will continue to contact the patient to instruct him to wear work out clothing and to fast the morning of the appointment. Will instruct him to take his meds as directed (he takes metoprolol in the AM) as [name] has afib and this is not a CAD eval.

## 2022-09-08 NOTE — Telephone Encounter (Signed)
Attempted to call patient. Mailbox is full - unable to leave message.  Will try again later. 
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
## 2022-09-09 NOTE — Telephone Encounter (Signed)
Attempted to call patient. Mailbox is full - unable to leave message.  Will try again later. 

## 2022-09-10 NOTE — Telephone Encounter (Signed)
The patient called to cancel his appointment for this Friday due to a potential sinus infection. While he was offered several sooner appointments, scheduled the patient 10/24/22 at 0800 with Dr. Lynnette Caffey. He will wear tennis shoes and comfortable clothing for GXT that day. He will not eat breakfast. He was grateful for assistance.  Spoke with Trenton Gammon, GXT tech at [employer]. She confirmed she is able to do the GXT at 0900 that day (GXTs are not routinely completed on Fridays).

## 2022-09-12 ENCOUNTER — Ambulatory Visit: Payer: Medicare PPO | Admitting: Internal Medicine

## 2022-09-12 ENCOUNTER — Ambulatory Visit: Payer: Medicare PPO

## 2022-09-12 DIAGNOSIS — I428 Other cardiomyopathies: Secondary | ICD-10-CM

## 2022-09-12 DIAGNOSIS — I4821 Permanent atrial fibrillation: Secondary | ICD-10-CM

## 2022-09-12 DIAGNOSIS — I340 Nonrheumatic mitral (valve) insufficiency: Secondary | ICD-10-CM

## 2022-09-13 ENCOUNTER — Encounter: Payer: Self-pay | Admitting: Internal Medicine
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
## 2022-10-23 ENCOUNTER — Ambulatory Visit: Payer: Medicare PPO | Admitting: Internal Medicine

## 2022-10-24 ENCOUNTER — Ambulatory Visit: Payer: Medicare PPO

## 2022-10-24 ENCOUNTER — Encounter: Payer: Self-pay | Admitting: Internal Medicine

## 2022-10-24 ENCOUNTER — Ambulatory Visit: Payer: Medicare PPO | Attending: Internal Medicine | Admitting: Internal Medicine

## 2022-10-24 VITALS — BP 122/74 | HR 67 | Ht 70.5 in | Wt 235.6 lb

## 2022-10-24 DIAGNOSIS — I4821 Permanent atrial fibrillation: Secondary | ICD-10-CM | POA: Diagnosis not present

## 2022-10-24 DIAGNOSIS — I340 Nonrheumatic mitral (valve) insufficiency: Secondary | ICD-10-CM

## 2022-10-24 DIAGNOSIS — I428 Other cardiomyopathies: Secondary | ICD-10-CM

## 2022-10-24 MED ORDER — EMPAGLIFLOZIN 10 MG PO TABS: 10.0000 mg | ORAL_TABLET | Freq: Every day | ORAL | 5 refills | Status: DC

## 2022-10-24 MED ORDER — EMPAGLIFLOZIN 10 MG PO TABS: 10.0000 mg | ORAL_TABLET | Freq: Every day | ORAL | 0 refills | Status: DC

## 2022-10-24 MED ORDER — METOPROLOL SUCCINATE ER 100 MG PO TB24: 100.0000 mg | ORAL_TABLET | Freq: Every day | ORAL | 3 refills | Status: AC

## 2022-10-24 NOTE — Patient Instructions (Addendum)
Medication Instructions:  Your physician has recommended you make the following change in your medication:  1.) stop Lopressor (metoprolol tartrate) 2.) start Toprol XL (metoprolol succinate) 100 mg - once daily 3.) start Jardiance 10 mg - once daily  *If you need a refill on your cardiac medications before your next appointment, please call your pharmacy*   Lab Work: none   Testing/Procedures: echo in 3 months - same day of next appointment with Dr. Lynnette Caffey  Your physician has requested that you have an echocardiogram. Echocardiography is a painless test that uses sound waves to create images of your heart. It provides your doctor with information about the size and shape of your heart and how well your heart's chambers and valves are working. This procedure takes approximately one hour. There are no restrictions for this procedure. Please do NOT wear cologne, perfume, aftershave, or lotions (deodorant is allowed). Please arrive 15 minutes prior to your appointment time.   Follow Up: Your next appointment:   3 month(s) - echocardiogram same day  Provider:   Alverda Skeans, MD    Other Instructions Echo same day

## 2022-12-26 ENCOUNTER — Other Ambulatory Visit: Payer: Self-pay

## 2022-12-26 MED ORDER — EMPAGLIFLOZIN 10 MG PO TABS: 10.0000 mg | ORAL_TABLET | Freq: Every day | ORAL | 1 refills | Status: DC

## 2023-01-06 ENCOUNTER — Telehealth: Payer: Self-pay | Admitting: Internal Medicine

## 2023-01-06 ENCOUNTER — Other Ambulatory Visit: Payer: Self-pay | Admitting: Interventional Cardiology

## 2023-01-06 DIAGNOSIS — I4821 Permanent atrial fibrillation: Secondary | ICD-10-CM

## 2023-01-06 NOTE — Telephone Encounter (Signed)
Prescription refill request for Eliquis received. Indication: AF Last office visit: 10/24/22  A Thukkani MD Scr: 0.79 on 07/04/22  Epic Age: 72 Weight: 106.9kg  Based on above findings Eliquis 5mg  twice daily is the appropriate dose.  Refill approved.

## 2023-01-06 NOTE — Telephone Encounter (Signed)
Pt stated that he is waiting on his mail order to arrive and needed a week or 2 weeks until his mail order arrive. Pt stated that he could not get it from his local pharmacy because of his mail order sending it out for a 90 day supply. Please advise

## 2023-01-06 NOTE — Telephone Encounter (Signed)
Patient calling the office for samples of medication:   1.  What medication and dosage are you requesting samples for? Eliquis   2.  Are you currently out of this medication?   Yes, patient has been out of medication for 4 days.

## 2023-01-06 NOTE — Telephone Encounter (Signed)
Need to clarify why he is asking for samples. He has an active rx on file and med was sent to his pharmacy earlier today, he should pick up refill from his pharmacy.

## 2023-01-07 MED ORDER — APIXABAN 5 MG PO TABS: 5.0000 mg | ORAL_TABLET | Freq: Two times a day (BID) | ORAL | 0 refills | Status: DC

## 2023-01-07 NOTE — Telephone Encounter (Signed)
One week of Eliquis 5 mg samples left at front desk for patient to pick up.  Patient made aware

## 2023-01-07 NOTE — Telephone Encounter (Signed)
Mail order rx should get to pt in < 1 week, ok to give 1 week of samples while waiting for mail order rx to arrive. Thanks!

## 2023-01-07 NOTE — Telephone Encounter (Signed)
Patient called to follow-up on getting samples.
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
## 2023-01-30 ENCOUNTER — Encounter: Payer: Self-pay | Admitting: Internal Medicine

## 2023-01-30 ENCOUNTER — Ambulatory Visit (HOSPITAL_COMMUNITY): Payer: Medicare PPO | Attending: Internal Medicine

## 2023-01-30 ENCOUNTER — Telehealth: Payer: Self-pay

## 2023-01-30 ENCOUNTER — Ambulatory Visit (INDEPENDENT_AMBULATORY_CARE_PROVIDER_SITE_OTHER): Payer: Medicare PPO | Admitting: Internal Medicine

## 2023-01-30 VITALS — BP 122/70 | HR 59 | Ht 70.5 in | Wt 231.6 lb

## 2023-01-30 DIAGNOSIS — I340 Nonrheumatic mitral (valve) insufficiency: Secondary | ICD-10-CM

## 2023-01-30 DIAGNOSIS — I428 Other cardiomyopathies: Secondary | ICD-10-CM

## 2023-01-30 DIAGNOSIS — I4821 Permanent atrial fibrillation: Secondary | ICD-10-CM

## 2023-01-30 LAB — ECHOCARDIOGRAM COMPLETE
MV M vel: 5.09 m/s
MV Peak grad: 103.5 mmHg
S' Lateral: 5.5 cm

## 2023-01-30 MED ORDER — OMEPRAZOLE 20 MG PO CPDR: 20.0000 mg | DELAYED_RELEASE_CAPSULE | Freq: Every day | ORAL | 3 refills | Status: DC

## 2023-01-30 MED ORDER — EMPAGLIFLOZIN 10 MG PO TABS: 10.0000 mg | ORAL_TABLET | Freq: Every day | ORAL | 0 refills | Status: DC

## 2023-01-30 NOTE — Telephone Encounter (Signed)
Yes the omeprazole is delayed release.

## 2023-01-30 NOTE — Patient Instructions (Addendum)
Medication Instructions:  Your physician has recommended you make the following change in your medication:   Start omeprazole 20 mg daily  *If you need a refill on your cardiac medications before your next appointment, please call your pharmacy*   Labs: CBC Tuesday 9/3  Testing/Procedures: Cardiac MRI   Follow-Up: At [facility], you and your health needs are our priority.  As part of our continuing mission to provide you with exceptional heart care, we have created designated Provider Care Teams.  These Care Teams include your primary Cardiologist (physician) and Advanced Practice Providers (APPs -  Physician Assistants and Nurse Practitioners) who all work together to provide you with the care you need, when you need it.  We recommend signing up for the patient portal called "MyChart".  Sign up information is provided on this After Visit Summary.  MyChart is used to connect with patients for Virtual Visits (Telemedicine).  Patients are able to view lab/test results, encounter notes, upcoming appointments, etc.  Non-urgent messages can be sent to your provider as well.   To learn more about what you can do with MyChart, go to [email or web address].    Your next appointment:   To be determined   Provider:   Dr Lynnette Caffey   Other Instructions   You are scheduled for Cardiac MRI on ______________. Please arrive for your appointment at ______________ ( arrive 30-45 minutes prior to test start time). ?  [facility] [address] [place], [place] [address] [phone] Please take advantage of the free valet parking available at the [facility] (Entrance C).  Proceed to the [facility] Radiology Department (First Floor) for check-in.   OR   [facility] [address] [place], [place] [address] [phone] Please go to the [facility] and check-in with the desk attendant.   Magnetic resonance imaging  (MRI) is a painless test that produces images of the inside of the body without using Xrays.  During an MRI, strong magnets and radio waves work together in a Data processing manager to form detailed images.   MRI images may provide more details about a medical condition than X-rays, CT scans, and ultrasounds can provide.  You may be given earphones to listen for instructions.  You may eat a light breakfast and take medications as ordered with the exception of furosemide, hydrochlorothiazide, or spironolactone(fluid pill, other). Please avoid stimulants for 12 hr prior to test. (Ie. Caffeine, nicotine, chocolate, or antihistamine medications)  An IV will be inserted into one of your veins. Contrast material will be injected into your IV. It will leave your body through your urine within a day. You may be told to drink plenty of fluids to help flush the contrast material out of your system.  You will be asked to remove all metal, including: Watch, jewelry, and other metal objects including hearing aids, hair pieces and dentures. Also wearable glucose monitoring systems (ie. Freestyle Libre and Omnipods) (Braces and fillings normally are not a problem.)   TEST WILL TAKE APPROXIMATELY 1 HOUR  PLEASE NOTIFY SCHEDULING AT LEAST 24 HOURS IN ADVANCE IF YOU ARE UNABLE TO KEEP YOUR APPOINTMENT. [phone]  For more information and frequently asked questions, please visit our website : [email or web address]  Please call the Cardiac Imaging Nurse Navigators with any questions/concerns. [phone] Office

## 2023-01-30 NOTE — Telephone Encounter (Signed)
-----   Message from Orbie Pyo sent at 01/30/2023  1:34 PM EDT ----- Let him know that his valve is still leaky and we need a better look at it.  I would like to refer him for a TEE-CV with a structural reader.  Please let me know who it will be with.  We set him up with a CMR; let's make sure the TEE-CV is done before the CMR.

## 2023-01-30 NOTE — Telephone Encounter (Signed)
Spoke with patient to review message from pharmacy regarding his omeprazole.   Also advised since he will need additional lab work prior to his cardioversion I canceled his lab appointment for 02/03/23.  Patient verbalized understanding and had no questions.

## 2023-01-30 NOTE — Telephone Encounter (Signed)
Spoke with patient to discuss ECHO results and instructions from Dr Lynnette Caffey regarding having a Cardioversion.  Patient states he missed about a week of Eliquis approximately 2 weeks ago. He is not sure of exact dates and would prefer to wait the full 3 weeks before having the cardioversion procedure to be sure he is safe to proceed.   Patient also wanted to know if the omeprazole that was prescribed today is time released. He wants a time released medication. I will forward this concern to PharmD for recommendations.   Advised patient we would follow up with him next week regarding schedule the cardioversion. Patient verbalized understanding and had no questions.

## 2023-02-03 ENCOUNTER — Telehealth: Payer: Self-pay

## 2023-02-03 ENCOUNTER — Other Ambulatory Visit: Payer: Medicare PPO

## 2023-02-03 NOTE — Telephone Encounter (Signed)
-----   Message from Orbie Pyo sent at 01/31/2023  7:40 AM EDT ----- Please see addendum to my last clinic note.  I have changed my mind, no cardioversion or TEE.  Would like CMR to evaluate NICM and MR first.  Thanks and sorry for the confusion.  Let patient know the CMR will tell us how bad the valve is.

## 2023-02-03 NOTE — Telephone Encounter (Signed)
Attempted to reach patient regarding CMR, lab scheduling and to review notes from Dr Lynnette Caffey. Unable to leave message, voice mail is full.

## 2023-02-03 NOTE — Telephone Encounter (Signed)
Spoke with patient to advise of Dr Trula Ore instructions. Patient will proceed with CMR and he will come to the office on 9/5 for lab work.

## 2023-02-05 ENCOUNTER — Ambulatory Visit: Payer: Medicare PPO | Attending: Internal Medicine

## 2023-02-05 DIAGNOSIS — I428 Other cardiomyopathies: Secondary | ICD-10-CM

## 2023-02-05 DIAGNOSIS — I340 Nonrheumatic mitral (valve) insufficiency: Secondary | ICD-10-CM

## 2023-02-05 DIAGNOSIS — I4821 Permanent atrial fibrillation: Secondary | ICD-10-CM

## 2023-02-05 LAB — CBC
Hematocrit: 49.1 % (ref 37.5–51.0)
Hemoglobin: 16.4 g/dL (ref 13.0–17.7)
MCH: 30.1 pg (ref 26.6–33.0)
MCHC: 33.4 g/dL (ref 31.5–35.7)
MCV: 90 fL (ref 79–97)
Platelets: 156 10*3/uL (ref 150–450)
RBC: 5.44 x10E6/uL (ref 4.14–5.80)
RDW: 12.8 % (ref 11.6–15.4)
WBC: 8.5 10*3/uL (ref 3.4–10.8)

## 2023-02-24 ENCOUNTER — Telehealth: Payer: Self-pay | Admitting: Internal Medicine

## 2023-02-24 NOTE — Telephone Encounter (Signed)
Patient called stating he was returning a staff call regarding upcoming test.

## 2023-02-24 NOTE — Telephone Encounter (Signed)
Mailbox full

## 2023-02-27 NOTE — Telephone Encounter (Signed)
Appears since call all testing has been scheduled.

## 2023-03-02 ENCOUNTER — Other Ambulatory Visit (HOSPITAL_COMMUNITY): Payer: Self-pay | Admitting: Podiatry

## 2023-03-02 DIAGNOSIS — I739 Peripheral vascular disease, unspecified: Secondary | ICD-10-CM

## 2023-03-06 ENCOUNTER — Ambulatory Visit (HOSPITAL_COMMUNITY): Admission: RE | Admit: 2023-03-06 | Payer: Medicare PPO | Source: Ambulatory Visit

## 2023-03-06 ENCOUNTER — Encounter (HOSPITAL_COMMUNITY): Payer: Self-pay

## 2023-03-12 DIAGNOSIS — H903 Sensorineural hearing loss, bilateral: Secondary | ICD-10-CM | POA: Insufficient documentation

## 2023-03-12 DIAGNOSIS — H6123 Impacted cerumen, bilateral: Secondary | ICD-10-CM | POA: Insufficient documentation

## 2023-03-14 ENCOUNTER — Encounter: Payer: Self-pay | Admitting: Hematology & Oncology

## 2023-03-17 ENCOUNTER — Ambulatory Visit (HOSPITAL_COMMUNITY)
Admission: RE | Admit: 2023-03-17 | Discharge: 2023-03-17 | Disposition: A | Discharge status: Home / Self Care | Payer: Medicare HMO | Source: Ambulatory Visit | Attending: Podiatry | Admitting: Podiatry

## 2023-03-17 DIAGNOSIS — I739 Peripheral vascular disease, unspecified: Secondary | ICD-10-CM | POA: Insufficient documentation

## 2023-03-18 LAB — VAS US ABI WITH/WO TBI
Left ABI: 1.11
Right ABI: 1.04

## 2023-04-17 ENCOUNTER — Telehealth (HOSPITAL_COMMUNITY): Payer: Self-pay | Admitting: *Deleted

## 2023-04-17 NOTE — Telephone Encounter (Signed)
 Attempted to call patient regarding upcoming cardiac MRI appointment. Unable to leave VM (VM full).  Johney Frame RN Navigator Cardiac Imaging [facility] [phone] Office

## 2023-04-20 ENCOUNTER — Ambulatory Visit (HOSPITAL_COMMUNITY): Admission: RE | Admit: 2023-04-20 | Payer: Medicare HMO | Source: Ambulatory Visit

## 2023-04-25 ENCOUNTER — Other Ambulatory Visit: Payer: Self-pay | Admitting: Nurse Practitioner

## 2023-04-25 ENCOUNTER — Other Ambulatory Visit: Payer: Self-pay | Admitting: Interventional Cardiology

## 2023-04-28 ENCOUNTER — Telehealth: Payer: Self-pay

## 2023-04-28 NOTE — Telephone Encounter (Signed)
Mr. Reagle no-showed to his cMR appointment 04/20/2023. There have been several attempts to reach him, but his mailbox is full - unable to leave message.  Per Dr. Trula Ore 01/30/2023 office note, "ADDENDUM 01/31/23:  Patient TTE today with moderate to severe MR.  Has been in AF for >6 years with failed cardioversion in the past so unlikely to maintain NSR.  Had TEE 2022 which demonstrated severe MR.  Since that time, I have added GDMT (now on 4 drug regimen with Avapro, Toprol, spironolactone, and Jardiance).  Will refer for cardiac MRI to evaluate NICM further and assess severity of MR.  If MR improved, continue GDMT and refer to AHF.  If MR moderate to severe, continue GDMT, and refer to AHF for opinion regarding mTEER."  Letter sent to the patient to contact Structural Heart office.

## 2023-05-04 NOTE — Telephone Encounter (Signed)
The patient received letter over the weekend and called back.  Attempted to contact him again. Mailbox is full - unable to leave message. Will try later.

## 2023-05-13 ENCOUNTER — Telehealth: Payer: Self-pay | Admitting: Internal Medicine

## 2023-05-13 MED ORDER — APIXABAN 5 MG PO TABS: 5.0000 mg | ORAL_TABLET | Freq: Two times a day (BID) | ORAL | Status: DC

## 2023-05-13 MED ORDER — EMPAGLIFLOZIN 10 MG PO TABS: 10.0000 mg | ORAL_TABLET | Freq: Every day | ORAL | Status: DC

## 2023-05-13 NOTE — Telephone Encounter (Signed)
Samples of Jardiance and Eliquis placed at front desk.  I offered to wait for patient to come to office today but he wont have time tonight since he has a Nutritional therapist at his home currently.  Will come by tomorrow.

## 2023-05-13 NOTE — Telephone Encounter (Signed)
Patient calling the office for samples of medication:   1.  What medication and dosage are you requesting samples for? apixaban (ELIQUIS) 5 MG TABS tablet      empagliflozin (JARDIANCE) 10 MG TABS tablet  2.  Are you currently out of this medication?  Yes, patient states he has been out of medication. The price of both medications increased and he is unable to afford it.

## 2023-05-13 NOTE — Telephone Encounter (Signed)
Called and spoke w patient.  He is out of Eliquis completely.  Took his last dose yesterday and cannot afford to pick up one month's supply.  He is also out of Jardiance.  He is feeling really well.  He has a new insurance so that he will not have a problem paying for medicines starting in January.    He has a few concerns  - he received the no show letter for cMRI and states he never had a cardiac MRI ordered.  Tried to review in chart it was originally explained to patient in September.     - he does not have the Toprol XL on his med list any more.  He had been on 100 mg daily  - he had a test on circulation of his legs and it was not normal and he wanted to talk to Dr. Lynnette Caffey about that as well.  I scheduled him to come in Jan 9, offered sooner but patient is unable to come in Dec due to plans w Christmas, holidays.

## 2023-05-14 ENCOUNTER — Other Ambulatory Visit (HOSPITAL_COMMUNITY): Payer: Self-pay

## 2023-05-14 ENCOUNTER — Encounter: Payer: Self-pay | Admitting: Hematology & Oncology

## 2023-05-14 ENCOUNTER — Telehealth: Payer: Self-pay

## 2023-05-14 MED ORDER — EMPAGLIFLOZIN 10 MG PO TABS
10.0000 mg | ORAL_TABLET | Freq: Every day | ORAL | 3 refills | Status: DC
  Filled 2023-05-14: qty 30, 30d supply, fill #0

## 2023-05-14 NOTE — Addendum Note (Signed)
Addended by: Sharin Grave on: 05/14/2023 01:20 PM   Modules accepted: Orders

## 2023-05-14 NOTE — Telephone Encounter (Signed)
Please send in prescription for JARDIANCE to [facility]   [address] [place] [address]  (Pharmacy is on pt's snapshot)

## 2023-05-14 NOTE — Telephone Encounter (Signed)
Based on the fact that patient's plan is about to change, I don't think it makes sense to start a BMS application unless come January 1st, the copay is still an issue for patient. I can enroll patient in a grant now to cover the Jardiance.

## 2023-05-14 NOTE — Telephone Encounter (Signed)
RX for Jardiance 10 mg take (1) tablet po every day sent into [facility] as requested.

## 2023-05-14 NOTE — Telephone Encounter (Signed)
Patient Advocate Encounter   The patient was approved for a [employer] grant that will help cover the cost of JARDIANCE Total amount awarded, $10,000.  Effective: 04/14/23 - 04/12/24   [id number] [id number]   Pharmacy provided with approval and processing information. Pharmacy will reach out to patient once grant is processed on claim.   Haze Rushing, CPhT  Pharmacy Patient Advocate Specialist  Direct Number: [phone] Fax: [phone]

## 2023-06-02 ENCOUNTER — Other Ambulatory Visit: Payer: Self-pay | Admitting: Internal Medicine

## 2023-06-02 DIAGNOSIS — I4821 Permanent atrial fibrillation: Secondary | ICD-10-CM

## 2023-06-02 MED ORDER — EMPAGLIFLOZIN 10 MG PO TABS: 10.0000 mg | ORAL_TABLET | Freq: Every day | ORAL | 2 refills | Status: DC

## 2023-06-02 MED ORDER — AMLODIPINE BESYLATE 5 MG PO TABS: 5.0000 mg | ORAL_TABLET | Freq: Every day | ORAL | 2 refills | Status: DC

## 2023-06-02 MED ORDER — APIXABAN 5 MG PO TABS: 5.0000 mg | ORAL_TABLET | Freq: Two times a day (BID) | ORAL | 5 refills | Status: DC

## 2023-06-02 MED ORDER — IRBESARTAN 150 MG PO TABS: 150.0000 mg | ORAL_TABLET | Freq: Every day | ORAL | 2 refills | Status: DC

## 2023-06-02 MED ORDER — FUROSEMIDE 40 MG PO TABS: 40.0000 mg | ORAL_TABLET | Freq: Two times a day (BID) | ORAL | 2 refills | Status: DC

## 2023-06-02 MED ORDER — SPIRONOLACTONE 25 MG PO TABS: 25.0000 mg | ORAL_TABLET | Freq: Every day | ORAL | 2 refills | Status: DC

## 2023-06-02 MED ORDER — ATORVASTATIN CALCIUM 40 MG PO TABS: 40.0000 mg | ORAL_TABLET | Freq: Every day | ORAL | 2 refills | Status: DC

## 2023-06-02 NOTE — Telephone Encounter (Signed)
Pt's medications were sent to pt's pharmacy as requested. Confirmation received.  

## 2023-06-02 NOTE — Telephone Encounter (Signed)
Prescription refill request for Eliquis received. Indication: a fib Last office visit: 01/30/23 Scr: 0.79 07/04/22 epic Age:  72 Weight: 105kg

## 2023-06-02 NOTE — Telephone Encounter (Signed)
*  STAT* If patient is at the pharmacy, call can be transferred to refill team.   1. Which medications need to be refilled? (please list name of each medication and dose if known) amLODipine  (NORVASC ) 5 MG tablet  apixaban  (ELIQUIS ) 5 MG TABS tablet  atorvastatin  (LIPITOR) 40 MG tablet  empagliflozin  (JARDIANCE ) 10 MG TABS tablet  furosemide  (LASIX ) 40 MG tablet  irbesartan  (AVAPRO ) 150 MG tablet  spironolactone  (ALDACTONE ) 25 MG tablet  4. Which pharmacy/location (including street and city if local pharmacy) is medication to be sent to? [id number] - [place], [place] - [address] Phone: [phone]  Fax: [phone]       5. Do they need a 30 day or 90 day supply? 90

## 2023-06-04 ENCOUNTER — Telehealth: Payer: Self-pay | Admitting: Internal Medicine

## 2023-06-04 ENCOUNTER — Encounter: Payer: Self-pay | Admitting: Hematology & Oncology

## 2023-06-04 NOTE — Telephone Encounter (Signed)
 Patient states he has retired and his insurance has changed. He is asking for patient assistance for Eliquis or a more affordable option. I did inform patient he wil have to spend 3% of cost before assistance.   He would like to discuss other options

## 2023-06-04 NOTE — Telephone Encounter (Signed)
 Pt c/o medication issue:  1. Name of Medication:   apixaban  (ELIQUIS ) 5 MG TABS tablet   2. How are you currently taking this medication (dosage and times per day)?   As prescribed  3. Are you having a reaction (difficulty breathing--STAT)?   no  4. What is your medication issue?   Patient stated this medication is not covered by his insurance.  Patient wants to discuss assistance to get this medication, an alternate medication or next steps.

## 2023-06-05 ENCOUNTER — Encounter: Payer: Self-pay | Admitting: Hematology & Oncology

## 2023-06-05 ENCOUNTER — Other Ambulatory Visit (HOSPITAL_COMMUNITY): Payer: Self-pay

## 2023-06-05 NOTE — Telephone Encounter (Signed)
 Spoke with patient and he states he will discuss other options with provider at next visit

## 2023-06-05 NOTE — Telephone Encounter (Signed)
 Xarelto would be 47.00 copay. Pradaxa would be 97.26 copay. Brilinta would be a 47.00 copay.   It says he maybe eligible for [employer] patient assistance if: You are living in the [place]  or a [place] territory* during your eligibility period, and You are being treated by a U.S. licensed prescriber, and You are being treated with the medicine as an outpatient, and You do not have insurance coverage for a medicine listed on this site, and You were prescribed Eliquis  or Orencia, and your annual household income is at or below $45,180 for a single person or [phone] for a family of two. (Income limits are adjusted for larger families), or You were prescribed a BMS medicine for cancer, ulcerative colitis, multiple sclerosis, plaque psoriasis, obstructive hypertrophic cardiomyopathy, or kidney transplant (even if your income is higher than the limits listed above), and You are age 18 and over and you do not qualify for the Medicare Part D Low-Income Subsidy (LIS) program (also called "Extra Help"). Proof of denial will be required. You do not qualify for Medicaid. Medicaid-eligible patients will be required to submit proof of Medicaid denial.

## 2023-06-11 ENCOUNTER — Ambulatory Visit: Payer: Medicare (Managed Care) | Admitting: Internal Medicine
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
## 2023-07-24 ENCOUNTER — Ambulatory Visit: Payer: Medicare (Managed Care) | Attending: Internal Medicine | Admitting: Internal Medicine

## 2023-07-24 DIAGNOSIS — I4821 Permanent atrial fibrillation: Secondary | ICD-10-CM

## 2023-07-24 DIAGNOSIS — I2510 Atherosclerotic heart disease of native coronary artery without angina pectoris: Secondary | ICD-10-CM

## 2023-07-24 DIAGNOSIS — I700 Atherosclerosis of aorta: Secondary | ICD-10-CM

## 2023-07-24 DIAGNOSIS — E785 Hyperlipidemia, unspecified: Secondary | ICD-10-CM

## 2023-07-24 DIAGNOSIS — D6869 Other thrombophilia: Secondary | ICD-10-CM

## 2023-07-24 DIAGNOSIS — I428 Other cardiomyopathies: Secondary | ICD-10-CM

## 2023-07-24 DIAGNOSIS — I10 Essential (primary) hypertension: Secondary | ICD-10-CM

## 2023-07-24 DIAGNOSIS — I340 Nonrheumatic mitral (valve) insufficiency: Secondary | ICD-10-CM

## 2023-07-27 ENCOUNTER — Encounter: Payer: Self-pay | Admitting: Internal Medicine

## 2023-09-14 ENCOUNTER — Telehealth: Payer: Self-pay | Admitting: *Deleted

## 2023-09-14 ENCOUNTER — Encounter: Payer: Self-pay | Admitting: Hematology & Oncology

## 2023-09-14 NOTE — Telephone Encounter (Signed)
 I called the pt's phone and the pt's wife's phone as well as left detailed message that pt needs an appt in office with Dr. Lorie Rook per the preop APP Palmer Bobo, NP. I left message to please call back ASAP in the morning to either confirm the appt or change the appt though there were no openings with Dr. Lorie Rook in time for his procedure. If pt does not call back in a timely manner the appt will need to be cancelled.  Manley Seeds RN gave the ok to use 7 day slot on 09/28/23 @ 2:40.

## 2023-09-14 NOTE — Telephone Encounter (Signed)
 Will check with preop APP if the appt has to be with Dr. Lorie Rook or can the pt see APP.

## 2023-09-14 NOTE — Telephone Encounter (Signed)
   Pre-operative Risk Assessment    Patient Name: [name]  DOB: March 03, 1951 MRN: [id number]   Date of last office visit: 01/30/23 DR. THUKKANI Date of next office visit: NONE   Request for Surgical Clearance    Procedure:   COLONOSCOPY  Date of Surgery:  Clearance 10/19/23                                Surgeon:  DR. [facility] Surgeon's Group or Practice Name:  [facility] Phone number:  [phone] Fax number:  [phone]   Type of Clearance Requested:   - Medical  - Pharmacy:  Hold Apixaban (Eliquis) x 2 DAYS PRIOR   Type of Anesthesia:   PROPOFOL   Additional requests/questions:    Signed, Evalise Abruzzese   09/14/2023, 3:43 PM

## 2023-09-14 NOTE — Telephone Encounter (Signed)
   Name: [name]  DOB: 02-24-1951  MRN: [id number]  Primary Cardiologist: Avery Bodo, MD  Chart reviewed as part of pre-operative protocol coverage. Because of [name] past medical history and time since last visit, he will require a follow-up in-office visit with Dr. Lorie Rook in order to better assess preoperative cardiovascular risk.  Pre-op covering staff: - Please schedule appointment and call patient to inform them. If patient already had an upcoming appointment within acceptable timeframe, please add "pre-op clearance" to the appointment notes so provider is aware. - Please contact requesting surgeon's office via preferred method (i.e, phone, fax) to inform them of need for appointment prior to surgery.  This message will also be routed to pharmacy pool for input on holding Eliquis as requested below so that this information is available to the clearing provider at time of patient's appointment.   Ava Boatman, NP  09/14/2023, 4:22 PM

## 2023-09-14 NOTE — Telephone Encounter (Signed)
 Patient with diagnosis of A Fib on Eliquis for anticoagulation.    Procedure: colonoscopy Date of procedure: 10/19/23   CHA2DS2-VASc Score = 4  This indicates a 4.8% annual risk of stroke. The patient's score is based upon: CHF History: 1 HTN History: 1 Diabetes History: 0 Stroke History: 0 Vascular Disease History: 1 Age Score: 1 Gender Score: 0     CrCl Overdue Platelet count 156K   Patient needs updated BMP  **This guidance is not considered finalized until pre-operative APP has relayed final recommendations.**

## 2023-09-15 NOTE — Telephone Encounter (Signed)
 Lvm to call back to confirm appointment that was scheduled

## 2023-09-16 NOTE — Telephone Encounter (Addendum)
 Left message x 3 for the pt to please call our office back and confirm the appt that is in place for him with Dr. Lorie Rook, see previous notes.    I will update Dr. Feliberto Hopping as well pt needs to have an appt with Dr. Lorie Rook and we have scheduling him and have tried to reach the pt to confirm appt. Dr. Lorie Rook does not have any other appt in time before procedure.

## 2023-09-18 ENCOUNTER — Encounter: Payer: Self-pay | Admitting: Hematology & Oncology

## 2023-09-25 NOTE — Progress Notes (Deleted)
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 panel, LFTs, LP(a) today.*** Aortic atherosclerosis: Continue Eliquis  5 mg twice daily and atorvastatin  40 mg daily. Hypertension: Continue amlodipine  5 mg daily, irbesartan  50 mg daily, Toprol -XL 100 mg daily, and spironolactone  25 mg daily. Preoperative cardiovascular examination: Patient may proceed with colonoscopy, hold Eliquis  3 days prior to procedure.***  I have personally reviewed the patients imaging data as summarized above.  I have reviewed the natural history of mitral regurgitation with the patient and family members who are present today. We have discussed the limitations of medical therapy and the poor prognosis associated with symptomatic mitral regurgitation. We have also reviewed potential treatment options, including palliative medical therapy, conventional mitral surgery, and transcatheter mitral edge-to-edge repair. We discussed treatment options in the context of this patient's specific comorbid medical  conditions.   All of the patient's questions were answered today. Will make further recommendations based on the results of studies outlined above.   I spent *** minutes reviewing all clinical data during and prior to this visit including all relevant imaging studies, laboratories, clinical information from other health systems and prior notes from both Cardiology and other specialties, interviewing the patient, conducting a complete physical examination, and coordinating care in order to formulate a comprehensive and personalized evaluation and treatment plan.   Slaton Reaser K Wright Gravely, MD  09/25/2023 9:36 AM    [facility] [address] [place], [place], [place]  [address] Phone: [phone]; Fax: [phone]

## 2023-09-28 ENCOUNTER — Ambulatory Visit: Payer: Medicare (Managed Care) | Admitting: Internal Medicine

## 2023-09-28 DIAGNOSIS — I4821 Permanent atrial fibrillation: Secondary | ICD-10-CM

## 2023-09-28 DIAGNOSIS — I10 Essential (primary) hypertension: Secondary | ICD-10-CM

## 2023-09-28 DIAGNOSIS — E785 Hyperlipidemia, unspecified: Secondary | ICD-10-CM

## 2023-09-28 DIAGNOSIS — Z01810 Encounter for preprocedural cardiovascular examination: Secondary | ICD-10-CM

## 2023-09-28 DIAGNOSIS — I428 Other cardiomyopathies: Secondary | ICD-10-CM

## 2023-09-28 DIAGNOSIS — I340 Nonrheumatic mitral (valve) insufficiency: Secondary | ICD-10-CM

## 2023-09-28 DIAGNOSIS — D6869 Other thrombophilia: Secondary | ICD-10-CM

## 2023-09-28 DIAGNOSIS — I447 Left bundle-branch block, unspecified: Secondary | ICD-10-CM

## 2023-09-28 DIAGNOSIS — I700 Atherosclerosis of aorta: Secondary | ICD-10-CM

## 2023-10-06 NOTE — Progress Notes (Unsigned)
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 Eliquis  3 days prior to procedure.***  I have personally reviewed the patients imaging data as summarized above.  I have reviewed the natural history of mitral regurgitation with the patient and family members who are present today. We have discussed the limitations of medical therapy and the poor prognosis associated with symptomatic mitral regurgitation. We have also reviewed potential treatment options, including palliative medical therapy, conventional mitral surgery, and transcatheter mitral edge-to-edge repair. We discussed treatment options in the context of this patient's specific comorbid medical conditions.   All of the patient's questions were answered today. Will make further recommendations based on the results of studies outlined above.   I spent *** minutes reviewing all clinical data during and prior to this visit including all relevant imaging studies, laboratories, clinical information from other health systems and  prior notes from both Cardiology and other specialties, interviewing the patient, conducting a complete physical examination, and coordinating care in order to formulate a comprehensive and personalized evaluation and treatment plan.   Tanor Glaspy K Rosaleah Person, MD  10/06/2023 12:09 PM    [facility] [address] [place], [place], [place]  [address] Phone: [phone]; Fax: [phone]

## 2023-10-07 ENCOUNTER — Other Ambulatory Visit: Payer: Self-pay

## 2023-10-07 ENCOUNTER — Encounter: Payer: Self-pay | Admitting: Internal Medicine

## 2023-10-07 ENCOUNTER — Ambulatory Visit: Attending: Internal Medicine | Admitting: Internal Medicine

## 2023-10-07 VITALS — BP 104/62 | HR 88 | Ht 70.5 in | Wt 249.4 lb

## 2023-10-07 DIAGNOSIS — I10 Essential (primary) hypertension: Secondary | ICD-10-CM

## 2023-10-07 DIAGNOSIS — I428 Other cardiomyopathies: Secondary | ICD-10-CM | POA: Diagnosis not present

## 2023-10-07 DIAGNOSIS — Z01818 Encounter for other preprocedural examination: Secondary | ICD-10-CM

## 2023-10-07 DIAGNOSIS — I4821 Permanent atrial fibrillation: Secondary | ICD-10-CM | POA: Diagnosis not present

## 2023-10-07 DIAGNOSIS — I340 Nonrheumatic mitral (valve) insufficiency: Secondary | ICD-10-CM | POA: Diagnosis not present

## 2023-10-07 DIAGNOSIS — I447 Left bundle-branch block, unspecified: Secondary | ICD-10-CM

## 2023-10-07 DIAGNOSIS — E785 Hyperlipidemia, unspecified: Secondary | ICD-10-CM

## 2023-10-07 DIAGNOSIS — I700 Atherosclerosis of aorta: Secondary | ICD-10-CM

## 2023-10-07 DIAGNOSIS — D6869 Other thrombophilia: Secondary | ICD-10-CM

## 2023-10-07 NOTE — Patient Instructions (Addendum)
 Medication Instructions:  Your physician recommends that you continue on your current medications as directed. Please refer to the Current Medication list given to you today.  *If you need a refill on your cardiac medications before your next appointment, please call your pharmacy*  Lab Work: TODAY: LIPIDS, LFTS, LP(a) If you have labs (blood work) drawn today and your tests are completely normal, you will receive your results only by: MyChart Message (if you have MyChart) OR A paper copy in the mail If you have any lab test that is abnormal or we need to change your treatment, we will call you to review the results.  Testing/Procedures: Your physician has requested that you have an echocardiogram. Echocardiography is a painless test that uses sound waves to create images of your heart. It provides your doctor with information about the size and shape of your heart and how well your heart's chambers and valves are working. This procedure takes approximately one hour. There are no restrictions for this procedure.  Please do NOT wear cologne, perfume, aftershave, or lotions (deodorant is allowed). Please arrive 15 minutes prior to your appointment time.  Please note: We ask at that you not bring children with you during ultrasound (echo/ vascular) testing. Due to room size and safety concerns, children are not allowed in the ultrasound rooms during exams. Our front office staff cannot provide observation of children in our lobby area while testing is being conducted. An adult accompanying a patient to their appointment will only be allowed in the ultrasound room at the discretion of the ultrasound technician under special circumstances. We apologize for any inconvenience.   Follow-Up: At [facility], you and your health needs are our priority.  As part of our continuing mission to provide you with exceptional heart care, our providers are all part of one team.  This team includes your  primary Cardiologist (physician) and Advanced Practice Providers or APPs (Physician Assistants and Nurse Practitioners) who all work together to provide you with the care you need, when you need it.  Your next appointment:   6 month(s)  Provider:   Dr. Lorie Rook  We recommend signing up for the patient portal called "MyChart".  Sign up information is provided on this After Visit Summary.  MyChart is used to connect with patients for Virtual Visits (Telemedicine).  Patients are able to view lab/test results, encounter notes, upcoming appointments, etc.  Non-urgent messages can be sent to your provider as well.   To learn more about what you can do with MyChart, go to [email or web address].   Other Instructions

## 2023-10-08 LAB — HEPATIC FUNCTION PANEL
ALT: 28 IU/L (ref 0–44)
AST: 27 IU/L (ref 0–40)
Albumin: 4.5 g/dL (ref 3.9–4.9)
Alkaline Phosphatase: 93 IU/L (ref 44–121)
Bilirubin Total: 0.6 mg/dL (ref 0.0–1.2)
Bilirubin, Direct: 0.2 mg/dL (ref 0.00–0.40)
Total Protein: 6.8 g/dL (ref 6.0–8.5)

## 2023-10-08 LAB — LIPID PANEL
Chol/HDL Ratio: 3.9 ratio (ref 0.0–5.0)
Cholesterol, Total: 140 mg/dL (ref 100–199)
HDL: 36 mg/dL — ABNORMAL LOW (ref >39)
LDL Chol Calc (NIH): 52 mg/dL (ref 0–99)
Triglycerides: 340 mg/dL — ABNORMAL HIGH (ref 0–149)
VLDL Cholesterol Cal: 52 mg/dL — ABNORMAL HIGH (ref 5–40)

## 2023-10-08 LAB — LIPOPROTEIN A (LPA): Lipoprotein (a): <8.4 nmol/L (ref <75.0)

## 2023-10-09 NOTE — Telephone Encounter (Signed)
 Requesting office called in about clearance. She states Dr. Lorie Rook notes say okay to hold 3 days prior, however that are requesting only 1-2 days prior. They asked for clarity if that is okay and for clearance to be faxed back over to them.

## 2023-10-09 NOTE — Telephone Encounter (Signed)
 Will forward to Pre-op APP, see notes below.

## 2023-10-09 NOTE — Telephone Encounter (Signed)
 Pre-op evaluation and recommendations for holding Eliquis  provided by Dr. Lorie Rook on 5/7 and outline in his note. Office note was faxed to requesting office. No further recommendations.  Lonell Rives. Dewan Emond, DNP, NP-C  10/09/2023, 9:40 AM [facility] [address] Office [phone] Fax [phone]

## 2023-10-14 ENCOUNTER — Ambulatory Visit: Payer: Self-pay

## 2023-11-12 ENCOUNTER — Ambulatory Visit (HOSPITAL_COMMUNITY)

## 2023-12-23 ENCOUNTER — Ambulatory Visit (HOSPITAL_COMMUNITY)
Admission: RE | Admit: 2023-12-23 | Discharge: 2023-12-23 | Disposition: A | Discharge status: Home / Self Care | Source: Ambulatory Visit | Attending: Internal Medicine | Admitting: Internal Medicine

## 2023-12-23 ENCOUNTER — Telehealth: Payer: Self-pay

## 2023-12-23 ENCOUNTER — Encounter (HOSPITAL_COMMUNITY): Payer: Self-pay

## 2023-12-23 ENCOUNTER — Telehealth: Payer: Self-pay | Admitting: Pharmacy Technician

## 2023-12-23 DIAGNOSIS — I428 Other cardiomyopathies: Secondary | ICD-10-CM | POA: Insufficient documentation

## 2023-12-23 LAB — ECHOCARDIOGRAM COMPLETE
AR max vel: 2.55 cm2
AV Area VTI: 2.53 cm2
AV Area mean vel: 2.27 cm2
AV Mean grad: 8.5 mmHg
AV Peak grad: 14.7 mmHg
Ao pk vel: 1.92 m/s
Area-P 1/2: 3.24 cm2
Calc EF: 51.3 %
MV M vel: 4.8 m/s
MV Peak grad: 92 mmHg
S' Lateral: 4.7 cm
Single Plane A2C EF: 59.9 %
Single Plane A4C EF: 45.6 %

## 2023-12-23 NOTE — Telephone Encounter (Signed)
 Uploaded provider portion to be completed in media

## 2023-12-23 NOTE — Telephone Encounter (Signed)
 Patient dropped off Patient Asst forms.

## 2023-12-23 NOTE — Telephone Encounter (Signed)
 PAP: Application for Eliquis has been submitted to [employer] ([employer]), via fax

## 2023-12-29 ENCOUNTER — Other Ambulatory Visit: Payer: Self-pay | Admitting: Internal Medicine

## 2023-12-29 DIAGNOSIS — I4821 Permanent atrial fibrillation: Secondary | ICD-10-CM

## 2023-12-29 NOTE — Telephone Encounter (Addendum)
 Eliquis  5mg  refill request received. Patient is 73 years old, weight-113.1kg, Crea-0.79 on 07/04/22-needs labs, Diagnosis-Afib, and last seen by Dr. Wendel on 10/07/23. Dose is appropriate based on dosing criteria.   Will need to call PCP to see if any labs   Spoke with PCP and inquired about recent/updated labs. She will fax them over from 08/2023. Will await   Labs received Crea-0.63 on 08/24/23, will have scanned into chart and will send in refill to requested pharmacy.

## 2024-01-06 ENCOUNTER — Telehealth: Payer: Self-pay | Admitting: Internal Medicine

## 2024-01-06 NOTE — Telephone Encounter (Signed)
 Pt called and wanted to speak to nurse about the Echo Test results.  He wanted Dr to know how well he was doing and had a couple of questions about future appt    [phone]

## 2024-01-06 NOTE — Telephone Encounter (Signed)
 Called patient left message on personal voice mail to call back.

## 2024-01-07 NOTE — Telephone Encounter (Signed)
 Left message for pt to call.

## 2024-01-08 NOTE — Telephone Encounter (Signed)
 Spoke with patient regarding his echocardiogram results. Patient advised to continue current medications and confirmed follow up appointment with Dr. Wendel. Patient voiced understanding.

## 2024-01-11 NOTE — Telephone Encounter (Signed)
 Signed provider form faxed to RX patient assist team.

## 2024-01-11 NOTE — Telephone Encounter (Signed)
 Filled out the remainder and faxed to [employer]

## 2024-01-13 NOTE — Telephone Encounter (Signed)
  See chart media

## 2024-01-13 NOTE — Telephone Encounter (Signed)
 Address was on provider form- refaxed along with adding address onto fax that was sent requesting it

## 2024-01-19 NOTE — Telephone Encounter (Signed)
 PAP: Patient has been denied for patient assistance by [employer] ([employer]) due to see below:

## 2024-01-20 ENCOUNTER — Ambulatory Visit: Admitting: Internal Medicine

## 2024-02-03 ENCOUNTER — Other Ambulatory Visit (HOSPITAL_COMMUNITY): Payer: Self-pay

## 2024-02-03 ENCOUNTER — Encounter: Payer: Self-pay | Admitting: Hematology & Oncology

## 2024-02-03 NOTE — Telephone Encounter (Signed)
 Patient can now use [employer] grant to cover Eliquis :  Bin: [id number] PCN: [id number] Group: [id number]  ID: [id number]  Valid 04/14/23- 04/12/24  Tried to call patient with no answer. Sent information to pharmacy

## 2024-02-25 ENCOUNTER — Other Ambulatory Visit: Payer: Self-pay | Admitting: Internal Medicine

## 2024-04-25 ENCOUNTER — Ambulatory Visit: Admitting: Internal Medicine

## 2024-06-28 NOTE — Progress Notes (Unsigned)
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 minimal (Grade I) atheroma plaque involving the transverse and descending aorta.  IAS/Shunts: No atrial level shunt detected by color flow Doppler. Agitated saline contrast was given intravenously to evaluate for intracardiac shunting. Agitated saline contrast bubble study was positive with shunting observed after >6 cardiac cycles suggestive of tiny intrapulmonary shunting.  EKG: Rhythm strip during this exam demostrated atrial fibrillation and rapid ventricular response.   LEFT VENTRICLE PLAX 2D LVOT diam:     2.19 cm LV SV:         52 LV SV Index:   23 LVOT Area:     3.76 cm   AORTIC VALVE LVOT Vmax:   92.64 cm/s LVOT Vmean:  51.733 cm/s LVOT VTI:    0.138 m  AORTA Ao Root diam: 3.30 cm Ao Asc diam:  3.10 cm  MR Peak grad:    130.4 mmHg MR Mean grad:    81.0 mmHg    SHUNTS MR Vmax:         571.00 cm/s  Systemic VTI:  0.14 m MR Vmean:        412.0 cm/s   Systemic Diam: 2.19 cm MR  PISA:         9.05 cm MR PISA Eff ROA: 63 mm MR PISA Radius:  1.20 cm  Soyla Merck MD Electronically signed by Soyla Merck MD Signature Date/Time: 03/16/2021/1:41:27 PM    Final        ______________________________________________________________________________________________      10/07/2023: ALT 28  STS RISK CALCULATOR: Pending  NHYA CLASS: 1     ASSESSMENT AND PLAN:   1. Nonrheumatic mitral valve regurgitation   2. NICM (nonischemic cardiomyopathy) (HCC)   3. LBBB (left bundle branch block)   4. Permanent atrial fibrillation (HCC)   5. Hypercoagulable state due to permanent atrial fibrillation (HCC)   6. Hyperlipidemia LDL goal <70   7. Aortic atherosclerosis   8. Essential hypertension       MR: TTE?  *** Nonischemic cardiomyopathy: Continue Jardiance  10 mg daily, Toprol  100 mg daily, irbesartan  150 mg daily, Lasix  40 mg daily, spironolactone  25 mg daily.  Left bundle branch block:  If EF is down may need to consider CRT Permanent atrial fibrillation: Continue Eliquis  5 mg twice daily, Toprol  XL 100 mg daily. Secondary hypercoagulable state: Continue Eliquis  5 mg twice daily Hyperlipidemia: Continue atorvastatin  40 mg daily; LDL was 52 within the last 6 months Aortic atherosclerosis: Continue Eliquis  5 mg twice daily and atorvastatin  40 mg daily. Hypertension: Continue amlodipine  5 mg daily, irbesartan  50 mg daily, Toprol -XL 100 mg daily, and spironolactone  25 mg daily.***   I have personally reviewed the patients imaging data as summarized above.  I have reviewed the natural history of mitral regurgitation with the patient and family members who are present today. We have discussed the limitations of medical therapy and the poor prognosis associated with symptomatic mitral regurgitation. We have also reviewed potential treatment options, including palliative medical therapy, conventional mitral surgery, and transcatheter mitral edge-to-edge  repair. We discussed treatment options in the context of this patient's specific comorbid medical conditions.   All of the patient's questions were answered today. Will make further recommendations based on the results of studies outlined above.   I spent 38 minutes reviewing all clinical data during and prior to this visit including all relevant imaging studies, laboratories, clinical information from other health systems and prior notes from both Cardiology and other specialties, interviewing the patient, conducting a complete physical examination, and coordinating care in order to formulate a comprehensive and personalized evaluation and treatment plan.   Corwyn Vora K Christmas Faraci, MD  06/28/2024 2:05 PM    [facility] [address] [place], [place], [place]  [address] Phone: [phone]; Fax: [phone]  "

## 2024-07-04 ENCOUNTER — Ambulatory Visit: Admitting: Internal Medicine

## 2024-07-04 DIAGNOSIS — I4821 Permanent atrial fibrillation: Secondary | ICD-10-CM

## 2024-07-04 DIAGNOSIS — I10 Essential (primary) hypertension: Secondary | ICD-10-CM

## 2024-07-04 DIAGNOSIS — E785 Hyperlipidemia, unspecified: Secondary | ICD-10-CM

## 2024-07-04 DIAGNOSIS — I700 Atherosclerosis of aorta: Secondary | ICD-10-CM

## 2024-07-04 DIAGNOSIS — I447 Left bundle-branch block, unspecified: Secondary | ICD-10-CM

## 2024-07-04 DIAGNOSIS — I340 Nonrheumatic mitral (valve) insufficiency: Secondary | ICD-10-CM

## 2024-07-04 DIAGNOSIS — I428 Other cardiomyopathies: Secondary | ICD-10-CM

## 2024-07-07 NOTE — Progress Notes (Unsigned)
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 is minimal (Grade I) atheroma plaque involving the transverse and descending aorta.  IAS/Shunts: No atrial level shunt detected by color flow Doppler. Agitated saline contrast was given intravenously to evaluate for intracardiac shunting. Agitated saline contrast bubble study was positive with shunting observed after >6 cardiac cycles suggestive of tiny intrapulmonary shunting.  EKG: Rhythm strip during this exam demostrated atrial fibrillation and rapid ventricular response.   LEFT VENTRICLE PLAX 2D LVOT diam:     2.19 cm LV SV:         52 LV SV Index:   23 LVOT Area:     3.76 cm   AORTIC VALVE LVOT Vmax:   92.64 cm/s LVOT Vmean:  51.733 cm/s LVOT VTI:    0.138 m  AORTA Ao Root diam: 3.30 cm Ao Asc diam:  3.10 cm  MR Peak grad:    130.4 mmHg MR Mean grad:    81.0 mmHg    SHUNTS MR Vmax:         571.00 cm/s  Systemic VTI:  0.14 m MR Vmean:        412.0 cm/s   Systemic Diam: 2.19 cm MR  PISA:         9.05 cm MR PISA Eff ROA: 63 mm MR PISA Radius:  1.20 cm  Soyla Merck MD Electronically signed by Soyla Merck MD Signature Date/Time: 03/16/2021/1:41:27 PM    Final        ______________________________________________________________________________________________      10/07/2023: ALT 28  STS RISK CALCULATOR: Pending  NHYA CLASS: 1     ASSESSMENT AND PLAN:   1. Nonrheumatic mitral valve regurgitation   2. NICM (nonischemic cardiomyopathy) (HCC)   3. LBBB (left bundle branch block)   4. Permanent atrial fibrillation (HCC)   5. Hypercoagulable state due to permanent atrial fibrillation (HCC)   6. Hyperlipidemia LDL goal <70   7. Aortic atherosclerosis   8. Essential hypertension        MR: TTE?  *** Nonischemic cardiomyopathy: Continue Jardiance  10 mg daily, Toprol  100 mg daily, irbesartan  150 mg daily, Lasix  40 mg daily, spironolactone  25 mg daily.  Left bundle branch block:  If EF is down may need to consider CRT Permanent atrial fibrillation: Continue Eliquis  5 mg twice daily, Toprol  XL 100 mg daily. Secondary hypercoagulable state: Continue Eliquis  5 mg twice daily Hyperlipidemia: Continue atorvastatin  40 mg daily; LDL was 52 within the last 6 months Aortic atherosclerosis: Continue Eliquis  5 mg twice daily and atorvastatin  40 mg daily. Hypertension: Continue amlodipine  5 mg daily, irbesartan  50 mg daily, Toprol -XL 100 mg daily, and spironolactone  25 mg daily.***   I have personally reviewed the patients imaging data as summarized above.  I have reviewed the natural history of mitral regurgitation with the patient and family members who are present today. We have discussed the limitations of medical therapy and the poor prognosis associated with symptomatic mitral regurgitation. We have also reviewed potential treatment options, including palliative medical therapy, conventional mitral surgery, and transcatheter mitral edge-to-edge  repair. We discussed treatment options in the context of this patient's specific comorbid medical conditions.   All of the patient's questions were answered today. Will make further recommendations based on the results of studies outlined above.   I spent 38 minutes reviewing all clinical data during and prior to this visit including all relevant imaging studies, laboratories, clinical information from other health systems and prior notes from both Cardiology and other specialties, interviewing the patient, conducting a complete physical examination, and coordinating care in order to formulate a comprehensive and personalized evaluation and treatment plan.   Kahlan Engebretson K Malikah Lakey, MD  07/07/2024 4:02 PM    [facility] [address] [place], [place], [place]  [address] Phone: [phone]; Fax: [phone]  "

## 2024-07-18 ENCOUNTER — Ambulatory Visit: Admitting: Internal Medicine

## 2024-07-18 DIAGNOSIS — I340 Nonrheumatic mitral (valve) insufficiency: Secondary | ICD-10-CM

## 2024-07-18 DIAGNOSIS — E785 Hyperlipidemia, unspecified: Secondary | ICD-10-CM

## 2024-07-18 DIAGNOSIS — I10 Essential (primary) hypertension: Secondary | ICD-10-CM

## 2024-07-18 DIAGNOSIS — I700 Atherosclerosis of aorta: Secondary | ICD-10-CM

## 2024-07-18 DIAGNOSIS — D6869 Other thrombophilia: Secondary | ICD-10-CM

## 2024-07-18 DIAGNOSIS — I4821 Permanent atrial fibrillation: Secondary | ICD-10-CM

## 2024-07-18 DIAGNOSIS — I428 Other cardiomyopathies: Secondary | ICD-10-CM

## 2024-07-18 DIAGNOSIS — I447 Left bundle-branch block, unspecified: Secondary | ICD-10-CM
